# Patient Record
Sex: Male | Born: 1940 | Race: White | Hispanic: No | Marital: Married | Smoking: Never smoker
Health system: Southern US, Community
[De-identification: ages and names within clinical notes are randomized; demographics above are authoritative.]

## PROBLEM LIST (undated history)

## (undated) DIAGNOSIS — Z86010 Personal history of colonic polyps: Principal | ICD-10-CM

## (undated) DIAGNOSIS — T7840XA Allergy, unspecified, initial encounter: Secondary | ICD-10-CM

## (undated) DIAGNOSIS — M1990 Unspecified osteoarthritis, unspecified site: Secondary | ICD-10-CM

## (undated) DIAGNOSIS — I639 Cerebral infarction, unspecified: Secondary | ICD-10-CM

## (undated) DIAGNOSIS — I499 Cardiac arrhythmia, unspecified: Secondary | ICD-10-CM

## (undated) DIAGNOSIS — E039 Hypothyroidism, unspecified: Secondary | ICD-10-CM

## (undated) DIAGNOSIS — F419 Anxiety disorder, unspecified: Secondary | ICD-10-CM

## (undated) DIAGNOSIS — K219 Gastro-esophageal reflux disease without esophagitis: Secondary | ICD-10-CM

## (undated) DIAGNOSIS — I509 Heart failure, unspecified: Secondary | ICD-10-CM

## (undated) DIAGNOSIS — I10 Essential (primary) hypertension: Secondary | ICD-10-CM

## (undated) DIAGNOSIS — E785 Hyperlipidemia, unspecified: Secondary | ICD-10-CM

## (undated) DIAGNOSIS — I493 Ventricular premature depolarization: Secondary | ICD-10-CM

## (undated) DIAGNOSIS — Q159 Congenital malformation of eye, unspecified: Secondary | ICD-10-CM

## (undated) DIAGNOSIS — E079 Disorder of thyroid, unspecified: Secondary | ICD-10-CM

## (undated) DIAGNOSIS — F32A Depression, unspecified: Secondary | ICD-10-CM

## (undated) DIAGNOSIS — C801 Malignant (primary) neoplasm, unspecified: Secondary | ICD-10-CM

## (undated) DIAGNOSIS — K200 Eosinophilic esophagitis: Secondary | ICD-10-CM

## (undated) DIAGNOSIS — K5790 Diverticulosis of intestine, part unspecified, without perforation or abscess without bleeding: Secondary | ICD-10-CM

## (undated) DIAGNOSIS — I420 Dilated cardiomyopathy: Secondary | ICD-10-CM

## (undated) DIAGNOSIS — F329 Major depressive disorder, single episode, unspecified: Secondary | ICD-10-CM

## (undated) HISTORY — DX: Major depressive disorder, single episode, unspecified: F32.9

## (undated) HISTORY — PX: APPENDECTOMY: SHX54

## (undated) HISTORY — DX: Anxiety disorder, unspecified: F41.9

## (undated) HISTORY — PX: COLONOSCOPY: SHX174

## (undated) HISTORY — DX: Depression, unspecified: F32.A

## (undated) HISTORY — DX: Gastro-esophageal reflux disease without esophagitis: K21.9

## (undated) HISTORY — DX: Personal history of colonic polyps: Z86.010

## (undated) HISTORY — DX: Cerebral infarction, unspecified: I63.9

## (undated) HISTORY — DX: Essential (primary) hypertension: I10

## (undated) HISTORY — DX: Hyperlipidemia, unspecified: E78.5

## (undated) HISTORY — PX: TONSILLECTOMY: SUR1361

## (undated) HISTORY — PX: SHOULDER ARTHROSCOPY: SHX128

## (undated) HISTORY — DX: Eosinophilic esophagitis: K20.0

## (undated) HISTORY — DX: Congenital malformation of eye, unspecified: Q15.9

## (undated) HISTORY — PX: SHOULDER SURGERY: SHX246

## (undated) HISTORY — DX: Allergy, unspecified, initial encounter: T78.40XA

## (undated) HISTORY — DX: Dilated cardiomyopathy: I42.0

## (undated) HISTORY — DX: Unspecified osteoarthritis, unspecified site: M19.90

## (undated) HISTORY — PX: KNEE ARTHROSCOPY: SUR90

## (undated) HISTORY — DX: Disorder of thyroid, unspecified: E07.9

---

## 1999-07-25 ENCOUNTER — Emergency Department (HOSPITAL_COMMUNITY): Admission: EM | Admit: 1999-07-25 | Discharge: 1999-07-25 | Payer: Self-pay | Admitting: Emergency Medicine

## 1999-12-02 ENCOUNTER — Encounter: Admission: RE | Admit: 1999-12-02 | Discharge: 1999-12-02 | Payer: Self-pay | Admitting: Sports Medicine

## 2006-02-23 ENCOUNTER — Emergency Department (HOSPITAL_COMMUNITY): Admission: EM | Admit: 2006-02-23 | Discharge: 2006-02-23 | Payer: Self-pay | Admitting: Emergency Medicine

## 2006-02-26 ENCOUNTER — Ambulatory Visit (HOSPITAL_COMMUNITY): Admission: RE | Admit: 2006-02-26 | Discharge: 2006-02-26 | Payer: Self-pay | Admitting: Internal Medicine

## 2006-06-29 ENCOUNTER — Ambulatory Visit (HOSPITAL_BASED_OUTPATIENT_CLINIC_OR_DEPARTMENT_OTHER): Admission: RE | Admit: 2006-06-29 | Discharge: 2006-06-29 | Payer: Self-pay | Admitting: Orthopedic Surgery

## 2006-09-18 ENCOUNTER — Ambulatory Visit: Payer: Self-pay | Admitting: Internal Medicine

## 2006-10-02 ENCOUNTER — Ambulatory Visit: Payer: Self-pay | Admitting: Internal Medicine

## 2007-05-15 ENCOUNTER — Encounter: Admission: RE | Admit: 2007-05-15 | Discharge: 2007-05-15 | Payer: Self-pay | Admitting: Otolaryngology

## 2007-06-05 ENCOUNTER — Inpatient Hospital Stay (HOSPITAL_COMMUNITY): Admission: EM | Admit: 2007-06-05 | Discharge: 2007-06-07 | Payer: Self-pay | Admitting: Emergency Medicine

## 2007-06-06 ENCOUNTER — Encounter (INDEPENDENT_AMBULATORY_CARE_PROVIDER_SITE_OTHER): Payer: Self-pay | Admitting: Emergency Medicine

## 2009-02-03 ENCOUNTER — Telehealth: Payer: Self-pay | Admitting: Internal Medicine

## 2009-02-03 ENCOUNTER — Encounter: Payer: Self-pay | Admitting: Internal Medicine

## 2009-02-03 ENCOUNTER — Ambulatory Visit: Payer: Self-pay | Admitting: Gastroenterology

## 2009-02-03 DIAGNOSIS — K219 Gastro-esophageal reflux disease without esophagitis: Secondary | ICD-10-CM | POA: Insufficient documentation

## 2009-02-03 DIAGNOSIS — R1319 Other dysphagia: Secondary | ICD-10-CM | POA: Insufficient documentation

## 2009-02-04 ENCOUNTER — Ambulatory Visit: Payer: Self-pay | Admitting: Internal Medicine

## 2009-02-04 HISTORY — PX: UPPER GASTROINTESTINAL ENDOSCOPY: SHX188

## 2009-02-08 DEATH — deceased

## 2009-02-24 ENCOUNTER — Ambulatory Visit: Payer: Self-pay | Admitting: Internal Medicine

## 2009-04-06 ENCOUNTER — Ambulatory Visit: Payer: Self-pay | Admitting: Internal Medicine

## 2009-04-06 ENCOUNTER — Inpatient Hospital Stay (HOSPITAL_COMMUNITY): Admission: EM | Admit: 2009-04-06 | Discharge: 2009-04-08 | Payer: Self-pay | Admitting: Emergency Medicine

## 2009-04-07 ENCOUNTER — Encounter (INDEPENDENT_AMBULATORY_CARE_PROVIDER_SITE_OTHER): Payer: Self-pay | Admitting: Pediatrics

## 2009-04-08 ENCOUNTER — Encounter (INDEPENDENT_AMBULATORY_CARE_PROVIDER_SITE_OTHER): Payer: Self-pay | Admitting: Pediatrics

## 2009-08-04 ENCOUNTER — Telehealth: Payer: Self-pay | Admitting: Internal Medicine

## 2011-01-12 NOTE — Progress Notes (Signed)
Summary: TRIAGE  Phone Note Call from Patient Call back at Home Phone [phone]   Caller: Patient Call For: GESSNER Reason for Call: Talk to Nurse Details for Reason: TRIAGE  Summary of Call: food getting stuck -needs to be seen sooner than 3/17. pt s/w Dr Marina Goodell last night Initial call taken by: Guadlupe Spanish [facility],  February 03, 2009 8:13 AM  Follow-up for Phone Call        patient spoke with Dr Marina Goodell last night after he got choked on some food.  States that for the last few months food is getting stuck when he swallows.  he was switched from Aciphex to Nexium due to insurance coverage and has had problems since.  Will come see Mike Gip, PA this am. Follow-up by: Darcey Nora RN,  February 03, 2009 8:26 AM

## 2011-01-12 NOTE — Miscellaneous (Signed)
Summary: Med list update, on Nexium 40 mg bid samples    Appended Document: Med list update, on Nexium 40 mg bid samples need to check his formulary to see what PPI his insurance favors  Appended Document: Med list update, on Nexium 40 mg bid samples All PPI are on his formulary except generic pantoprazole, omeprazole/Prilosec are preferred  Appended Document: Med list update, on Nexium 40 mg bid samples ok he is to callback with results of Nexium trial

## 2011-01-12 NOTE — Assessment & Plan Note (Signed)
Summary: dysphagia/reflux-sheri   History of Present Illness Visit Type: new patient Primary GI MD: Stan Head MD [facility] Primary Omara Alcon: Rosana Hoes Requesting [place] Ice: n/a Chief Complaint: Complaining of GERD and dysphagia History of Present Illness:   70 YO MALE KNOWN TO DR Leone Payor FROM PRIOR COLONOSCOPY 10/07 WHO COMES IN TODAY AS AN ADD ON  AFTER AN EPISODE LAST NIGHT WITH A TRANSIENT FOOD IMPACTION. HE SAYS HE ATE RICE FOR DINNER AND IT STUCK IN ESOPHAGUS FOR ABOUT 10 MINUTES, GRADUALLY WENT DOWN, BUT A BIT SORE AFTERWARD. PT HAS HAD LONG TERM REFLUX SXS AND HAD BEEN ACIPHEX FOR YEARS TWICE DAILY. HE HAS NOT EVER HAD AN EGD. HE TURNED 67 WAS SWITCHED TO two times a day NEXIUM AT 20 MG IN [place] AND HAS HAD WORSENING REFLUX SXS SINCE. HE SAYS THIS IS NOT WORKING WELL AT ALL. HE HAS HAD INTERMITTENT DYSPHAGIA OVER THE PAST COU[PLE YEARS ,BUT INCREASED FREQUENCY PAST COUPLE MONTHS,AVOIDING CERTAIN MEATS,CORNBREAD ETC.APPETITE FINE, WT STABLE.   GI Review of Systems    Reports acid reflux, belching, dysphagia with solids, and  heartburn.      Denies abdominal pain, bloating, chest pain, dysphagia with liquids, loss of appetite, nausea, vomiting, vomiting blood, and  weight loss.        Denies anal fissure, black tarry stools, change in bowel habit, constipation, diarrhea, diverticulosis, fecal incontinence, heme positive stool, hemorrhoids, irritable bowel syndrome, jaundice, light color stool, liver problems, rectal bleeding, and  rectal pain.   Prior Medications Reviewed Using: List Brought by Patient  Updated Prior Medication List: SYNTHROID 100 MCG TABS (LEVOTHYROXINE SODIUM) 1 by mouth once daily BYSTOLIC 10 MG TABS (NEBIVOLOL HCL) 1 tablet by mouth once daily BAYER ASPIRIN 325 MG TABS (ASPIRIN) 1 by mouth once daily ALPRAZOLAM 0.5 MG TABS (ALPRAZOLAM) take three at bedtime OPTIVAR 0.05 % SOLN (AZELASTINE HCL) once daily NEXIUM 20 MG CPDR (ESOMEPRAZOLE MAGNESIUM) 1 tablet by  mouth two times a day MULTIVITAMINS   TABS (MULTIPLE VITAMIN) once daily CALCIUM-MAGNESIUM 500-250 MG TABS (CALCIUM-MAGNESIUM) 1 by mouth once daily  Current Allergies (reviewed today): No known allergies  Past Medical History:    Reviewed history from 02/03/2009 and no changes required:       Hx of OTHER PREMATURE BEATS (ICD-427.69)       DIVERTICULOSIS, COLON (ICD-562.10)       Hypothyroidism       Hypertension  Past Surgical History:    Reviewed history and no changes required:       Appendectomy  1969       Tonsillectomy    as a child       Left Shoulder surgery x 2   Family History:    Reviewed history and no changes required:       No FH of Colon Cancer:       Family History of Colon Polyps: mother       Family History of Diabetes: mothers side of family  Social History:    Reviewed history and no changes required:       Patient has never smoked.        Alcohol Use - no       Daily Caffeine Use (diet coke)       Illicit Drug Use - no       Occupation: Retired  Risk Factors:  Tobacco use:  never Drug use:  no Alcohol use:  no  Review of Systems  The patient denies allergy/sinus, anemia, anxiety-new, arthritis/joint pain, back  pain, blood in urine, breast changes/lumps, change in vision, confusion, cough, coughing up blood, depression-new, fainting, fatigue, fever, headaches-new, hearing problems, heart murmur, heart rhythm changes, itching, menstrual pain, muscle pains/cramps, night sweats, nosebleeds, pregnancy symptoms, shortness of breath, skin rash, sleeping problems, sore throat, swelling of feet/legs, swollen lymph glands, thirst - excessive , urination - excessive , urination changes/pain, urine leakage, vision changes, and voice change.    Vital Signs:  Patient Profile:   71 Years Old Male Height:     70 inches Weight:      192.25 pounds BMI:     27.68 BSA:     2.05 Pulse rate:   84 / minute Pulse rhythm:   regular BP sitting:   112 / 90  (left  arm)  Vitals Entered By: Merri Ray CMA (February 03, 2009 9:41 AM)                  Physical Exam  General:     Well developed, well nourished, no acute distress. Head:     Normocephalic and atraumatic. Eyes:     PERRLA, no icterus. Lungs:     Clear throughout to auscultation. Heart:     Regular rate and rhythm; no murmurs, rubs,  or bruits. 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 SCHEDULE FOR EGD WITH SAVARY DILATION WITH DR Marijo Conception SOON AS POSSIBLE;PROCEDURE AND RISK DISCUSSED WITH PT. CHANGE NEXIUM TO 40 MG TWICE DAILY (SAMPLES GIVEN FOR 2 WEEK TRIAL, IF NOT EFFECTIVE WILL SWITCH TO ANOTHER PPI)  REMAIN ON A SOFT DIET WITHOUT MEATS, BREADS OR RICE UNTIL AFTER EGD.  Problem # 2:  GERD (ICD-530.81) Assessment: Deteriorated POORLY CONTROLLED WITH CURRENT REGIMEN, INCREASE NEXIUM TO 40 MG TWICE DAILY. Orders: EGD (EGD)   Patient Instructions: 1)  We have provided Nexium 40 mg samples for you. Take 1 tab 40 mg twice daily.  (30 min prior to breakfast and dinner.) 2)  Call us in 1-2  weeks to let us know how the nexium twice daily is working.  We can call in a prescription for you if it is working well. 3)  You are scheduled you for an Endoscopy with Dr Stan Head for tomorrow 02-04-09. Please arrive to our 4th floor @ 7:30Am.  4)  Follow the instructions we have given you.  5)  Copy Sent To: Marijo File MD

## 2011-01-12 NOTE — Procedures (Signed)
Summary: EGD   EGD  Procedure date:  02/04/2009  Findings:      Location: [facility]    ENDOSCOPY PROCEDURE REPORT  PATIENT:  Caton, Popowski  MR#:  [id number] BIRTHDATE:   Apr 24, 1941   GENDER:   male  ENDOSCOPIST:   Iva Boop, MD, [facility]    PROCEDURE DATE:  02/04/2009 PROCEDURE:  EGD, diagnostic, [name] Dilation of Esophagus ASA CLASS:   Class II INDICATIONS: dysphagia   MEDICATIONS:    Fentanyl 75 mcg IV, Versed 8 mg IV TOPICAL ANESTHETIC:   Exactacain Spray  DESCRIPTION OF PROCEDURE:   After the risks benefits and alternatives of the procedure were thoroughly explained, informed consent was obtained.  The LB GIF-H180 [id number] endoscope was introduced through the mouth and advanced to the second portion of the duodenum, without limitations.  The instrument was slowly withdrawn as the mucosa was fully examined. <<PROCEDUREIMAGES>>      <<OLD IMAGES>>  The upper, middle, and distal third of the esophagus were carefully inspected and no abnormalities were noted. The z-line was well seen at the GEJat 40 cm. The endoscope was pushed into the fundus which was normal including a retroflexed view. The antrum,gastric body, first and second part of the duodenum were unremarkable. 54 French Maloney dilator was passed with mild resistance and no heme, given history of dysphagia.    Retroflexed views revealed no abnormalities.    The scope was then withdrawn from the patient and the procedure completed.  COMPLICATIONS:   None  ENDOSCOPIC IMPRESSION:  1) Normal EGD, 54 French Maloney dilator passed due to dysphagia. RECOMMENDATIONS:  1) Clear liquids until, then soft foods rest iof day. Resume prior diet tomorrow.  2) follow-up: GI clinic 4 - 6 week(s), call for appointment  Stay on Nexium 40 mg 2 times a day for now, we will check formulary and prescribe after that.  REPEAT EXAM:   No   Iva Boop, MD, [facility]    CC: Chilton Greathouse MD The Patient

## 2011-01-12 NOTE — Assessment & Plan Note (Signed)
Summary: F-UP MEDS/FH   History of Present Illness Visit Type:  Follow-up Visit Primary GI MD:  Stan Head MD [facility] Primary MD:  Larina Earthly, MD Referral MD:  n/a Chief Complaint:  f/u EGD, dysphagia with some brown rice. History of Present Illness:  No heartburn. Only 1 episode of dysphagia.            Current Medications (verified): 1)  Synthroid 100 Mcg Tabs (Levothyroxine Sodium) .Marland Kitchen.. 1 By Mouth Once Daily 2)  Bystolic 10 Mg Tabs (Nebivolol Hcl) .Marland Kitchen.. 1 Tablet By Mouth Once Daily 3)  Bayer Aspirin 325 Mg Tabs (Aspirin) .Marland Kitchen.. 1 By Mouth Once Daily 4)  Alprazolam 0.5 Mg Tabs (Alprazolam) .... Take Three At Bedtime 5)  Optivar 0.05 % Soln (Azelastine Hcl) .... Once Daily 6)  Nexium 40 Mg  Cpdr (Esomeprazole Magnesium) .Marland Kitchen.. 1 Capsule Twice A Day 30 Minutes Before Meals 7)  Multivitamins   Tabs (Multiple Vitamin) .... Once Daily 8)  Calcium-Magnesium 500-250 Mg Tabs (Calcium-Magnesium) .Marland Kitchen.. 1 By Mouth Once Daily  Allergies (verified): No Known Drug Allergies  Past History:  Past Medical History:    Hypothyroidism    Hypertension    GERD/Dysphagia    Allergic conjuctivitis    Anxiety  Past Surgical History:    Reviewed history from 02/03/2009 and no changes required:    Appendectomy  1969    Tonsillectomy    as a child    Left Shoulder surgery x 2  Family History:    Reviewed history from 02/03/2009 and no changes required:       No FH of Colon Cancer:       Family History of Colon Polyps: mother       Family History of Diabetes: mothers side of family  Social History:    Reviewed history from 02/03/2009 and no changes required:       Patient has never smoked.        Alcohol Use - no       Daily Caffeine Use (diet coke)       Illicit Drug Use - no       Occupation: Retired  Vital Signs:  Patient profile:   70 year old male Height:      70 inches Weight:      191.13 pounds BMI:     27.52 Pulse rate:   60 / minute Pulse rhythm:   regular BP sitting:    134 / 80  (left arm) Cuff size:   regular  Vitals Entered By: Christie Nottingham CMA (February 24, 2009 10:16 AM)  Physical Exam  General:  Well developed, well nourished, no acute distress.   Impression & Recommendations:  Problem # 1:  GERD (ICD-530.81) Assessment Improved Not sure he needs two times a day Nexium To try 40 mg once daily and f/u 2 mos If worse in between to try Pepcid complete or call back  Problem # 2:  DYSPHAGIA (BJY-782.95) Assessment: Improved 1 episode since 54 Fr Maloney dilation  02/04/09. Will monitor  Patient Instructions: 1)  Please pick up your medications at your pharmacy. 2)  If the 1x a day Nexium is not working well call me, if you have minor infrequent heartburn symptoms Pepcid Complete over the counter can help. 3)  Please schedule a follow-up appointment in 2 months.  4)  Copy sent to : Dr. Elvera Lennox. Avva 5)  The medication list was reviewed and reconciled.  All changed / newly prescribed medications were explained.  A complete  medication list was provided to the patient / caregiver. Prescriptions: NEXIUM 40 MG  CPDR (ESOMEPRAZOLE MAGNESIUM) 1 capsule once a day 30 minutes before breakfast  #30 x 3   Entered and Authorized by:   Iva Boop MD   Signed by:   Iva Boop MD on 02/24/2009   Method used:   Electronically to        [facility]  [place] [address] (retail)       [address]       [place], [place]  [address]       Ph: [phone] or [phone]       Fax: [phone]   RxID:   [id number]

## 2011-01-12 NOTE — Procedures (Signed)
Summary: Colonoscopy   Colonoscopy  Procedure date:  10/02/2006  Findings:      Location:  [facility].    Procedures Next Due Date:    Colonoscopy: 10/2011  Colonoscopy  Procedure date:  10/02/2006  Findings:      Location:  [facility].    Patient Name: John Mack, John Mack MRN:  Procedure Procedures: Colonoscopy CPT: [phone].  Personnel: Endoscopist: Iva Boop, MD, [facility].  Referred By: Serafina Royals Felipa Eth, MD.  Exam Location: Exam performed in Outpatient Clinic. Outpatient  Patient Consent: Procedure, Alternatives, Risks and Benefits discussed, consent obtained, from patient. Consent was obtained by the RN.  Indications  Average Risk Screening Routine.  History  Current Medications: Patient is not currently taking Coumadin.  Allergies: No known allergies.  Pre-Exam Physical: Performed Oct 02, 2006. Cardio-pulmonary exam, Rectal exam, HEENT exam , Abdominal exam, Mental status exam WNL.  Comments: Pt. history reviewed/updated, physical exam performed prior to initiation of sedation? YES Prostate is normal Exam Exam: Extent of exam reached: Cecum, extent intended: Cecum.  The cecum was identified by appendiceal orifice and IC valve. Patient position: left side to back. Time to Cecum: 00:10:23. Time for Withdrawl: 00: 11:39. Colon retroflexion performed. Images taken. ASA Classification: II. Tolerance: good.  Monitoring: Pulse and BP monitoring, Oximetry used. Supplemental O2 given.  Colon Prep Used MiraLax for colon prep. Prep results: fair, adequate exam.  Sedation Meds: Patient assessed and found to be appropriate for moderate (conscious) sedation. Fentanyl 75 mcg. given IV. Versed 8 mg. given IV.  Findings - DIVERTICULOSIS: Descending Colon to Sigmoid Colon. ICD9: Diverticulosis, Colon: 562.10.  - NORMAL EXAM: Cecum to Descending Colon.  NORMAL EXAM: Rectum.    Comments: Spasm and deep folds in some  areas Assessment  Diagnoses: 562.10: Diverticulosis, Colon.   Comments: 1) NO POLYPS OR CANCER SEEN 2) DIVERTICULOSIS 3) FAIR, ADEQUATE PREP 4) 11 MIN 39 SEC WITHDRAWAL TIME Events  Unplanned Interventions: No intervention was required.  Plans Disposition: After procedure patient sent to recovery. After recovery patient sent home.  Scheduling/Referral: Colonoscopy, to Iva Boop, MD, [facility], 5 yrs due to prep and deep folds,  Primary Care Provider, to Ravisankar R. Avva, MD, as planned,    cc: Thedora Hinders, MD    This report was created from the original endoscopy report, which was reviewed and signed by the above listed endoscopist.

## 2011-01-12 NOTE — Progress Notes (Signed)
Summary: Med Refill  Phone Note Call from Patient Call back at Home Phone [phone]   Caller: Patient Call For: Dr Leone Payor Reason for Call: Refill Medication Details for Reason: Med Refill Summary of Call: Pt called again re: refill on nexium; Wanted to reiterate that he is not having any new symptoms and just needs refill.  Initial call taken by: Dwan Bolt,  August 04, 2009 8:43 AM  Follow-up for Phone Call        I spoke to pt who states that he is doing well.  He is not having any problems swallowing.  He is taking Nexium once daily.  Pt wants to know if he may have a refill without a visit.  When Mr. Guardiola was seen in March he was told to return in May for follow up. Follow-up by: Francee Piccolo CMA Duncan Dull),  August 04, 2009 10:21 AM  Additional Follow-up for Phone Call Additional follow up Details #1::        sounds like he is ok so refill x 1 year and he needs to call back if having dysphagia, heartburn problems if he is ok and wantd to get Nexium from Dr. Felipa Eth that should be ok to do in future but Dr. Felipa Eth will need to take over the Rx then Additional Follow-up by: Iva Boop MD, [facility],  August 04, 2009 10:53 AM    Additional Follow-up for Phone Call Additional follow up Details #2::    Notified pt OK to refill.  Instructed pt to call if he is having any problems swallowing or heartburn.  Also advised pt that he may get refills from Dr. Felipa Eth after one year if Dr. Felipa Eth takes over rx.  Pt voices understanding.  Refills for one year sent via seperate eSM document. Follow-up by: Francee Piccolo CMA (AAMA),  August 04, 2009 11:00 AM

## 2011-01-12 NOTE — Procedures (Signed)
Summary: Form Advanced Directives  Gastroenterology-Advanced Directrives   Imported By: Lowry Ram CMA 02/03/2009 10:41:07  _____________________________________________________________________  External Attachment:    Type:   Image     Comment:   External Document

## 2011-01-24 ENCOUNTER — Encounter: Payer: Self-pay | Admitting: Family Medicine

## 2011-01-24 ENCOUNTER — Ambulatory Visit (INDEPENDENT_AMBULATORY_CARE_PROVIDER_SITE_OTHER): Payer: Medicare Other | Admitting: Family Medicine

## 2011-01-24 DIAGNOSIS — IMO0002 Reserved for concepts with insufficient information to code with codable children: Secondary | ICD-10-CM

## 2011-02-01 NOTE — Assessment & Plan Note (Signed)
Summary: STRAINED GROIN/LP   Vital Signs:  Patient profile:   69 year old male Height:      70 inches (177.80 cm) Weight:      188.8 pounds (85.82 kg) BMI:     27.19 Temp:     98.2 degrees F (36.78 degrees C) oral Pulse rate:   66 / minute BP sitting:   143 / 90  (right arm)  Vitals Entered By: Baxter Hire) (January 24, 2011 10:01 AM) CC: Strained groin Pain Assessment Patient in pain? yes     Location: groin Intensity: 2 Onset of pain  pain has gotten better Nutritional Status BMI of 25 - 29 = overweight  Does patient need assistance? Functional Status Self care Ambulation Normal   Primary Care Provider:  Larina Earthly, MD  CC:  Strained groin.  History of Present Illness: 70 yo M here for right groin pain  Patient states after running about 2 weeks ago he developed right groin pain This run was different in that he tried to pick up his pace toward the end of the run during a 7 miler. Pain the next time started at about the 5 mile mark and persisted through end of the run though he was able to finish. Has tried icy hot, relative rest since then Has gone back to running but feels pain return when trying to speed up. Pain has significantly improved however - was able to run 5 miles at a slower pace without pain. Not doing any specific exercises, taking oral medication.  Habits & Providers  Alcohol-Tobacco-Diet     Alcohol drinks/day: 0     Tobacco Status: never  Medications Prior to Update: 1)  Synthroid 100 Mcg Tabs (Levothyroxine Sodium) .Marland Kitchen.. 1 By Mouth Once Daily 2)  Bystolic 10 Mg Tabs (Nebivolol Hcl) .Marland Kitchen.. 1 Tablet By Mouth Once Daily 3)  Bayer Aspirin 325 Mg Tabs (Aspirin) .Marland Kitchen.. 1 By Mouth Once Daily 4)  Alprazolam 0.5 Mg Tabs (Alprazolam) .... Take Three At Bedtime 5)  Optivar 0.05 % Soln (Azelastine Hcl) .... Once Daily 6)  Nexium 40 Mg  Cpdr (Esomeprazole Magnesium) .Marland Kitchen.. 1 Capsule Once A Day 30 Minutes Before Breakfast 7)  Multivitamins   Tabs  (Multiple Vitamin) .... Once Daily 8)  Calcium-Magnesium 500-250 Mg Tabs (Calcium-Magnesium) .Marland Kitchen.. 1 By Mouth Once Daily  Allergies (verified): No Known Drug Allergies  Family History: Reviewed history from 02/03/2009 and no changes required. No FH of Colon Cancer: Family History of Colon Polyps: mother Family History of Diabetes: mothers side of family  Social History: Reviewed history from 02/03/2009 and no changes required. Patient has never smoked.  Alcohol Use - no Daily Caffeine Use (diet coke) Illicit Drug Use - no Occupation: Retired  Physical Exam  General:  Well developed, well nourished, no acute distress. Msk:  R leg: No gross deformity, swelling, bruising. Minimal TTP right adductors near insertion proximally. Strength 5/5 hip flexion, abduction, adduction, ext rotation. Mild pain with adduction testing but 5/5 strength. FROM R hip. Negative log roll bilateral hips NVI distally.    Impression & Recommendations:  Problem # 1:  GROIN STRAIN, RIGHT (ICD-848.8) Assessment New Nearly resolved.  Shown adduction and standing hip rotation exercises to do over next 4 weeks regularly.  Ok to continue running but no sprinting and no cutting over next 4 weeks.  Heat, compression shorts discussed.  F/u as needed.  Complete Medication List: 1)  Synthroid 100 Mcg Tabs (Levothyroxine sodium) .Marland Kitchen.. 1 by mouth once daily 2)  Bystolic 10 Mg Tabs (Nebivolol hcl) .Marland Kitchen.. 1 tablet by mouth once daily 3)  Bayer Aspirin 325 Mg Tabs (Aspirin) .Marland Kitchen.. 1 by mouth once daily 4)  Alprazolam 0.5 Mg Tabs (Alprazolam) .... Take three at bedtime 5)  Optivar 0.05 % Soln (Azelastine hcl) .... Once daily 6)  Nexium 40 Mg Cpdr (Esomeprazole magnesium) .Marland Kitchen.. 1 capsule once a day 30 minutes before breakfast 7)  Multivitamins Tabs (Multiple vitamin) .... Once daily 8)  Calcium-magnesium 500-250 Mg Tabs (Calcium-magnesium) .Marland Kitchen.. 1 by mouth once daily  Patient Instructions: 1)  You have a groin  (adductor) strain. 2)  It's ok to continue running as you have been. 3)  Avoid sprinting and side to side running for another 4 weeks. 4)  Start adduction exercise and standing hip rotations - 3 sets of 8-10 once a day for the next 4 weeks. 5)  Add an ankle weight of 2-3 pounds if these become too easy. 6)  Compression shorts are a good idea for this injury. 7)  Heat tends to be better for muscle strains with ice after you run. 8)  Follow up with me as needed.   Orders Added: 1)  New Patient Level III [04540]

## 2011-03-22 LAB — POCT I-STAT, CHEM 8
BUN: 9 mg/dL (ref 6–23)
Calcium, Ion: 1.02 mmol/L — ABNORMAL LOW (ref 1.12–1.32)
Chloride: 101 mEq/L (ref 96–112)
Creatinine, Ser: 0.9 mg/dL (ref 0.4–1.5)
Glucose, Bld: 103 mg/dL — ABNORMAL HIGH (ref 70–99)
HCT: 44 % (ref 39.0–52.0)
Hemoglobin: 15 g/dL (ref 13.0–17.0)
Potassium: 3.5 mEq/L (ref 3.5–5.1)
Sodium: 132 mEq/L — ABNORMAL LOW (ref 135–145)
TCO2: 23 mmol/L (ref 0–100)

## 2011-03-22 LAB — DIFFERENTIAL
Basophils Absolute: 0 10*3/uL (ref 0.0–0.1)
Basophils Relative: 0 % (ref 0–1)
Eosinophils Absolute: 0.2 10*3/uL (ref 0.0–0.7)
Eosinophils Relative: 5 % (ref 0–5)
Lymphocytes Relative: 25 % (ref 12–46)
Lymphs Abs: 1.3 10*3/uL (ref 0.7–4.0)
Monocytes Absolute: 0.5 10*3/uL (ref 0.1–1.0)
Monocytes Relative: 9 % (ref 3–12)
Neutro Abs: 3.1 10*3/uL (ref 1.7–7.7)
Neutrophils Relative %: 61 % (ref 43–77)

## 2011-03-22 LAB — COMPREHENSIVE METABOLIC PANEL
ALT: 11 U/L (ref 0–53)
AST: 27 U/L (ref 0–37)
Albumin: 4 g/dL (ref 3.5–5.2)
Alkaline Phosphatase: 47 U/L (ref 39–117)
BUN: 8 mg/dL (ref 6–23)
CO2: 25 mEq/L (ref 19–32)
Calcium: 8.9 mg/dL (ref 8.4–10.5)
Chloride: 99 mEq/L (ref 96–112)
Creatinine, Ser: 0.73 mg/dL (ref 0.4–1.5)
GFR calc Af Amer: >60 mL/min (ref >60)
GFR calc non Af Amer: >60 mL/min (ref >60)
Glucose, Bld: 100 mg/dL — ABNORMAL HIGH (ref 70–99)
Potassium: 3.5 mEq/L (ref 3.5–5.1)
Sodium: 130 mEq/L — ABNORMAL LOW (ref 135–145)
Total Bilirubin: 0.8 mg/dL (ref 0.3–1.2)
Total Protein: 6.4 g/dL (ref 6.0–8.3)

## 2011-03-22 LAB — GLUCOSE, CAPILLARY: Glucose-Capillary: 114 mg/dL — ABNORMAL HIGH (ref 70–99)

## 2011-03-22 LAB — LIPID PANEL
Cholesterol: 174 mg/dL (ref 0–200)
Cholesterol: 199 mg/dL (ref 0–200)
HDL: 30 mg/dL — ABNORMAL LOW (ref >39)
HDL: 36 mg/dL — ABNORMAL LOW (ref >39)
LDL Cholesterol: 125 mg/dL — ABNORMAL HIGH (ref 0–99)
LDL Cholesterol: 150 mg/dL — ABNORMAL HIGH (ref 0–99)
Total CHOL/HDL Ratio: 5.5 RATIO
Total CHOL/HDL Ratio: 5.8 RATIO
Triglycerides: 66 mg/dL (ref <150)
Triglycerides: 96 mg/dL (ref <150)
VLDL: 13 mg/dL (ref 0–40)
VLDL: 19 mg/dL (ref 0–40)

## 2011-03-22 LAB — CBC
HCT: 43.1 % (ref 39.0–52.0)
Hemoglobin: 14.7 g/dL (ref 13.0–17.0)
MCHC: 34.2 g/dL (ref 30.0–36.0)
MCV: 90.7 fL (ref 78.0–100.0)
Platelets: 204 10*3/uL (ref 150–400)
RBC: 4.75 MIL/uL (ref 4.22–5.81)
RDW: 13.4 % (ref 11.5–15.5)
WBC: 5.2 10*3/uL (ref 4.0–10.5)

## 2011-03-22 LAB — HEMOGLOBIN A1C
Hgb A1c MFr Bld: 5.6 % (ref 4.6–6.1)
Hgb A1c MFr Bld: 5.7 % (ref 4.6–6.1)
Mean Plasma Glucose: 114 mg/dL
Mean Plasma Glucose: 117 mg/dL

## 2011-03-22 LAB — PROTIME-INR
INR: 1.1 (ref 0.00–1.49)
Prothrombin Time: 14.1 seconds (ref 11.6–15.2)

## 2011-03-22 LAB — CK TOTAL AND CKMB (NOT AT ARMC)
CK, MB: 3.6 ng/mL (ref 0.3–4.0)
Relative Index: 2.1 (ref 0.0–2.5)
Total CK: 175 U/L (ref 7–232)

## 2011-03-22 LAB — T4, FREE: Free T4: 1.17 ng/dL (ref 0.80–1.80)

## 2011-03-22 LAB — HOMOCYSTEINE
Homocysteine: 5.5 umol/L (ref 4.0–15.4)
Homocysteine: 6 umol/L (ref 4.0–15.4)

## 2011-03-22 LAB — TROPONIN I: Troponin I: <0.01 ng/mL (ref 0.00–0.06)

## 2011-03-22 LAB — APTT: aPTT: 27 seconds (ref 24–37)

## 2011-03-22 LAB — TSH: TSH: 2.706 u[IU]/mL (ref 0.350–4.500)

## 2011-03-22 LAB — T3, FREE: T3, Free: 2.4 pg/mL (ref 2.3–4.2)

## 2011-04-25 NOTE — H&P (Signed)
NAME:  John Mack, KEADY NO.:  [id number]   MEDICAL RECORD NO.:  [id number]          PATIENT TYPE:  INP   LOCATION:  1830                         FACILITY:  [facility]   PHYSICIAN:  Gaspar Garbe, M.D.DATE OF BIRTH:  [birth year]   DATE OF ADMISSION:  06/05/2007  DATE OF DISCHARGE:                              HISTORY & PHYSICAL   CHIEF COMPLAINT:  Possible possible TIA.   HISTORY OF PRESENT ILLNESS:  The patient is a 70 year old white male  with a history of seasonal allergic rhinitis and asthma who is currently  getting allergy shots as well as inhalers, as well as hypothyroidism.  The patient indicates that he was driving and in his usual state of  health at 1:30 p.m. when all of a sudden he lost control of his first  and second fingers on his left hand.  He indicated that they drew up in  spasm and that he was having difficulty moving them.  He was also having  numb and tingling feeling in his arm.  This went away after about 5  minutes, and then approximately 15 minutes later came back and lasted  another 15 minutes.  The patient had noted earlier in the day that his  left face felt a little bit numb and he felt that this was getting worse  with this episode as well.  He came to the emergency room at [facility] where he was assessed by the ER physician.  Neurology was  called but this was not thought to be a tPA-requiring event, and I was  asked to come and admit the patient for further observation.   The patient indicates that his left face and his left arm still feel  somewhat numb.  He has soreness in his wrist and forearm on the left  side, but he was able to pass a swallowing evaluation done in the  emergency room and has not shown any other neurologic deficits.  He is  able to walk under his own power.   ALLERGIES:  No known drug allergies.   MEDICATIONS:  1. Synthroid 100 mcg p.o. daily.  2. Aciphex 20 mg p.o. daily.  3. Saline nasal spray twice  daily.  4. Astelin nasal spray twice daily.  5. Nasacort AQ twice daily.  6. Optivar every-other day as needed for eye allergies.  7. Symbicort 160/4.5 two puffs twice daily.  8. ProAir HFA p.r.n.  9. Allergy shots per Dr. Rosemount Callas.  10.Centrum multivitamin.  11.Calcium and magnesium supplement.  12.The patient had been on prednisone for allergy symptoms.  This was      discontinued approximately 1 week ago.   PAST MEDICAL HISTORY:  1. Seasonal allergic rhinitis with asthma as noted above.  2. Hypothyroidism.  3. Gastroesophageal reflux disease.   PAST SURGICAL HISTORY:  The patient had right shoulder repair in 2007 by  Dr. Chaney Malling.   SOCIAL HISTORY:  The patient lives in [place] with his wife and 28-  year-old son and two dogs.  He is retired from a Public librarian.  Is a  nonsmoker, nondrinker.   FAMILY  HISTORY:  Mother died at age 56 status post fall and a resulting  infection.  Father died at age 41 after suicide.  He had a history of  hypertension, degenerative joint disease, and allergies.  He has no  brothers or sisters.   REVIEW OF SYSTEMS:  The patient is negative for fevers, chills, sweats.  Denies any headache, shortness of breath, dyspnea on exertion, chest  pain, GI symptoms, urinary symptoms.  Notes musculoskeletal symptoms and  neurologic symptoms as noted above.  Review of systems is otherwise  unremarkable on a 10-point review.   CODE STATUS:  The patient is a full code.   PHYSICAL EXAMINATION:  VITAL SIGNS:  Temperature 98.0, pulse 88,  respiratory rate 16, blood pressure initially 160/100 which is now  142/78, saturating 99% on room air.  GENERAL:  No acute distress.  HEENT:  Normocephalic, atraumatic.  PERRLA.  EOMI.  ENT is within normal  limits.  NECK:  Supple.  No lymphadenopathy, JVD or bruit.  HEART:  Regular rate and rhythm.  No murmur, rub or gallop are  appreciated.  LUNGS:  Clear to auscultation bilaterally.  ABDOMEN:  Soft, nontender,  normoactive bowel sounds.  No  hepatosplenomegaly is appreciated.  EXTREMITIES:  No clubbing, cyanosis or edema are noted.  MUSCULOSKELETAL:  No joint deformities, spine or CVA tenderness.  NEUROLOGIC:  The patient is oriented to person, place and time, has  cranial nerves II-XII intact with a normal gag reflex and normal speech  swallowing evaluation done in the emergency room as noted.  The patient  indicates a decreased sensation in his left arm and hand up to his  elbow, as well as in the left side of his face.  He has 5/5 strength  bilaterally and no other neurologic deficits noted.   IMAGING:  The patient had a noncontrast CT of his head which was  negative except for right maxillary sinus fluid consistent with  sinusitis versus allergy.  EKG shows left anterior fascicular block.  This was compared to an EKG from 2 years ago available in the office and  not thought to show any evolution, otherwise nonacute.   LABORATORIES:  White count 6.1, hemoglobin 14.6, hematocrit 43.8,  platelets 253.  BUN and creatinine are 13 and 0.7 respectively, glucose  normal at 94.  LFTs are within normal limits.  Coags within normal  limits.  CK-MB and troponin within normal limits.  Urinalysis negative  for infection or protein.   ASSESSMENT AND PLAN:  1. Possible transient ischemic attack.  Did not require tPA.  We will      put him on aspirin and get an MRI, echocardiogram and carotid      Dopplers.  In addition, a fasting lipid and homocystine will be      done in the morning.  Not certain as to whether this is truly a TIA      versus a spasm-type event.  Of note, he has seen Dr. Sandria Manly in the      past for closed head injury but has never had any sort of seizure      disorder.  If this recurs, he may benefit from an EEG testing is      these might be focal type seizures.  We will hydrate the patient      with IV fluid and watch him overnight.  He is to notify the nurses     on 3000 if he has any  further symptoms so that  they can be      witnessed.  2. Hypothyroidism.  Will check his TSH, continue his current dose.  3. Seasonal allergic rhinitis and asthma.  Will not make any changes      to his regimen.  4. Gastroesophageal reflux disease.  Continue him on AcipHex, which      will be substituted with Protonix per hospital protocol.  5. Lovenox for deep venous thrombosis prophylaxis.      Gaspar Garbe, M.D.  Electronically Signed     RWT/MEDQ  D:  06/05/2007  T:  06/06/2007  Job:  [id number]   cc:   Larina Earthly, M.D.

## 2011-04-25 NOTE — H&P (Signed)
NAME:  John Mack, John Mack NO.:  [id number]   MEDICAL RECORD NO.:  [id number]          PATIENT TYPE:  INP   LOCATION:  3111                         FACILITY:  [facility]   PHYSICIAN:  Deanna Artis. Hickling, M.D.DATE OF BIRTH:  May 01, 1941   DATE OF ADMISSION:  04/06/2009  DATE OF DISCHARGE:                              HISTORY & PHYSICAL   CHIEF COMPLAINT:  Left arm posturing weakness, left face weakness, left  face and arm numbness.   John Mack was last known well at 12:30 p.m.  He had had onset of  flexion of his arm and his fist up toward his chest.  He had progression  of his symptoms to involve weakness in his face and numbness in his arm  and his face.   He made a call to his primary physician's office, but the office was  closed and the call was not returned until 2 p.m.  He was advised to  come to the emergency room, and he did so arriving by car at 1453 hours.  His symptoms had improved somewhat, so he was triaged to room and was  not seen until 1513 by Dr. Jana Half.  He assessed the patient, noted that  he had significant left-sided weakness and clumsiness and called a code  stroke at 1520.  I responded to the room before 1530, where CT was  performed at 1535 and reviewed on the spot as normal.   I began my exam at 1547.  A decision was made to give him tPA at 1600  hours based on clumsiness in his left hand and the bolus was pushed at  1614 hours.   PAST MEDICAL HISTORY:  The patient had a similar episode on June 05, 2007.  He presented with left-sided numbness involving the hand and his  face.  He was felt not to be a candidate for tPA was admitted by Dr.  Wylene Simmer.  Workup at that time was essentially negative.  I went up to  see the MRI scan, which reported to show a pixel-size lesion in the  right parietal region.  The signal was not significantly different from  the background and I am skeptical.  The patient made excellent recovery  from his  symptoms.   His risk factors for stroke include a prior TIA-like event,  hypertension, and dyslipidemia.   The patient's other medical problems include cardiac arrhythmia, we do  not know what type; gastroesophageal reflux disease; degenerative joint  disease; hypothyroidism; allergies.   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  The patient has  had episodes of recurrent vertigo and was last seen at [facility] by my partner, Dr.  Jodie Echevaria.  The patient had a  positive Dix-Hallpike test, which produced symptoms of vertigo.  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  Plans  were made to use Brandt-Daroff vertigo exercises.  He was not treated  with the modified Epley maneuver because it was not possible to localize  his symptoms.   His current medications include:  1.  Bystolic 2.5 mg daily.  2. Nexium 40 mg daily.  3. Zocor 40 mg daily.  4. Synthroid 100 mcg daily.  5. Xanax 0.5 mg 3 tablets at bedtime.  6. Centrum tablets 1 daily.  7. Aspirin 81 mg daily.  8. Calcium/magnesium 1 tablet daily.   DRUG ALLERGIES:  None known.   FAMILY HISTORY:  Mother died at age 40 following a fall of complications  of infection.  Father died at age 57 of suicide.  The patient lives in  [place]  with his wife of 29 years.  He is a retired Financial controller in  Designer, fashion/clothing.  He does not use tobacco, alcohol, or drugs.   REVIEW OF SYSTEMS:  GENERAL:  Remarkable for occasional vertigo status  post closed head injury and cardiac arrhythmia.  PULMONARY:  Negative.  GI:  Secondary gastroesophageal reflux disease.  MUSCULOSKELETAL:  DJD.  ALLERGIES:  Allergic rhinitis.  ENDOCRINE:  Positive for hypothyroidism.   PAST SURGICAL HISTORY:  The patient had right shoulder repair in 2007.   PHYSICAL EXAMINATION:  VITAL SIGNS:  Temperature 97.4, blood pressure  162/97, resting pulse 56, respirations 16, oxygen saturation 100%.  He  weighs 180 pounds.  HEAD,  EYES, EARS, NOSE, AND THROAT:  No infections.  NECK:  No bruits, is supple.  Full range of motion.  LUNGS:  Clear.  HEART:  No murmurs.  Pulses normal.  ABDOMEN:  Soft.  Bowel sounds normal.  No hepatosplenomegaly.  SKIN:  Unremarkable.  NEUROLOGIC:  Awake, alert without dysphasia or dysarthria.  Cranial  Nerves:  The patient has a mild left central seventh, basically more  teeth can be seen with a smile on the right side of the face than the  left.  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  His nasolabial folds are  equal.  The rest of his face on the left side is normal.  Visual fields  are full to double simultaneous stimuli.  Extraocular movements full and  conjugate.  Midline tongue.  He has decreased sensation on his left face  in comparison with a right.  Motor examination shows clumsy fine motor  movements and finger apposition with the left hand in comparison with  the right.  He actually had excellent strength in his arms and his legs  and no drift.  Deep tendon reflexes were normal to brisk.  The patient  had bilateral flexor plantar responses, it did not have him get up to  walk.  His NIH stroke scale was 2, but it did not reflect the clumsiness  in his fine motor movements of the left hand, which is the reason, I  decided to give him tPA.   CT scan has been reported as normal.  Sodium 130, potassium 3.5,  chloride 99, CO2 of 25, BUN 8, creatinine 0.73, glucose 100.  Bilirubin  0.8, alkaline phosphatase 47, AST 27, ALT 11.  Total protein 6.4,  albumin 4.0, calcium 8.9.  CK 175, CK-MB 3.8, troponin less than 0.01.  White blood cell count 5200, hemoglobin 14.7, hematocrit 43.1, platelet  count 204,000, MCV 90.7, 61 polys, 25 lymphs, 9 monos, 5 eosinophils.  Prothrombin time 14.1, INR 1.1, PTT 27.  His NIH stroke scale was 2.  Modified  Rankin 0.   We will check his swallowing screen in the emergency room.  He will have  an MRI scan of the brain, MRA intracranial, carotid Doppler, 2-D  echocardiogram, and transcranial Doppler.  He will have laboratory  studies for hemoglobin A1c, fasting lipid panel.  We will treat as  needed for these conditions.  I reassured the patient this is a  small stroke, but even expanded by a few millimeters, it could involve  the his arm and his leg.  I reviewed the MRI scan to make certain that  there was no prior history of amyloid lesions on the gradient echo  focus.  I feel comfortable and gave him tPA in this situation even  though this is fairly mild condition that we see.      Deanna Artis. Sharene Skeans, M.D.  Electronically Signed     WHH/MEDQ  D:  04/06/2009  T:  04/07/2009  Job:  [id number]   cc:   Larina Earthly, M.D.  Elmore Guise., M.D.

## 2011-04-25 NOTE — Discharge Summary (Signed)
NAME:  John Mack, HEMANN NO.:  [id number]   MEDICAL RECORD NO.:  [id number]          PATIENT TYPE:  INP   LOCATION:  3111                         FACILITY:  [facility]   PHYSICIAN:  Pramod P. Pearlean Brownie, MD    DATE OF BIRTH:  02-25-1941   DATE OF ADMISSION:  04/06/2009  DATE OF DISCHARGE:  04/08/2009                               DISCHARGE SUMMARY   DISCHARGE DIAGNOSES:  1. 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  2. Dyslipidemia.  3. Hypertension.  4. Previous transient ischemic attack like event.  5. Cardiac arrhythmia, unknown type.  6. Gastroesophageal reflux disease.  7. Degenerative joint disease.  8. Hypothyroidism.  9. Allergic rhinitis.   DISCHARGE MEDICATIONS:  1. Nexium 40 mg a day.  2. Bystolic 2.5 mg a day.  3. Zocor 20 mg a day.  4. Aspirin 81 mg q.a.m. x2 weeks, then discontinue.  5. Aggrenox 1 p.o. q.p.m. x2 weeks, then increase to b.i.d.  6. Centrum vitamin a day.  7. Xanax 1.5 mg at bedtime p.r.n.  8. Magnesium 500 mg a day.  9. Calcium 250 mg a day.  10.Synthroid 100 mcg a day.  11.Tylenol 2 tablets 1 hour before Aggrenox x1 week, then discontinue.   STUDIES PERFORMED:  1. CT of the brain on admission shows no acute abnormality.  2. MRI of the brain showed atrophy and small vessel disease, remote      deep white matter infarcts, no acute abnormality, and no hemorrhage      from tPA.  3. MRA of the head is negative.  4. 2-D echocardiogram shows a systolic function normal grade 1,      diastolic dysfunction.  No obvious source of embolus.  5. Carotid Doppler shows no ICA stenosis.  6. EKG shows sinus rhythm, normal P axis with left anterior fascicular      block.   LABORATORY STUDIES:  Cholesterol 174, triglycerides 96, HDL 30, and LDL  125.  Homocystine 6.0.  T3 2.4 and T4 1.17.  TSH 2.706.  Hemoglobin A1c  5.7.  CBC normal.  Chemistry with sodium 130, glucose 100,  otherwise  normal.  Liver function tests normal.  Coagulation studies on admission  normal.   HISTORY OF PRESENT ILLNESS:  [name] is a 70 year old right-  handed Caucasian male who had sudden onset of flexion of his arm and his  fist toward his chest.  He had progression of these symptoms to involve  weakness in the face and numbness in his arm and face.  He called his  primary care physician, but the office was closed and call was not  returned until 2:00 p.m.  He was advised to come to the emergency room.  He arrived by car at 2:53 p.m..  His symptoms had improved somewhat but  he was triaged to room and was seen by Dr. Patrica Duel at [phone].  He assessed  the patient noting he had significant left hemiparesis and clumsiness  and called a Code Stroke.  CT of the  head was performed which showed no  acute abnormality.  Decision was made to give him tPA based on  clumsiness in his left hand.  He was given full-dose tPA and admitted to  the ICU for further evaluation.   HOSPITAL COURSE:  MRI was negative for acute stroke.  It was felt that  the patient likely had a small infarct not seen on MRI and the patient  completely resolved.  Complete workup was relatively unrevealing except  for some mild dyslipidemia which the patient has had prior to admission.  He was on Zocor 40 at home and was complaining of muscle aches and pains  and had stopped taking it.  Zocor was resumed at 20 mg an hour and he  has been asked to follow up with his primary care physician.  The  patient was also on aspirin prior to admission and changed to Aggrenox  for secondary stroke prevention in the hospital.  He will alternate  Aggrenox and aspirin for 2 weeks and then increase the Aggrenox b.i.d.  due to side effect of headache.  The patient's NIH stroke scale has  returned to zero and he is safe to discharge home.  He has no PT, OT, or  speech therapy needs at time of discharge.   CONDITION ON DISCHARGE:   The patient is awake, alert, and oriented x3.  No aphasia.  No dysarthria.  Extraocular movements are intact.  Face is  symmetric.  Tongue is midline.  He has no focal deficits.  No sensory  loss.   DISCHARGE PLAN:  1. Discharge home with family.  2. Decrease Zocor due to aches or pain.  Follow up with primary care      physician.  3. Aggrenox for secondary stroke prevention.  4. Follow up Dr. Delia Heady in 2 months.  5. Follow up primary care physician in 1 month.      Annie Main, N.P.    ______________________________  Sunny Schlein. Pearlean Brownie, MD    SB/MEDQ  D:  04/08/2009  T:  04/09/2009  Job:  [id number]   cc:   Larina Earthly, M.D.  Elmore Guise., M.D.

## 2011-04-28 NOTE — Op Note (Signed)
NAME:  John Mack, HOSKIN NO.:  [id number]   MEDICAL RECORD NO.:  [id number]          PATIENT TYPE:  AMB   LOCATION:  DSC                          FACILITY:  [facility]   PHYSICIAN:  Rodney A. Mortenson, M.D.DATE OF BIRTH:  1940/12/27   DATE OF PROCEDURE:  DATE OF DISCHARGE:                                 OPERATIVE REPORT   PREOPERATIVE DIAGNOSIS:  Intra-articular surface, partial tear rotator,  right shoulder, with impingement syndrome, left shoulder.   POSTOPERATIVE DIAGNOSIS:  Intra-articular surface, partial tear rotator,  right shoulder, with impingement syndrome, left shoulder.   OPERATION:  Arthroscopic evaluation of the shoulder; debridement intra-  articular surface tear supraspinatus right shoulder; open acromioplasty and  bursectomy left shoulder.   SURGEON:  Lenard Galloway. Chaney Malling, M.D.   ANESTHESIA:  General.   DESCRIPTION OF PROCEDURE:  The patient was placed on the operating room  table in supine position.  After satisfactory general anesthesia, the  patient was placed in a semi-seated position.  The left upper extremity and  shoulder was prepped with DuraPrep, and draped out in the usual manner.  Through a standard posterior portal, arthroscope was introduced and a  standard arthroscopic surgical port was made anteriorly.  There is good  examination of the shoulder.  The articular cartilage of the humeral head  and the glenoid appeared absolutely normal.  The entire labrum appeared  normal and the superior attachment of the biceps was normal.  As the biceps  was followed out, straight with the cuff, no abnormality about the biceps  could be seen.  No loose bodies were seen.  On the undersurface of the  supraspinatus, adjacent to the biceps is marked fraying and tearing of the  supraspinatus, but this a partial cuff tear.  The arthroscope was introduced  through the anterior portal and this area of fraying and tearing was then  debrided with  arthroscopic shaver.  Excellent decompression was achieved.  Once this was achieved to my satisfaction, the arthroscope was placed in the  subacromial space.  This was cleaned out and a very careful examination of  the rotator cuff was undertaken.  No full-thickness tears were seen.  No  spurring a the AC joint.  The arthroscope was then removed.   A saber-cut incision made over the anterolateral aspect of the shoulder.  Skin edges were retracted.  Bleeders were coagulated.  Deltoid fibers were  released off the anterior aspect of the acromion for about 1 cm.  Skin edges  were retracted.  Bleeders were coagulated.  The subacromial bursa was then  excised.  Using a power saw, an Neer anterior acromioplasty was completed.  Excellent decompression was achieved.  The undersurface was very smooth and  there was a great deal of room for the humeral head to rotate without  impinging.  Throughout the procedure, the shoulder was irrigated with  copious amounts of saline solution.  It was very satisfied with the  decompression.  The deltoid fibers were then reattached with 0-Vicryl.  2-0  Vicryl was used to close the subcutaneous tissue and stainless staples were  used to  close the skin.  Sterile dressings were applied and the patient  returned to the recovery room in excellent condition __________.   FOLLOWUP CARE:  1.  Vicodin for pain.  2.  Sling.  3.  Return to my office on Wednesday for followup exam.           ______________________________  Lenard Galloway. Chaney Malling, M.D.     RAM/MEDQ  D:  06/29/2006  T:  06/29/2006  Job:  [id number]

## 2011-09-27 LAB — COMPREHENSIVE METABOLIC PANEL
ALT: 14
AST: 16
Albumin: 3.7
Alkaline Phosphatase: 46
BUN: 13
CO2: 29
Calcium: 9
Chloride: 101
Creatinine, Ser: 0.78
GFR calc Af Amer: >60
GFR calc non Af Amer: >60
Glucose, Bld: 94
Potassium: 3.8
Sodium: 136
Total Bilirubin: 0.7
Total Protein: 6.7

## 2011-09-27 LAB — URINALYSIS, ROUTINE W REFLEX MICROSCOPIC
Bilirubin Urine: NEGATIVE
Bilirubin Urine: NEGATIVE
Glucose, UA: NEGATIVE
Glucose, UA: NEGATIVE
Hgb urine dipstick: NEGATIVE
Hgb urine dipstick: NEGATIVE
Ketones, ur: NEGATIVE
Ketones, ur: NEGATIVE
Nitrite: NEGATIVE
Nitrite: NEGATIVE
Protein, ur: NEGATIVE
Protein, ur: NEGATIVE
Specific Gravity, Urine: 1.008
Specific Gravity, Urine: 1.009
Urobilinogen, UA: 0.2
Urobilinogen, UA: 0.2
pH: 6.5
pH: 7

## 2011-09-27 LAB — TROPONIN I: Troponin I: 0.01

## 2011-09-27 LAB — TSH: TSH: 2.549

## 2011-09-27 LAB — DIFFERENTIAL
Basophils Absolute: 0
Basophils Relative: 1
Eosinophils Absolute: 0.1
Eosinophils Relative: 2
Lymphocytes Relative: 22
Lymphs Abs: 1.3
Monocytes Absolute: 0.6
Monocytes Relative: 9
Neutro Abs: 4.1
Neutrophils Relative %: 66

## 2011-09-27 LAB — CBC
HCT: 43.8
Hemoglobin: 14.6
MCHC: 33.4
MCV: 89
Platelets: 253
RBC: 4.92
RDW: 13.9
WBC: 6.1

## 2011-09-27 LAB — RAPID URINE DRUG SCREEN, HOSP PERFORMED
Amphetamines: NOT DETECTED
Barbiturates: NOT DETECTED
Benzodiazepines: POSITIVE — AB
Cocaine: NOT DETECTED
Opiates: NOT DETECTED
Tetrahydrocannabinol: NOT DETECTED

## 2011-09-27 LAB — HEMOGLOBIN A1C: Hgb A1c MFr Bld: 5.9

## 2011-09-27 LAB — CARDIAC PANEL(CRET KIN+CKTOT+MB+TROPI)
CK, MB: 1
CK, MB: 1.2
CK, MB: 1.3
Relative Index: INVALID
Relative Index: INVALID
Relative Index: INVALID
Total CK: 47
Total CK: 51
Total CK: 59
Troponin I: 0.01
Troponin I: 0.02
Troponin I: 0.02

## 2011-09-27 LAB — ETHANOL: Alcohol, Ethyl (B): <5

## 2011-09-27 LAB — PROTIME-INR
INR: 1
Prothrombin Time: 12.8

## 2011-09-27 LAB — LIPID PANEL
Cholesterol: 208 — ABNORMAL HIGH
HDL: 43
LDL Cholesterol: 148 — ABNORMAL HIGH
Total CHOL/HDL Ratio: 4.8
Triglycerides: 84
VLDL: 17

## 2011-09-27 LAB — CK TOTAL AND CKMB (NOT AT ARMC)
CK, MB: 2.8
Relative Index: INVALID
Total CK: 86

## 2011-09-27 LAB — HOMOCYSTEINE: Homocysteine: 7.4

## 2011-09-27 LAB — APTT: aPTT: 24

## 2011-11-01 ENCOUNTER — Encounter: Payer: Self-pay | Admitting: Internal Medicine

## 2011-11-08 ENCOUNTER — Telehealth: Payer: Self-pay | Admitting: Internal Medicine

## 2011-11-08 NOTE — Telephone Encounter (Signed)
Patient has a history of dysphagia and esophageal dilations. Last dil in 01/2009 with a [name].   He is due for a recall colon now.  Is it ok to set him up for a double?

## 2011-11-08 NOTE — Telephone Encounter (Signed)
Yes - if having dysphagia arrange EGD and colonoscopy

## 2011-11-10 ENCOUNTER — Encounter: Payer: Self-pay | Admitting: Internal Medicine

## 2011-11-10 NOTE — Telephone Encounter (Signed)
Pt scheduled for double procedure on 12/18/2011 pre-visit on 12/04/2011

## 2011-12-04 ENCOUNTER — Ambulatory Visit (AMBULATORY_SURGERY_CENTER): Payer: No Typology Code available for payment source | Admitting: *Deleted

## 2011-12-04 DIAGNOSIS — R1310 Dysphagia, unspecified: Secondary | ICD-10-CM

## 2011-12-04 DIAGNOSIS — Z1211 Encounter for screening for malignant neoplasm of colon: Secondary | ICD-10-CM

## 2011-12-04 MED ORDER — PEG-KCL-NACL-NASULF-NA ASC-C 100 G PO SOLR: 1.0000 | Freq: Once | ORAL | Status: DC

## 2011-12-15 ENCOUNTER — Telehealth: Payer: Self-pay | Admitting: Internal Medicine

## 2011-12-15 NOTE — Telephone Encounter (Signed)
On Mobic and Flexeril for pulled muscles in back.  Advised it was ok to still have colon.

## 2011-12-18 ENCOUNTER — Ambulatory Visit (AMBULATORY_SURGERY_CENTER): Payer: Medicare Other | Admitting: Internal Medicine

## 2011-12-18 ENCOUNTER — Encounter: Payer: Self-pay | Admitting: Internal Medicine

## 2011-12-18 DIAGNOSIS — R1310 Dysphagia, unspecified: Secondary | ICD-10-CM

## 2011-12-18 DIAGNOSIS — K648 Other hemorrhoids: Secondary | ICD-10-CM

## 2011-12-18 DIAGNOSIS — Z1211 Encounter for screening for malignant neoplasm of colon: Secondary | ICD-10-CM

## 2011-12-18 DIAGNOSIS — D126 Benign neoplasm of colon, unspecified: Secondary | ICD-10-CM

## 2011-12-18 DIAGNOSIS — R1319 Other dysphagia: Secondary | ICD-10-CM

## 2011-12-18 DIAGNOSIS — Z860100 Personal history of colon polyps, unspecified: Secondary | ICD-10-CM | POA: Insufficient documentation

## 2011-12-18 DIAGNOSIS — K5730 Diverticulosis of large intestine without perforation or abscess without bleeding: Secondary | ICD-10-CM

## 2011-12-18 HISTORY — DX: Personal history of colonic polyps: Z86.010

## 2011-12-18 HISTORY — PX: ESOPHAGOSCOPY: SUR460

## 2011-12-18 HISTORY — DX: Personal history of colon polyps, unspecified: Z86.0100

## 2011-12-18 MED ORDER — SODIUM CHLORIDE 0.9 % IV SOLN: 500.0000 mL | INTRAVENOUS | Status: DC

## 2011-12-18 NOTE — Op Note (Signed)
[facility] [address] [place], [place]  [address]  ENDOSCOPY PROCEDURE REPORT  PATIENT:  John Mack, John Mack  MR#:  [id number] BIRTHDATE:  1941/10/13, 70 yrs. old  GENDER:  male  ENDOSCOPIST:  Iva Boop, MD, [facility]  PROCEDURE DATE:  12/18/2011 PROCEDURE:  Esophagoscopy, [name] Dilation of Esophagus ASA CLASS:  Class II INDICATIONS:  dysphagia Recurrent solid dysphagia - previously responded to [facility] dilation in 2010 Nexium controls heartburn  MEDICATIONS:   These medications were titrated to patient response per physician's verbal order, Fentanyl 75 mcg IV, Versed 7 mg IV TOPICAL ANESTHETIC:  Cetacaine Spray  DESCRIPTION OF PROCEDURE:   After the risks benefits and alternatives of the procedure were thoroughly explained, informed consent was obtained.  The LB GIF-H180 [id number] endoscope was introduced through the mouth and advanced to the stomach antrum, without limitations.  The instrument was slowly withdrawn as the mucosa was fully examined. The esophageal photos were omitted inadvertently. <<PROCEDUREIMAGES>>  The upper, middle, and distal third of the esophagus were carefully inspected and no abnormalities were noted. The z-line was well seen at the GEJ at 42 cm. No strictures seen. The endoscope was pushed into the fundus which was normal including a retroflexed view. The antrum and gastric body, were unremarkable. Retroflexed views revealed no abnormalities.    The scope was then withdrawn from the patient, a 74 [place] Maloney dilator was passed without much resistance and there was trace heme seen. The procedure was then completed.  COMPLICATIONS:  None  ENDOSCOPIC IMPRESSION: 1) Normal Esophagoscopy - 54 Fr Maloney dilation performed due to dysphagia RECOMMENDATIONS: post-dilation diet, stay on Nexium  REPEAT EXAM:  as needed  Iva Boop, MD, [facility]  CC:  The Patient Dr. Felipa Eth  n. eSIGNED:   Iva Boop at 12/18/2011 09:34  AM  Georgeanne Nim, [id number]

## 2011-12-18 NOTE — Patient Instructions (Addendum)
The esophagus was dilated today. See if that helps the swallowing and let me know if not. Follow the special diet instructions given today. There was one small polyp, diverticulosis and hemorrhoids on the colonoscopy. Nothing bad seen. The polyp looks benign. Will send you a letter about the pathology results and plans for next routine colonoscopy. Iva Boop, MD, [facility]  Resume your prior medications today. Please call if any questions or concerns.

## 2011-12-18 NOTE — Progress Notes (Signed)
Pt noted to have pac's. Pt states he takes a bystolic for benign irregular beats. He also states that when he gets nervous he has increased amounts of irregularity. ewm  Scope changed after withdrawing from cecum due to scope not working properly. meds titrated per dr Leone Payor. ewm  Pt states he did not take his blood presure meds today as his pressure has been running high al procedure, md aware. Instructed pt to take meds at home please. No further orders given. ewm

## 2011-12-18 NOTE — Progress Notes (Signed)
Pt's abdomen is distended.  I encouraged him to pass flatus.  The pt said he could not get any air out.  I asked him to roll to his right side and tuck his knees up to his abdomen and bear down and push to epxell the air.  MAW  Pt has not had any results with passing the air.  Pt states to his care partner, "I think I'll pea in the bed to give them something to do".  I responded to him that there was no need to pea in the bed I could get him a urnal.  He excepted the urnal and he did pass some gas.  Maw  Per Dr. Leone Payor it is okay for the pt to be discharged with his abdomen being firm.  I encouraged him to continue to pass flatus.  He said he would.  When pressing on his abdomen he did not show any signs of pain or discomfort.  MAW  Patient did not experience any of the following events: a burn prior to discharge; a fall within the facility; wrong site/side/patient/procedure/implant event; or a hospital transfer or hospital admission upon discharge from the facility. [phone]) Patient did not have preoperative order for IV antibiotic SSI prophylaxis. [phone])

## 2011-12-18 NOTE — Op Note (Signed)
[facility] [address] [place], [place]  [address]  COLONOSCOPY PROCEDURE REPORT  PATIENT:  John Mack, John Mack  MR#:  [id number] BIRTHDATE:  1941-04-16, 70 yrs. old  GENDER:  male ENDOSCOPIST:  Iva Boop, MD, [facility]  PROCEDURE DATE:  12/18/2011 PROCEDURE:  Colonoscopy with snare polypectomy ASA CLASS:  Class II INDICATIONS:  Routine Risk Screening returned before 10 years due to deep folds and fair prep last colonoscopy MEDICATIONS:   There was residual sedation effect present from prior procedure., These medications were titrated to patient response per physician's verbal order, Fentanyl 25 mcg IV, Versed 6 mg IV  DESCRIPTION OF PROCEDURE:   After the risks benefits and alternatives of the procedure were thoroughly explained, informed consent was obtained.  Digital rectal exam was performed and revealed no abnormalities and normal prostate.   The LB CF-[id number] endoscope was introduced through the anus and advanced to the cecum, which was identified by both the appendix and ileocecal valve, without limitations.  The quality of the prep was adequate, using MoviPrep.  The instrument was then slowly withdrawn as the colon was fully examined. <<PROCEDUREIMAGES>>  FINDINGS:  A  polyp was found in the sigmoid colon. It was 6 mm in size. Polyp was snared without cautery. Retrieval was successful. Severe diverticulosis was found in the sigmoid colon.  This was otherwise a normal examination of the colon. Includes right colon retroflexion.   Retroflexed views in the rectum revealed internal hemorrhoids.   The first colonoscope malfunctioned (air) and was replaced during the exam.  The time to cecum = 6:29 minutes. The scope was then withdrawn in 11:59 minutes from the cecum and the procedure completed. COMPLICATIONS:  None ENDOSCOPIC IMPRESSION: 1) 6 mm polyp in the sigmoid colon - removed 2) Severe diverticulosis in the sigmoid colon 3) Internal hemorrhoids 4)  Otherwise normal examination, adequate prep  REPEAT EXAM:  In for Colonoscopy, pending biopsy results.  Iva Boop, MD, [facility]  CC:  Chilton Greathouse, MD and The Patient  n. eSIGNED:   Iva Boop at 12/18/2011 09:40 AM  Georgeanne Nim, [phone]

## 2011-12-19 ENCOUNTER — Telehealth: Payer: Self-pay | Admitting: *Deleted

## 2011-12-19 NOTE — Telephone Encounter (Signed)

## 2011-12-21 ENCOUNTER — Encounter: Payer: Self-pay | Admitting: Internal Medicine

## 2011-12-21 NOTE — Progress Notes (Signed)
Quick Note:  6 mm adenoma Routine repaet approx 12/2016 - 5 years ______

## 2012-03-25 ENCOUNTER — Ambulatory Visit (INDEPENDENT_AMBULATORY_CARE_PROVIDER_SITE_OTHER): Payer: Medicare Other | Admitting: Sports Medicine

## 2012-03-25 VITALS — BP 124/78

## 2012-03-25 DIAGNOSIS — S76309A Unspecified injury of muscle, fascia and tendon of the posterior muscle group at thigh level, unspecified thigh, initial encounter: Secondary | ICD-10-CM | POA: Insufficient documentation

## 2012-03-25 DIAGNOSIS — S79919A Unspecified injury of unspecified hip, initial encounter: Secondary | ICD-10-CM

## 2012-03-25 NOTE — Progress Notes (Signed)
  Subjective:    Patient ID: John Mack, male    DOB: [birth year], 71 y.o.   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  He has been training on hills for his next race which is in June.  The pain is worse at the start of the run and improves as he warms up. It also bothers him with prolonged sitting.  He didn't a have specific injury.  The pain has slowly worsened.  He is still able to run, did a 10 miler yesterday.   Review of Systems     Objective:   Physical Exam  Tender to palpation at the ischial tuberosity on the left No tenderness to palpation at the distal hamstring tendon No tenderness to palpation in the muscle belly of the hamstrings No erythema or ecchymosis of the posterior thigh Strength is normal with neutral, inverted, and everted foot He flexes and extends the hip without difficulty  Ultrasound: There is some hypochoeic change at the ischial tuberosity c/w edema in the tendinous attachment but the tendon appears intact.  There is some calcification and spurring here c/w chronic injury. The muscle also appears intact with well organized fibers and no fluid collection.       Assessment & Plan:

## 2012-03-25 NOTE — Patient Instructions (Signed)
Do Hamstring exercises daily  Use small heel lift  Use hamstring compression sleeve for all runs  Let's rescan your hip in 4 weeks

## 2012-03-25 NOTE — Assessment & Plan Note (Signed)
He will wear a compression sleeve with running for warmth and support.  We have also added heel lifts to his running shoes.  Plan to repeat ultrasound on his next visit.

## 2012-04-22 ENCOUNTER — Ambulatory Visit (INDEPENDENT_AMBULATORY_CARE_PROVIDER_SITE_OTHER): Payer: Medicare Other | Admitting: Sports Medicine

## 2012-04-22 ENCOUNTER — Encounter: Payer: Self-pay | Admitting: Sports Medicine

## 2012-04-22 VITALS — BP 143/97 | HR 73

## 2012-04-22 DIAGNOSIS — S79919A Unspecified injury of unspecified hip, initial encounter: Secondary | ICD-10-CM

## 2012-04-22 DIAGNOSIS — S76309A Unspecified injury of muscle, fascia and tendon of the posterior muscle group at thigh level, unspecified thigh, initial encounter: Secondary | ICD-10-CM

## 2012-04-22 DIAGNOSIS — S79929A Unspecified injury of unspecified thigh, initial encounter: Secondary | ICD-10-CM

## 2012-04-22 NOTE — Progress Notes (Signed)
  Subjective:    Patient ID: John Mack, male    DOB: 1941/04/23, 71 y.o.   MRN: [id number]  HPI 71 y/o male is here to follow up on a left hamstring strain.  He is 50% improved since his last visit 4 weeks ago.  He ran 12 miles yesterday with minimal pain wearing his sleeve and running on hard packed dirt.  He has been compliant with his HEP   Review of Systems     Objective:   Physical Exam  No tenderness to palpation at the hamstring insertions No tenderness to palpation in the belly of the muscle Flexes and extends pain free  Ultrasound: The left hamstring was visualized at its insertion onto the ischium.  The fluid collection noted on his last scan is significantly decreased.  The tendon and muscle are intact.       Assessment & Plan:

## 2012-04-22 NOTE — Assessment & Plan Note (Signed)
He is doing well.  He will continue with his HEP and sleeve for running.  We gave him a smaller size today because the other one was slipping off during his run.

## 2012-05-21 ENCOUNTER — Encounter: Payer: Self-pay | Admitting: Family

## 2012-05-21 ENCOUNTER — Ambulatory Visit (INDEPENDENT_AMBULATORY_CARE_PROVIDER_SITE_OTHER): Payer: Medicare Other | Admitting: Family

## 2012-05-21 VITALS — Temp 100.1°F

## 2012-05-21 DIAGNOSIS — J189 Pneumonia, unspecified organism: Secondary | ICD-10-CM

## 2012-05-21 DIAGNOSIS — R509 Fever, unspecified: Secondary | ICD-10-CM

## 2012-05-21 DIAGNOSIS — R05 Cough: Secondary | ICD-10-CM

## 2012-05-21 DIAGNOSIS — R059 Cough, unspecified: Secondary | ICD-10-CM

## 2012-05-21 LAB — CBC WITH DIFFERENTIAL/PLATELET
Basophils Absolute: 0 10*3/uL (ref 0.0–0.1)
Basophils Relative: 0.1 % (ref 0.0–3.0)
Eosinophils Absolute: 0.1 10*3/uL (ref 0.0–0.7)
Eosinophils Relative: 1.4 % (ref 0.0–5.0)
HCT: 43.2 % (ref 39.0–52.0)
Hemoglobin: 14.5 g/dL (ref 13.0–17.0)
Lymphocytes Relative: 6.3 % — ABNORMAL LOW (ref 12.0–46.0)
Lymphs Abs: 0.5 10*3/uL — ABNORMAL LOW (ref 0.7–4.0)
MCHC: 33.5 g/dL (ref 30.0–36.0)
MCV: 91.5 fl (ref 78.0–100.0)
Monocytes Absolute: 0.4 10*3/uL (ref 0.1–1.0)
Monocytes Relative: 4.3 % (ref 3.0–12.0)
Neutro Abs: 7.3 10*3/uL (ref 1.4–7.7)
Neutrophils Relative %: 87.9 % — ABNORMAL HIGH (ref 43.0–77.0)
Platelets: 208 10*3/uL (ref 150.0–400.0)
RBC: 4.72 Mil/uL (ref 4.22–5.81)
RDW: 13.9 % (ref 11.5–14.6)
WBC: 8.3 10*3/uL (ref 4.5–10.5)

## 2012-05-21 LAB — POCT URINALYSIS DIPSTICK
Bilirubin, UA: NEGATIVE
Blood, UA: NEGATIVE
Glucose, UA: NEGATIVE
Ketones, UA: NEGATIVE
Leukocytes, UA: NEGATIVE
Nitrite, UA: NEGATIVE
Protein, UA: NEGATIVE
Spec Grav, UA: 1.015
Urobilinogen, UA: 0.2
pH, UA: 6

## 2012-05-21 MED ORDER — DOXYCYCLINE HYCLATE 100 MG PO TABS: 100.0000 mg | ORAL_TABLET | Freq: Two times a day (BID) | ORAL | Status: AC

## 2012-05-21 MED ORDER — CEFTRIAXONE SODIUM 1 G IJ SOLR
1.0000 g | Freq: Once | INTRAMUSCULAR | Status: AC
  Administered 2012-05-21: 1 g via INTRAMUSCULAR

## 2012-05-21 NOTE — Progress Notes (Signed)
Subjective:    Patient ID: MELO STAUBER, male    DOB: 1941-04-22, 71 y.o.   MRN: [id number]  HPI 71 year old white male, nonsmoker, new patient to the practice is in with complaints of fever, chills that occurred suddenly while he was sitting in the waiting room. He is here today with his wife who is being seen by me. Coincidentally, he became sick. He reports having a cough, with white to yellow mucus production for several months. He's been seeing his PCP regarding that. On 03/18/2012, he was treated for a right upper lobe pneumonia with Levaquin. He has also been given Hycodan syrup. He is scheduled to see pulmonology on 06/18/2012. He had a followup appointment on April 23 that was canceled according to Dr. Vicente Males nurse. Patient denies any lightheadedness, dizziness, chest pain, palpitations, shortness of breath or edema.   Review of Systems  Constitutional: Negative.   HENT: Positive for congestion and postnasal drip.   Respiratory: Positive for cough.   Cardiovascular: Negative.   Genitourinary: Negative.   Musculoskeletal: Negative.   Neurological: Negative.   Hematological: Negative.   Psychiatric/Behavioral: Negative.    Past Medical History  Diagnosis Date  . Allergy   . Anxiety   . Arthritis   . Asthma   . Eye abnormalities     calcium deposits behind eyes  . Depression   . GERD (gastroesophageal reflux disease)   . Hyperlipidemia   . Hypertension   . Stroke     2 TIAs  . Thyroid disease     hypothyroid    History   Social History  . Marital Status: Married    Spouse Name: N/A    Number of Children: N/A  . Years of Education: N/A   Occupational History  . Not on file.   Social History Main Topics  . Smoking status: Never Smoker   . Smokeless tobacco: Never Used  . Alcohol Use: No  . Drug Use: No  . Sexually Active: Not on file   Other Topics Concern  . Not on file   Social History Narrative  . No narrative on file    Past Surgical History    Procedure Date  . Appendectomy   . Tonsillectomy   . Shoulder arthroscopy     left shoulder, both knees  . Shoulder surgery     right surgery  . Knee arthroscopy     both knees  . Colonoscopy 10/02/2006, 12/18/2011  . Upper gastrointestinal endoscopy 02/04/2009    with [facility] dilation  . Esophagoscopy 12/18/2011    with [name] dilation    Family History  Problem Relation Age of Onset  . Colon cancer Neg Hx   . Esophageal cancer Neg Hx   . Stomach cancer Neg Hx   . Rectal cancer Neg Hx     No Known Allergies  Current Outpatient Prescriptions on File Prior to Visit  Medication Sig Dispense Refill  . ALPRAZolam (XANAX) 0.5 MG tablet Take 0.5 mg by mouth daily.        Marland Kitchen BYSTOLIC 2.5 MG tablet 1 tablet Daily.      . cyclobenzaprine (FLEXERIL) 10 MG tablet       . EPIPEN 2-PAK 0.3 MG/0.3ML DEVI as needed.      . fluticasone (FLONASE) 50 MCG/ACT nasal spray Place 1 spray into both nostrils daily.      Marland Kitchen LORazepam (ATIVAN) 1 MG tablet Take 1 mg by mouth every 8 (eight) hours.        Marland Kitchen  meloxicam (MOBIC) 15 MG tablet       . montelukast (SINGULAIR) 10 MG tablet Take 10 mg by mouth daily.      . Multiple Vitamins-Minerals (MULTIVITAMIN PO) Take 1 tablet by mouth daily.        . mupirocin ointment (BACTROBAN) 2 % Apply topically Twice daily.      Marland Kitchen neomycin-polymyxin b-dexamethasone (MAXITROL) 3.5-10000-0.1 OINT Place 1 drop into both eyes Twice daily.      Marland Kitchen NEXIUM 40 MG capsule 1 tablet Daily.      Marland Kitchen SYNTHROID 100 MCG tablet 1 tablet Daily.        Temp(Src) 100.1 F (37.8 C) (Oral)chart    Objective:   Physical Exam  Constitutional: He is oriented to person, place, and time. He appears well-developed and well-nourished.  HENT:  Right Ear: External ear normal.  Left Ear: External ear normal.  Nose: Nose normal.  Mouth/Throat: Oropharynx is clear and moist.  Eyes: Conjunctivae are normal. Pupils are equal, round, and reactive to light.  Neck: Normal range of motion.   Cardiovascular: Normal rate, regular rhythm and normal heart sounds.   Pulmonary/Chest: Effort normal and breath sounds normal.  Neurological: He is alert and oriented to person, place, and time.  Skin: Skin is warm and dry.  Psychiatric: He has a normal mood and affect.          Assessment & Plan:  Assessment: Cough, probable pneumonia, fever, chills  Plan: A gram of Rocephin IM x1 was given. Continue her doxycycline 100 mg twice a day. Chest x-ray of advised, patient report is not going. Advised patient not to drive but he is persistent. I called his daughter and his daughter know what is going on. She plans to meet him at home. Advised patient to follow with his primary care provider tomorrow and sooner when necessary.

## 2012-05-21 NOTE — Patient Instructions (Signed)

## 2012-05-23 ENCOUNTER — Telehealth: Payer: Self-pay | Admitting: Internal Medicine

## 2012-05-23 ENCOUNTER — Ambulatory Visit (INDEPENDENT_AMBULATORY_CARE_PROVIDER_SITE_OTHER)
Admission: RE | Admit: 2012-05-23 | Discharge: 2012-05-23 | Disposition: A | Discharge status: Home / Self Care | Payer: Medicare Other | Source: Ambulatory Visit | Attending: Family | Admitting: Family

## 2012-05-23 DIAGNOSIS — J189 Pneumonia, unspecified organism: Secondary | ICD-10-CM

## 2012-05-23 DIAGNOSIS — R509 Fever, unspecified: Secondary | ICD-10-CM

## 2012-05-23 DIAGNOSIS — R05 Cough: Secondary | ICD-10-CM

## 2012-05-23 DIAGNOSIS — R059 Cough, unspecified: Secondary | ICD-10-CM

## 2012-10-15 ENCOUNTER — Ambulatory Visit: Payer: Medicare Other | Admitting: Sports Medicine

## 2013-06-25 ENCOUNTER — Encounter: Payer: Self-pay | Admitting: *Deleted

## 2013-06-25 NOTE — Progress Notes (Signed)
Subjective:    Patient ID: John Mack is a 72 y.o. male.  HPI  Review of Systems     Objective:  Neurologic Exam    N/A   Physical Exam    N/A  Assessment:    N/A   Plan:     N/A

## 2013-06-25 NOTE — Progress Notes (Signed)
Subjective:    Patient ID: John Mack is a 72 y.o. male.  Cerebrovascular Accident This is a chronic problem. The current episode started more than 1 year ago. The problem has been resolved.   Review of Systems  Unable to perform ROS   Objective:  Neurologic Exam    N/A  Physical Exam  N/A  Assessment:  Participant in the office for his 4th. Annual visit for IRIS study. MMSE was completed without problems.  Blood was drawn and shipped.     Plan:  Participant to continue with follow-up visits per IRIS protocol.

## 2013-07-15 ENCOUNTER — Other Ambulatory Visit: Payer: Self-pay | Admitting: Dermatology

## 2013-09-05 ENCOUNTER — Emergency Department (HOSPITAL_COMMUNITY)
Admission: EM | Admit: 2013-09-05 | Discharge: 2013-09-05 | Disposition: A | Discharge status: Home / Self Care | Payer: No Typology Code available for payment source | Attending: Emergency Medicine | Admitting: Emergency Medicine

## 2013-09-05 ENCOUNTER — Encounter (HOSPITAL_COMMUNITY): Payer: Self-pay | Admitting: *Deleted

## 2013-09-05 DIAGNOSIS — Z8673 Personal history of transient ischemic attack (TIA), and cerebral infarction without residual deficits: Secondary | ICD-10-CM | POA: Insufficient documentation

## 2013-09-05 DIAGNOSIS — F329 Major depressive disorder, single episode, unspecified: Secondary | ICD-10-CM | POA: Insufficient documentation

## 2013-09-05 DIAGNOSIS — S0990XA Unspecified injury of head, initial encounter: Secondary | ICD-10-CM | POA: Insufficient documentation

## 2013-09-05 DIAGNOSIS — J45909 Unspecified asthma, uncomplicated: Secondary | ICD-10-CM | POA: Insufficient documentation

## 2013-09-05 DIAGNOSIS — Z9089 Acquired absence of other organs: Secondary | ICD-10-CM | POA: Insufficient documentation

## 2013-09-05 DIAGNOSIS — I10 Essential (primary) hypertension: Secondary | ICD-10-CM | POA: Insufficient documentation

## 2013-09-05 DIAGNOSIS — M129 Arthropathy, unspecified: Secondary | ICD-10-CM | POA: Insufficient documentation

## 2013-09-05 DIAGNOSIS — Y9389 Activity, other specified: Secondary | ICD-10-CM | POA: Insufficient documentation

## 2013-09-05 DIAGNOSIS — Y92410 Unspecified street and highway as the place of occurrence of the external cause: Secondary | ICD-10-CM | POA: Insufficient documentation

## 2013-09-05 DIAGNOSIS — F411 Generalized anxiety disorder: Secondary | ICD-10-CM | POA: Insufficient documentation

## 2013-09-05 DIAGNOSIS — E039 Hypothyroidism, unspecified: Secondary | ICD-10-CM | POA: Insufficient documentation

## 2013-09-05 DIAGNOSIS — IMO0002 Reserved for concepts with insufficient information to code with codable children: Secondary | ICD-10-CM | POA: Insufficient documentation

## 2013-09-05 DIAGNOSIS — Z79899 Other long term (current) drug therapy: Secondary | ICD-10-CM | POA: Insufficient documentation

## 2013-09-05 DIAGNOSIS — E785 Hyperlipidemia, unspecified: Secondary | ICD-10-CM | POA: Insufficient documentation

## 2013-09-05 DIAGNOSIS — S0993XA Unspecified injury of face, initial encounter: Secondary | ICD-10-CM | POA: Insufficient documentation

## 2013-09-05 DIAGNOSIS — K219 Gastro-esophageal reflux disease without esophagitis: Secondary | ICD-10-CM | POA: Insufficient documentation

## 2013-09-05 DIAGNOSIS — F3289 Other specified depressive episodes: Secondary | ICD-10-CM | POA: Insufficient documentation

## 2013-09-05 MED ORDER — TRAMADOL HCL 50 MG PO TABS: 50.0000 mg | ORAL_TABLET | Freq: Four times a day (QID) | ORAL | Status: DC | PRN

## 2013-09-05 MED ORDER — METHOCARBAMOL 500 MG PO TABS: 500.0000 mg | ORAL_TABLET | Freq: Two times a day (BID) | ORAL | Status: DC

## 2013-09-05 NOTE — ED Notes (Signed)
Patient has called out  X 3 since he's been in fast track.  Patient very anxious and wants PA to come see him now.  Patient has been advised that she will be in with him as soon as she can, but that she has a few in front of him.
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
## 2013-09-09 ENCOUNTER — Ambulatory Visit: Payer: Medicare Other | Admitting: Cardiovascular Disease
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
## 2013-09-12 NOTE — ED Provider Notes (Signed)
Medical screening examination/treatment/procedure(s) were performed by non-physician practitioner and as supervising physician I was immediately available for consultation/collaboration.   Quavis Klutz, MD 09/12/13 0154 

## 2013-09-26 ENCOUNTER — Ambulatory Visit (INDEPENDENT_AMBULATORY_CARE_PROVIDER_SITE_OTHER): Payer: Medicare Other

## 2013-09-26 DIAGNOSIS — Z23 Encounter for immunization: Secondary | ICD-10-CM

## 2013-10-01 ENCOUNTER — Ambulatory Visit: Payer: Medicare Other | Admitting: Cardiovascular Disease

## 2014-01-01 DIAGNOSIS — F419 Anxiety disorder, unspecified: Secondary | ICD-10-CM | POA: Diagnosis present

## 2014-04-08 ENCOUNTER — Telehealth: Payer: Self-pay | Admitting: Internal Medicine

## 2014-04-08 NOTE — Telephone Encounter (Signed)
Patient reports dysphagia and abdominal discomfort.  "I don't notice it if I am up moving around or doing something, but if Im just sitting down I do".  He has been taking Goody powders.  He is advised that he should avoid this and use Tylenol for pain.  He is asked to increase his nexium to BID until his office visit.  Patient instructed to maintain an anti-reflux diet. Advised to avoid caffeine, mint, citrus foods/juices, tomatoes,  chocolate, NSAIDS/ASA products.  Instructed not to eat within 2 hours of exercise or bed, multiple small meals are better than 3 large meals.  Need to take PPI 30 minutes prior to 1st meal of the day. He will call back in a few weeks if no improvement in dietary changes or BID PPI for an appt with APP.  I have placed him on the cancellation list.

## 2014-04-16 ENCOUNTER — Telehealth: Payer: Self-pay | Admitting: Internal Medicine

## 2014-04-16 DIAGNOSIS — R1310 Dysphagia, unspecified: Secondary | ICD-10-CM

## 2014-04-16 NOTE — Telephone Encounter (Signed)
Spoke to patient in office - wife's visit Having intermittent solid dysphagia and requesting EGD dilation which has helped in past  Will set up direct EGD/dialtion

## 2014-04-16 NOTE — Telephone Encounter (Signed)
EGD/dil set up and patient instructed.

## 2014-04-17 ENCOUNTER — Ambulatory Visit (AMBULATORY_SURGERY_CENTER): Payer: Medicare Other | Admitting: Internal Medicine

## 2014-04-17 ENCOUNTER — Encounter: Payer: Self-pay | Admitting: Internal Medicine

## 2014-04-17 VITALS — BP 142/93 | HR 65 | Temp 97.9°F | Resp 17 | Ht 70.0 in | Wt 185.0 lb

## 2014-04-17 DIAGNOSIS — R1310 Dysphagia, unspecified: Secondary | ICD-10-CM

## 2014-04-17 DIAGNOSIS — K200 Eosinophilic esophagitis: Secondary | ICD-10-CM

## 2014-04-17 MED ORDER — SODIUM CHLORIDE 0.9 % IV SOLN: 500.0000 mL | INTRAVENOUS | Status: DC

## 2014-04-17 NOTE — Patient Instructions (Addendum)
I stretched the esophagus again. I also took biopsies to see if there is an underlying problem (allergy, inflammation). No signs of cancer or ulcers. I will let you know.  I appreciate the opportunity to care for you. Gatha Mayer, MD, [facility]   Discharge instructions given with verbal understanding. Handout on a dilatation diet. Resume previous medications. YOU HAD AN ENDOSCOPIC PROCEDURE TODAY AT [facility]: Refer to the procedure report that was given to you for any specific questions about what was found during the examination.  If the procedure report does not answer your questions, please call your gastroenterologist to clarify.  If you requested that your care partner not be given the details of your procedure findings, then the procedure report has been included in a sealed envelope for you to review at your convenience later.  YOU SHOULD EXPECT: Some feelings of bloating in the abdomen. Passage of more gas than usual.  Walking can help get rid of the air that was put into your GI tract during the procedure and reduce the bloating. If you had a lower endoscopy (such as a colonoscopy or flexible sigmoidoscopy) you may notice spotting of blood in your stool or on the toilet paper. If you underwent a bowel prep for your procedure, then you may not have a normal bowel movement for a few days.  DIET: Your first meal following the procedure should be a light meal and then it is ok to progress to your normal diet.  A half-sandwich or bowl of soup is an example of a good first meal.  Heavy or fried foods are harder to digest and may make you feel nauseous or bloated.  Likewise meals heavy in dairy and vegetables can cause extra gas to form and this can also increase the bloating.  Drink plenty of fluids but you should avoid alcoholic beverages for 24 hours.  ACTIVITY: Your care partner should take you home directly after the procedure.  You should plan to take it easy, moving  slowly for the rest of the day.  You can resume normal activity the day after the procedure however you should NOT DRIVE or use heavy machinery for 24 hours (because of the sedation medicines used during the test).    SYMPTOMS TO REPORT IMMEDIATELY: A gastroenterologist can be reached at any hour.  During normal business hours, 8:30 AM to 5:00 PM Monday through Friday, call [phone].  After hours and on weekends, please call the GI answering service at [phone] who will take a message and have the physician on call contact you.   Following upper endoscopy (EGD)  Vomiting of blood or coffee ground material  New chest pain or pain under the shoulder blades  Painful or persistently difficult swallowing  New shortness of breath  Fever of 100F or higher  Black, tarry-looking stools  FOLLOW UP: If any biopsies were taken you will be contacted by phone or by letter within the next 1-3 weeks.  Call your gastroenterologist if you have not heard about the biopsies in 3 weeks.  Our staff will call the home number listed on your records the next business day following your procedure to check on you and address any questions or concerns that you may have at that time regarding the information given to you following your procedure. This is a courtesy call and so if there is no answer at the home number and we have not heard from you through the emergency physician on  call, we will assume that you have returned to your regular daily activities without incident.  SIGNATURES/CONFIDENTIALITY: You and/or your care partner have signed paperwork which will be entered into your electronic medical record.  These signatures attest to the fact that that the information above on your After Visit Summary has been reviewed and is understood.  Full responsibility of the confidentiality of this discharge information lies with you and/or your care-partner.

## 2014-04-17 NOTE — Op Note (Signed)
[place]  [address] [place], [id number]   ENDOSCOPY PROCEDURE REPORT  PATIENT: John Mack, John Mack  MR#: [id number] BIRTHDATE: July 11, 1941 , 55  yrs. old GENDER: Male ENDOSCOPIST: Gatha Mayer, MD, [facility] PROCEDURE DATE:  04/17/2014 PROCEDURE:  EGD w/ biopsy and Maloney dilation of esophagus ASA CLASS:     Class III INDICATIONS:  Dysphagia. MEDICATIONS: propofol (Diprivan) 150mg  IV, These medications were titrated to patient response per physician's verbal order, and MAC sedation, administered by CRNA TOPICAL ANESTHETIC: Cetacaine Spray  DESCRIPTION OF PROCEDURE: After the risks benefits and alternatives of the procedure were thoroughly explained, informed consent was obtained.  The LB [id number] endoscope was introduced through the mouth and advanced to the second portion of the duodenum. Without limitations.  The instrument was slowly withdrawn as the mucosa was fully examined.      ESOPHAGUS: Possible signs of eosinophilic esophagitis with mucosal changes that included longitudinal furrows, white spots and felinization of the esophagus were found in the entire esophagus. Biopsies were taken throughout.  The remainder of the upper endoscopy exam was otherwise normal. Retroflexed views revealed no abnormalities.     The scope was then withdrawn from the patient, a 7 Fr Maloney dilator was passed without difficulty and only trace heme,  and the procedure completed.  COMPLICATIONS: There were no complications. ENDOSCOPIC IMPRESSION: 1.   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 2.   The remainder of the upper endoscopy exam was otherwise normal  RECOMMENDATIONS: 1.  Clear liquids until 3 PM , then soft foods rest of day.  Resume prior diet tomorrow. 2.  Await pathology results   eSigned:  Gatha Mayer, MD, [facility] 04/17/2014 2:13 PM  CC:The Patient

## 2014-04-20 ENCOUNTER — Telehealth: Payer: Self-pay | Admitting: *Deleted

## 2014-04-20 NOTE — Telephone Encounter (Signed)
No answer. Number identifier. Message left to call if questions or concerns. 

## 2014-04-21 ENCOUNTER — Encounter: Payer: Self-pay | Admitting: Internal Medicine

## 2014-04-21 DIAGNOSIS — K200 Eosinophilic esophagitis: Secondary | ICD-10-CM

## 2014-04-21 HISTORY — DX: Eosinophilic esophagitis: K20.0

## 2014-04-24 ENCOUNTER — Telehealth: Payer: Self-pay | Admitting: Internal Medicine

## 2014-04-24 NOTE — Telephone Encounter (Signed)
Patient was calling for the results of the biopsy.  I reviewed the results with him and set up follow up for 06/30/14 11:30

## 2014-04-27 ENCOUNTER — Ambulatory Visit (INDEPENDENT_AMBULATORY_CARE_PROVIDER_SITE_OTHER): Payer: Self-pay | Admitting: *Deleted

## 2014-04-27 DIAGNOSIS — I639 Cerebral infarction, unspecified: Secondary | ICD-10-CM

## 2014-04-27 DIAGNOSIS — I6350 Cerebral infarction due to unspecified occlusion or stenosis of unspecified cerebral artery: Secondary | ICD-10-CM

## 2014-04-27 NOTE — Progress Notes (Signed)
Participant in the office today for his IRIS 5th. Annual and EXIT visit. MMSE was completed successful without any problems.  Blood was drawn and shipped. Last dose of study drug taken on 04/26/2014. Last bottle of study drug returned to the [facility]. Participant to continue with follow-up visits with his PCP. Participant will be notified of study trial results in late 2015.

## 2014-06-01 ENCOUNTER — Ambulatory Visit: Payer: Medicare Other | Admitting: Internal Medicine

## 2014-06-30 ENCOUNTER — Encounter: Payer: Self-pay | Admitting: Internal Medicine

## 2014-06-30 ENCOUNTER — Ambulatory Visit (INDEPENDENT_AMBULATORY_CARE_PROVIDER_SITE_OTHER): Payer: Medicare Other | Admitting: Internal Medicine

## 2014-06-30 VITALS — BP 152/90 | HR 60 | Ht 69.0 in | Wt 200.0 lb

## 2014-06-30 DIAGNOSIS — K219 Gastro-esophageal reflux disease without esophagitis: Secondary | ICD-10-CM

## 2014-06-30 DIAGNOSIS — I6350 Cerebral infarction due to unspecified occlusion or stenosis of unspecified cerebral artery: Secondary | ICD-10-CM

## 2014-06-30 DIAGNOSIS — K200 Eosinophilic esophagitis: Secondary | ICD-10-CM

## 2014-06-30 NOTE — Assessment & Plan Note (Signed)
Doing well on PPI and s/p dilation - continue Education pamphlet provided If recurrentdysphagia consider topical Tx vs dilation

## 2014-06-30 NOTE — Patient Instructions (Signed)
Glad to hear your doing well.    Today we are giving you reading information on Eosinophilic Esophagitis.  Follow up with [place] in 2 years or sooner if needed.   I appreciate the opportunity to care for you.

## 2014-06-30 NOTE — Assessment & Plan Note (Signed)
Controled on PPI - Nexium RTC 1-2 years

## 2014-06-30 NOTE — Progress Notes (Signed)
   Subjective:    Patient ID: John Mack, male    DOB: June 09, 1941, 73 y.o.   MRN: [id number]  HPI The patient is doing well without dysphagia after EGD and dilation in [place]. 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 Rare heartburn on Nexium, prn H2B relieves 1-2 x/ week max.  No Known Allergies Outpatient Prescriptions Prior to Visit  Medication Sig Dispense Refill  . ALPRAZolam (XANAX) 0.5 MG tablet Take 1 mg by mouth at bedtime as needed for sleep.       Marland Kitchen esomeprazole (NEXIUM) 40 MG capsule Take 40 mg by mouth daily before breakfast.      . levothyroxine (SYNTHROID, LEVOTHROID) 100 MCG tablet Take 100 mcg by mouth daily before breakfast.      . nebivolol (BYSTOLIC) 2.5 MG tablet Take 2.5 mg by mouth daily.      Vladimir Faster Glycol-Propyl Glycol (SYSTANE OP) Place 1 drop into both eyes daily.      Marland Kitchen testosterone cypionate (DEPOTESTOTERONE CYPIONATE) 100 MG/ML injection Inject 200 mg into the muscle every 14 (fourteen) days. For IM use only      . VITAMIN B1-B12 IJ Inject 1,000 mg as directed every 30 (thirty) days.      Marland Kitchen EPIPEN 2-PAK 0.3 MG/0.3ML DEVI Inject 0.3 mg into the muscle as needed.        No facility-administered medications prior to visit.   Past Medical History  Diagnosis Date  . Allergy   . Anxiety   . Arthritis   . Asthma   . Eye abnormalities     calcium deposits behind eyes  . Depression   . GERD (gastroesophageal reflux disease)   . Hyperlipidemia   . Hypertension   . Stroke     2 TIAs  . Thyroid disease     hypothyroid  . Eosinophilic esophagitis 7/82/9562    EGD 04/2014 17 Eos/hpf esophageal bxs   Past Surgical History  Procedure Laterality Date  . Appendectomy    . Tonsillectomy    . Shoulder arthroscopy      left shoulder, both knees  . Shoulder surgery      right surgery  . Knee arthroscopy      both knees  . Colonoscopy  10/02/2006, 12/18/2011  . Upper gastrointestinal endoscopy  02/04/2009    with [facility]  dilation  . Esophagoscopy  12/18/2011    with Venia Minks dilation   Review of Systems As above    Objective:   Physical Exam WDWN NAD    Assessment & Plan:  Eosinophilic esophagitis -  Doing well on PPI and s/p dilation - continue Education pamphlet provided If recurrentdysphagia consider topical Tx vs dilation  GERD Controled on PPI - Nexium RTC 1-2 years   I appreciate the opportunity to care for him. ZH:YQMV,[id number] R, MD

## 2014-07-16 ENCOUNTER — Other Ambulatory Visit: Payer: Self-pay | Admitting: Dermatology

## 2015-02-10 ENCOUNTER — Other Ambulatory Visit: Payer: Self-pay | Admitting: Dermatology

## 2015-05-06 ENCOUNTER — Encounter: Payer: Self-pay | Admitting: Internal Medicine

## 2015-07-05 DIAGNOSIS — R053 Chronic cough: Secondary | ICD-10-CM | POA: Insufficient documentation

## 2015-12-27 ENCOUNTER — Telehealth: Payer: Self-pay | Admitting: Internal Medicine

## 2015-12-27 NOTE — Telephone Encounter (Signed)
Direct EGD/dilation for dysphagia and EoE ok Anticipate [facility]

## 2015-12-27 NOTE — Telephone Encounter (Signed)
Patient reports that he is having dysphagia.  He is having difficulty with rice, bread, and some meat.  He states that "I need my esophagus stretched".  He is havign epigastric pain.  He has a history of of EE and esophageal dilation.  He is taking Nexium OTC 2 q am and ranitidine 150 mg BID.  Ok to set up EGD dil or does he need OV?

## 2015-12-27 NOTE — Telephone Encounter (Signed)
Patient notified He is scheduled for pre-visit 12/31/15 at 1:00 and EGD 01/10/16 11:00

## 2015-12-31 ENCOUNTER — Ambulatory Visit (AMBULATORY_SURGERY_CENTER): Payer: Self-pay | Admitting: *Deleted

## 2015-12-31 VITALS — Ht 70.0 in | Wt 202.0 lb

## 2015-12-31 DIAGNOSIS — R1310 Dysphagia, unspecified: Secondary | ICD-10-CM

## 2015-12-31 NOTE — Progress Notes (Signed)
No allergies to eggs or soy. No problems with anesthesia.  Pt given Emmi instructions for EGD  No oxygen use  No diet drug use  

## 2016-01-10 ENCOUNTER — Ambulatory Visit (AMBULATORY_SURGERY_CENTER): Payer: Medicare Other | Admitting: Internal Medicine

## 2016-01-10 ENCOUNTER — Encounter: Payer: Self-pay | Admitting: Internal Medicine

## 2016-01-10 VITALS — BP 128/72 | HR 67 | Temp 98.8°F | Resp 18 | Ht 70.0 in | Wt 202.0 lb

## 2016-01-10 DIAGNOSIS — R1310 Dysphagia, unspecified: Secondary | ICD-10-CM

## 2016-01-10 DIAGNOSIS — K2950 Unspecified chronic gastritis without bleeding: Secondary | ICD-10-CM | POA: Diagnosis not present

## 2016-01-10 DIAGNOSIS — K200 Eosinophilic esophagitis: Secondary | ICD-10-CM | POA: Diagnosis not present

## 2016-01-10 MED ORDER — SODIUM CHLORIDE 0.9 % IV SOLN: 500.0000 mL | INTRAVENOUS | Status: DC

## 2016-01-10 NOTE — Op Note (Signed)
[place]  [address] [place], [id number]   ENDOSCOPY PROCEDURE REPORT  PATIENT: John Mack, John Mack  MR#: [id number] BIRTHDATE: Jul 10, 1941 , 19  yrs. old GENDER: male ENDOSCOPIST: Gatha Mayer, MD, [facility] PROCEDURE DATE:  01/10/2016 PROCEDURE:  EGD w/ biopsy and Maloney dilation of esophagus ASA CLASS:     Class III INDICATIONS:  therapeutic procedure and dysphagia. MEDICATIONS: Propofol 200 mg IV and Monitored anesthesia care TOPICAL ANESTHETIC: none  DESCRIPTION OF PROCEDURE: After the risks benefits and alternatives of the procedure were thoroughly explained, informed consent was obtained.  The LB [id number] endoscope was introduced through the mouth and advanced to the second portion of the duodenum , Without limitations.  The instrument was slowly withdrawn as the mucosa was fully examined.    1) Mild fissuring in the esopgaus with small white spots - biopsies taken to assess eosinophilic esophagitis (known dx). 2) Patchy erythema and mottled mucosa distal stomach - biopsies taken 3) Otherwise normal EGD 4) 54 Fr Maloney dilator easily passed w/o difficulty - no heme.  Retroflexed views revealed no abnormalities. The scope was then withdrawn from the patient and the procedure completed.  COMPLICATIONS: There were no immediate complications.  ENDOSCOPIC IMPRESSION: 1) Mild fissuring in the esopgaus with small white spots - biopsies taken to assess eosinophilic esophagitis (known dx). 2) Patchy erythema and mottled mucosa distal stomach - biopsies taken 3) Otherwise normal EGD 4) 54 Fr Maloney dilator easily passed w/o difficulty - no heme  RECOMMENDATIONS: 1.  Clear liquids until noon, then soft foods rest of day.  Resume prior diet tomorrow. 2.  Continue current medications 3.  Office will call with results     - could need steroid Tx   eSigned:  Gatha Mayer, MD, [facility] 01/10/2016 10:48 AM    CC:The Patient and Dr. Berneta Sages

## 2016-01-10 NOTE — Progress Notes (Signed)
No problems noted in the recovery room. maw 

## 2016-01-10 NOTE — Progress Notes (Signed)
Called to room to assist during endoscopic procedure.  Patient ID and intended procedure confirmed with present staff. Received instructions for my participation in the procedure from the performing physician.  

## 2016-01-10 NOTE — Patient Instructions (Addendum)
I took biopsies to assess the status of the eosinophilic esophagitis and dilated the esophagus. Also took stomach biopises. Will let you know results and any changes.  I appreciate the opportunity to care for you. Gatha Mayer, MD, FACG    YOU HAD AN ENDOSCOPIC PROCEDURE TODAY AT [facility]:   Refer to the procedure report that was given to you for any specific questions about what was found during the examination.  If the procedure report does not answer your questions, please call your gastroenterologist to clarify.  If you requested that your care partner not be given the details of your procedure findings, then the procedure report has been included in a sealed envelope for you to review at your convenience later.  YOU SHOULD EXPECT: Some feelings of bloating in the abdomen. Passage of more gas than usual.  Walking can help get rid of the air that was put into your GI tract during the procedure and reduce the bloating. If you had a lower endoscopy (such as a colonoscopy or flexible sigmoidoscopy) you may notice spotting of blood in your stool or on the toilet paper. If you underwent a bowel prep for your procedure, you may not have a normal bowel movement for a few days.  Please Note:  You might notice some irritation and congestion in your nose or some drainage.  This is from the oxygen used during your procedure.  There is no need for concern and it should clear up in a day or so.  SYMPTOMS TO REPORT IMMEDIATELY:    Following upper endoscopy (EGD)  Vomiting of blood or coffee ground material  New chest pain or pain under the shoulder blades  Painful or persistently difficult swallowing  New shortness of breath  Fever of 100F or higher  Black, tarry-looking stools  For urgent or emergent issues, a gastroenterologist can be reached at any hour by calling [phone].   DIET: YoDrink plenty of fluids but you should avoid alcoholic beverages for 24  hours.  Please follow the dilatation diet the rest of the day.  Handout given to your care partner today.  ACTIVITY:  You should plan to take it easy for the rest of today and you should NOT DRIVE or use heavy machinery until tomorrow (because of the sedation medicines used during the test).    FOLLOW UP: Our staff will call the number listed on your records the next business day following your procedure to check on you and address any questions or concerns that you may have regarding the information given to you following your procedure. If we do not reach you, we will leave a message.  However, if you are feeling well and you are not experiencing any problems, there is no need to return our call.  We will assume that you have returned to your regular daily activities without incident.  If any biopsies were taken you will be contacted by phone or by letter within the next 1-3 weeks.  Please call us at [phone] if you have not heard about the biopsies in 3 weeks.    SIGNATURES/CONFIDENTIALITY: You and/or your care partner have signed paperwork which will be entered into your electronic medical record.  These signatures attest to the fact that that the information above on your After Visit Summary has been reviewed and is understood.  Full responsibility of the confidentiality of this discharge information lies with you and/or your care-partner.    Handout was given  to your care partner on esophgaeal stricture. You may resume your current medications today. Await biopsy results. Please call if any questions or concerns.

## 2016-01-10 NOTE — Progress Notes (Signed)
To recovery, report to Willis, RN, VSS 

## 2016-01-11 ENCOUNTER — Telehealth: Payer: Self-pay | Admitting: *Deleted

## 2016-01-11 NOTE — Telephone Encounter (Signed)
  Follow up Call-  Call back number 01/10/2016 04/17/2014  Post procedure Call Back phone  # [phone]  Permission to leave phone message Yes Yes     Patient questions:  Do you have a fever, pain , or abdominal swelling? No. Pain Score  0 *  Have you tolerated food without any problems? Yes.    Have you been able to return to your normal activities? Yes.    Do you have any questions about your discharge instructions: Diet   No. Medications  No. Follow up visit  No.  Do you have questions or concerns about your Care? No.  Actions: * If pain score is 4 or above: No action needed, pain <4.

## 2016-01-14 ENCOUNTER — Telehealth: Payer: Self-pay | Admitting: Internal Medicine

## 2016-01-14 NOTE — Progress Notes (Signed)
Quick Note:  No inflammation seen Hope dilation helped F/u prn  [facility] - no letter/recall ______

## 2016-01-14 NOTE — Telephone Encounter (Signed)
Updated patients med list  

## 2016-01-17 ENCOUNTER — Telehealth: Payer: Self-pay | Admitting: Internal Medicine

## 2016-01-17 NOTE — Telephone Encounter (Signed)
Path results reviewed again with the patient.  He will call back for any additional questions or concerns

## 2016-12-25 ENCOUNTER — Encounter: Payer: Self-pay | Admitting: Internal Medicine

## 2017-01-01 ENCOUNTER — Encounter: Payer: Self-pay | Admitting: Internal Medicine

## 2017-01-01 ENCOUNTER — Telehealth: Payer: Self-pay | Admitting: Internal Medicine

## 2017-01-01 NOTE — Telephone Encounter (Signed)
Patient has been scheduled for recall colonoscopy 02/15/2017.

## 2017-01-01 NOTE — Telephone Encounter (Signed)
Yes, please help him schedule a colonoscopy

## 2017-01-25 ENCOUNTER — Telehealth: Payer: Self-pay

## 2017-01-25 NOTE — Telephone Encounter (Signed)
Dr Carlean Purl,      Last colon prep was only adequate with MOVIprep.  Would you like a standard 2 day prep with Suprep and Miralax?                                                                                                                                           Thank you,                                                                                                                                              Deshaun Weisinger/PV

## 2017-01-25 NOTE — Telephone Encounter (Signed)
Let's do this  Take a daily dose of MiraLax starting 1 week before On day before prep day take 4 dulcolax in afternoon and 2 doses Miralax  Then Miralax prep on prep day

## 2017-02-05 ENCOUNTER — Ambulatory Visit (AMBULATORY_SURGERY_CENTER): Payer: Self-pay

## 2017-02-05 VITALS — Ht 70.0 in | Wt 200.0 lb

## 2017-02-05 DIAGNOSIS — Z860100 Personal history of colon polyps, unspecified: Secondary | ICD-10-CM

## 2017-02-05 NOTE — Progress Notes (Signed)
No allergies to eggs or soy No past problems with anesthesia No diet meds No home oxygen  Declined emmi 

## 2017-02-15 ENCOUNTER — Encounter: Payer: Self-pay | Admitting: Internal Medicine

## 2017-02-15 ENCOUNTER — Ambulatory Visit (AMBULATORY_SURGERY_CENTER): Payer: Medicare Other | Admitting: Internal Medicine

## 2017-02-15 VITALS — BP 144/69 | HR 65 | Temp 96.6°F | Resp 17 | Ht 70.0 in | Wt 200.0 lb

## 2017-02-15 DIAGNOSIS — Z86010 Personal history of colonic polyps: Secondary | ICD-10-CM | POA: Diagnosis not present

## 2017-02-15 DIAGNOSIS — D124 Benign neoplasm of descending colon: Secondary | ICD-10-CM

## 2017-02-15 MED ORDER — SODIUM CHLORIDE 0.9 % IV SOLN: 500.0000 mL | INTRAVENOUS | Status: DC

## 2017-02-15 NOTE — Progress Notes (Signed)
Called to room to assist during endoscopic procedure.  Patient ID and intended procedure confirmed with present staff. Received instructions for my participation in the procedure from the performing physician.  

## 2017-02-15 NOTE — Patient Instructions (Addendum)
   One tiny polyp removed. I will let you know pathology results and when/if to have another routine colonoscopy by mail.   You also have a condition called diverticulosis - common and not usually a problem. Please read the handout provided.  You do not need to check stool for blood if offered.  I appreciate the opportunity to care for you. Gatha Mayer, MD, [facility]  Handouts given for Diververticulosis and polyps  YOU HAD AN ENDOSCOPIC PROCEDURE TODAY: Refer to the procedure report and other information in the discharge instructions given to you for any specific questions about what was found during the examination. If this information does not answer your questions, please call [place] office at [phone] to clarify.   YOU SHOULD EXPECT: Some feelings of bloating in the abdomen. Passage of more gas than usual. Walking can help get rid of the air that was put into your GI tract during the procedure and reduce the bloating. If you had a lower endoscopy (such as a colonoscopy or flexible sigmoidoscopy) you may notice spotting of blood in your stool or on the toilet paper. Some abdominal soreness may be present for a day or two, also.  DIET: Your first meal following the procedure should be a light meal and then it is ok to progress to your normal diet. A half-sandwich or bowl of soup is an example of a good first meal. Heavy or fried foods are harder to digest and may make you feel nauseous or bloated. Drink plenty of fluids but you should avoid alcoholic beverages for 24 hours. If you had a esophageal dilation, please see attached instructions for diet.    ACTIVITY: Your care partner should take you home directly after the procedure. You should plan to take it easy, moving slowly for the rest of the day. You can resume normal activity the day after the procedure however YOU SHOULD NOT DRIVE, use power tools, machinery or perform tasks that involve climbing or major physical exertion for 24  hours (because of the sedation medicines used during the test).   SYMPTOMS TO REPORT IMMEDIATELY: A gastroenterologist can be reached at any hour. Please call [phone]  for any of the following symptoms:  Following lower endoscopy (colonoscopy, flexible sigmoidoscopy) Excessive amounts of blood in the stool  Significant tenderness, worsening of abdominal pains  Swelling of the abdomen that is new, acute  Fever of 100 or higher   FOLLOW UP:  If any biopsies were taken you will be contacted by phone or by letter within the next 1-3 weeks. Call [phone]  if you have not heard about the biopsies in 3 weeks.  Please also call with any specific questions about appointments or follow up tests.

## 2017-02-15 NOTE — Progress Notes (Signed)
Report given to PACU, vss 

## 2017-02-15 NOTE — Op Note (Signed)
[place] Patient Name: John Mack Procedure Date: 02/15/2017 9:50 AM MRN: [id number] Endoscopist: Gatha Mayer , MD Age: 76 Referring MD:  Date of Birth: 03/19/1941 Gender: Male Account #: [id number] Procedure:                Colonoscopy Indications:              Surveillance: Personal history of adenomatous                            polyps on last colonoscopy 5 years ago Medicines:                Propofol per Anesthesia, Monitored Anesthesia Care Procedure:                Pre-Anesthesia Assessment:                           - Prior to the procedure, a History and Physical                            was performed, and patient medications and                            allergies were reviewed. The patient's tolerance of                            previous anesthesia was also reviewed. The risks                            and benefits of the procedure and the sedation                            options and risks were discussed with the patient.                            All questions were answered, and informed consent                            was obtained. Prior Anticoagulants: The patient has                            taken no previous anticoagulant or antiplatelet                            agents. ASA Grade Assessment: II - A patient with                            mild systemic disease. After reviewing the risks                            and benefits, the patient was deemed in                            satisfactory condition to undergo the procedure.  After obtaining informed consent, the colonoscope                            was passed under direct vision. Throughout the                            procedure, the patient's blood pressure, pulse, and                            oxygen saturations were monitored continuously. The                            Model CF-HQ190L [id number]) scope was introduced   through the anus and advanced to the the cecum,                            identified by appendiceal orifice and ileocecal                            valve. The colonoscopy was performed without                            difficulty. The patient tolerated the procedure                            well. The quality of the bowel preparation was                            adequate. The bowel preparation used was Miralax.                            The ileocecal valve, appendiceal orifice, and                            rectum were photographed. Scope In: 9:58:06 AM Scope Out: 10:18:51 AM Scope Withdrawal Time: 0 hours 17 minutes 28 seconds  Total Procedure Duration: 0 hours 20 minutes 45 seconds  Findings:                 The perianal and digital rectal examinations were                            normal. Pertinent negatives include normal prostate                            (size, shape, and consistency).                           A 5 mm polyp was found in the descending colon. The                            polyp was sessile. The polyp was removed with a                            cold snare.  Resection and retrieval were complete.                            Verification of patient identification for the                            specimen was done. Estimated blood loss was minimal.                           Multiple small and large-mouthed diverticula were                            found in the sigmoid colon.                           The exam was otherwise without abnormality on                            direct and retroflexion views. Complications:            No immediate complications. Estimated Blood Loss:     Estimated blood loss was minimal. Impression:               - One 5 mm polyp in the descending colon, removed                            with a cold snare. Resected and retrieved.                           - Diverticulosis in the sigmoid colon.                           - The  examination was otherwise normal on direct                            and retroflexion views. Recommendation:           - Patient has a contact number available for                            emergencies. The signs and symptoms of potential                            delayed complications were discussed with the                            patient. Return to normal activities tomorrow.                            Written discharge instructions were provided to the                            patient.                           - Resume previous diet.                           -  Continue present medications.                           - Repeat colonoscopy will be considered for                            surveillance. The colonoscopy date will be                            determined after pathology results from today's                            exam become available for review. At his age and                            with low risk lesions may not need another routine                            exam. Gatha Mayer, MD 02/15/2017 10:26:12 AM This report has been signed electronically.

## 2017-02-16 ENCOUNTER — Telehealth: Payer: Self-pay

## 2017-02-16 NOTE — Telephone Encounter (Signed)
  Follow up Call-  Call back number 02/15/2017 01/10/2016  Post procedure Call Back phone  # [phone]  Permission to leave phone message Yes Yes  Some recent data might be hidden     Patient questions:  Do you have a fever, pain , or abdominal swelling? No. Pain Score  0 *  Have you tolerated food without any problems? Yes.    Have you been able to return to your normal activities? Yes.    Do you have any questions about your discharge instructions: Diet   No. Medications  No. Follow up visit  No.  Do you have questions or concerns about your Care? No.  Actions: * If pain score is 4 or above: No action needed, pain <4.

## 2017-02-21 ENCOUNTER — Encounter: Payer: Self-pay | Admitting: Internal Medicine

## 2017-02-21 DIAGNOSIS — Z86010 Personal history of colonic polyps: Secondary | ICD-10-CM

## 2017-02-21 NOTE — Progress Notes (Signed)
Diminutive adenoma No recall - age

## 2017-04-17 ENCOUNTER — Encounter: Payer: Self-pay | Admitting: Cardiology

## 2017-04-18 ENCOUNTER — Encounter (INDEPENDENT_AMBULATORY_CARE_PROVIDER_SITE_OTHER): Payer: Self-pay

## 2017-04-18 ENCOUNTER — Encounter: Payer: Self-pay | Admitting: Cardiology

## 2017-04-18 ENCOUNTER — Ambulatory Visit (INDEPENDENT_AMBULATORY_CARE_PROVIDER_SITE_OTHER): Payer: Medicare Other | Admitting: Cardiology

## 2017-04-18 VITALS — BP 130/82 | HR 61 | Ht 70.0 in | Wt 203.4 lb

## 2017-04-18 DIAGNOSIS — I10 Essential (primary) hypertension: Secondary | ICD-10-CM | POA: Insufficient documentation

## 2017-04-18 DIAGNOSIS — R002 Palpitations: Secondary | ICD-10-CM

## 2017-04-18 DIAGNOSIS — I420 Dilated cardiomyopathy: Secondary | ICD-10-CM | POA: Insufficient documentation

## 2017-04-18 HISTORY — DX: Dilated cardiomyopathy: I42.0

## 2017-04-18 NOTE — Patient Instructions (Signed)
Medication Instructions:  Your physician recommends that you continue on your current medications as directed. Please refer to the Current Medication list given to you today.   Labwork: None  Testing/Procedures: Your physician has requested that you have an echocardiogram. Echocardiography is a painless test that uses sound waves to create images of your heart. It provides your doctor with information about the size and shape of your heart and how well your heart's chambers and valves are working. This procedure takes approximately one hour. There are no restrictions for this procedure.   Your physician has requested that you have an exercise tolerance test. For further information please visit [email or web address]. Please also follow instruction sheet, as given.  Your physician has recommended that you wear an event monitor. Event monitors are medical devices that record the heart's electrical activity. Doctors most often [place] these monitors to diagnose arrhythmias. Arrhythmias are problems with the speed or rhythm of the heartbeat. The monitor is a small, portable device. You can wear one while you do your normal daily activities. This is usually used to diagnose what is causing palpitations/syncope (passing out).  Follow-Up: Your physician wants you to follow-up in: 1 year with Dr. Radford Pax. You will receive a reminder letter in the mail two months in advance. If you don't receive a letter, please call our office to schedule the follow-up appointment.   Any Other Special Instructions Will Be Listed Below (If Applicable).     If you need a refill on your cardiac medications before your next appointment, please call your pharmacy.

## 2017-04-18 NOTE — Progress Notes (Signed)
Cardiology Office Note    Date:  04/18/2017   ID:  KAL CHAIT, DOB February 02, 1941, MRN [id number]  PCP:  Prince Solian, MD  Cardiologist:  Fransico Him, MD   Chief Complaint  Patient presents with  . New Evaluation    Palpitations, DCM    History of Present Illness:  John Mack is a 76 y.o. male who is being seen today for the evaluation of palpitations at the request of Avva, Ravisankar, MD.  He has a history of anxiety, depression, GERD, HTN and hyperlipidemia and has also had 2 TIA's.  He also has a history of DCM with EF of 45% and was seen by a Cardiologist at [facility] a year ago.  He has a chronic cough and is being seen by pulmonary medicine.  He is an avid runner and runs about 23 miles once every other week and then 40 miles a week getting ready for a marathon.  He was felt to have runners heart given how much he runs.  He has been maintained on ARB and BB. He denies any chest pain or pressure or DOE at rest or with running.  He has a history of palpitations in the past but tolerates them well.  He has worn a heart monitor years ago which he says showed some skipped heart beats.  He denies any PND, orthopnea, LE edema, dizziness or syncope.  There is no family of history of SCD and no personal history of syncope.      Past Medical History:  Diagnosis Date  . Allergy   . Anxiety   . Arthritis   . Asthma   . DCM (dilated cardiomyopathy) ([facility]) 04/18/2017   ED 45% by echo at [facility]  . Depression   . Eosinophilic esophagitis 01/30/2541   EGD 04/2014 17 Eos/hpf esophageal bxs  . Eye abnormalities    calcium deposits behind eyes  . GERD (gastroesophageal reflux disease)   . Hx of colonic polyps 12/18/2011  . Hyperlipidemia   . Hypertension   . Stroke [facility])    2 TIAs  . Thyroid disease    hypothyroid    Past Surgical History:  Procedure Laterality Date  . APPENDECTOMY    . COLONOSCOPY  10/02/2006, 12/18/2011  . ESOPHAGOSCOPY  12/18/2011   with Maloney dilation  . KNEE  ARTHROSCOPY     both knees  . SHOULDER ARTHROSCOPY     left shoulder, both knees  . SHOULDER SURGERY     right surgery  . TONSILLECTOMY    . UPPER GASTROINTESTINAL ENDOSCOPY  02/04/2009   with Venia Minks dilation    Current Medications: Current Meds  Medication Sig  . ALPRAZolam (XANAX) 0.5 MG tablet Take 1 mg by mouth at bedtime as needed for sleep.   . Budesonide-Formoterol Fumarate (SYMBICORT IN) Inhale into the lungs 2 (two) times daily.  . hydrochlorothiazide (HYDRODIURIL) 25 MG tablet Take 25 mg by mouth daily.  . Levothyroxine Sodium 112 MCG CAPS Take 100 mcg by mouth daily before breakfast.  . losartan (COZAAR) 50 MG tablet Take 50 mg by mouth 2 (two) times daily.   . Multiple Vitamins-Minerals (MULTIVITAMIN PO) Take 1 tablet by mouth daily.   . nebivolol (BYSTOLIC) 2.5 MG tablet Take 2.5 mg by mouth daily.  Vladimir Faster Glycol-Propyl Glycol (SYSTANE OP) Place 1 drop into both eyes daily.  Marland Kitchen testosterone cypionate (DEPOTESTOTERONE CYPIONATE) 100 MG/ML injection Inject 200 mg into the muscle every 14 (fourteen) days. For IM use only  . VITAMIN  B1-B12 IJ Inject 1,000 mg as directed every 30 (thirty) days.  . vitamin E 1000 UNIT capsule Take 1,000 Units by mouth 3 (three) times daily.   Current Facility-Administered Medications for the 04/18/17 encounter (Office Visit) with Sueanne Margarita, MD  Medication  . 0.9 %  sodium chloride infusion    Allergies:   Patient has no known allergies.   Social History   Social History  . Marital status: Married    Spouse name: N/A  . Number of children: N/A  . Years of education: N/A   Social History Main Topics  . Smoking status: Never Smoker  . Smokeless tobacco: Never Used  . Alcohol use No  . Drug use: No  . Sexual activity: Not on file   Other Topics Concern  . Not on file   Social History Narrative  . No narrative on file     Family History:  The patient's family history includes Mesothelioma in his paternal aunt and  paternal uncle.   ROS:   Please see the history of present illness.    Review of Systems  HENT: Positive for hearing loss.   Respiratory: Positive for cough.   Psychiatric/Behavioral: The patient is nervous/anxious.    All other systems reviewed and are negative.  No flowsheet data found.     PHYSICAL EXAM:   VS:  BP 130/82 (BP Location: Left Arm, Patient Position: Sitting, Cuff Size: Normal)   Pulse 61   Ht 5\' 10"  (1.778 m)   Wt 203 lb 6.4 oz (92.3 kg)   BMI 29.18 kg/m    GEN: Well nourished, well developed, in no acute distress  HEENT: normal  Neck: no JVD, carotid bruits, or masses Cardiac: RRR; no murmurs, rubs, or gallops,no edema.  Intact distal pulses bilaterally.  Respiratory:  clear to auscultation bilaterally, normal work of breathing GI: soft, nontender, nondistended, + BS MS: no deformity or atrophy  Skin: warm and dry, no rash Neuro:  Alert and Oriented x 3, Strength and sensation are intact Psych: euthymic mood, full affect  Wt Readings from Last 3 Encounters:  04/18/17 203 lb 6.4 oz (92.3 kg)  02/15/17 200 lb (90.7 kg)  02/05/17 200 lb (90.7 kg)      Studies/Labs Reviewed:   EKG:  EKG is ordered today.  The ekg ordered today demonstrates NSR at 61bpm with LAFB and no ST changes  Recent Labs: No results found for requested labs within last 8760 hours.   Lipid Panel    Component Value Date/Time   CHOL  04/08/2009 0333    174        ATP III CLASSIFICATION:  <200     mg/dL   Desirable  200-239  mg/dL   Borderline High  >=240    mg/dL   High          TRIG 96 04/08/2009 0333   HDL 30 (L) 04/08/2009 0333   CHOLHDL 5.8 04/08/2009 0333   VLDL 19 04/08/2009 0333   LDLCALC (H) 04/08/2009 0333    125        Total Cholesterol/HDL:CHD Risk Coronary Heart Disease Risk Table                     Men   Women  1/2 Average Risk   3.4   3.3  Average Risk       5.0   4.4  2 X Average Risk   9.6   7.1  3 X Average Risk  23.4   11.0        Use the  calculated Patient Ratio above and the CHD Risk Table to determine the patient's CHD Risk.        ATP III CLASSIFICATION (LDL):  <100     mg/dL   Optimal  100-129  mg/dL   Near or Above                    Optimal  130-159  mg/dL   Borderline  160-189  mg/dL   High  >190     mg/dL   Very High    Additional studies/ records that were reviewed today include:  none    ASSESSMENT:    1. Heart palpitations   2. Benign essential HTN   3. DCM (dilated cardiomyopathy) ([facility])      PLAN:  In order of problems listed above:  1. Heart palpitations - I will get an event monitor to assess for arrhythmias.  2. HTN - His BP is adequately controlled on current meds. 3. DCM - EF 45% by echo at [facility].  I will repeat an echo to make sure LVF has now deteriorated.  He also is getting ready for a marathon and given his advanced age would recommend ETT to rule out ischemia.      Medication Adjustments/Labs and Tests Ordered: Current medicines are reviewed at length with the patient today.  Concerns regarding medicines are outlined above.  Medication changes, Labs and Tests ordered today are listed in the Patient Instructions below.  There are no Patient Instructions on file for this visit.   Signed, Fransico Him, MD  04/18/2017 1:56 PM    [facility] [place], [place], [place]  [address] Phone: [phone]; Fax: [phone]

## 2017-04-18 NOTE — Progress Notes (Deleted)
Cardiology Office Note    Date:  04/18/2017   ID:  John Mack, DOB 03/20/1941, MRN [id number]  PCP:  Prince Solian, MD  Cardiologist:  John Him, MD   No chief complaint on file.   History of Present Illness:  John Mack is a 76 y.o. male ***    Past Medical History:  Diagnosis Date  . Allergy   . Anxiety   . Arthritis   . Asthma   . Depression   . Eosinophilic esophagitis 04/27/6159   EGD 04/2014 17 Eos/hpf esophageal bxs  . Eye abnormalities    calcium deposits behind eyes  . GERD (gastroesophageal reflux disease)   . Hx of colonic polyps 12/18/2011  . Hyperlipidemia   . Hypertension   . 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 Thyroid disease    hypothyroid    Past Surgical History:  Procedure Laterality Date  . APPENDECTOMY    . COLONOSCOPY  10/02/2006, 12/18/2011  . ESOPHAGOSCOPY  12/18/2011   with Maloney dilation  . KNEE ARTHROSCOPY     both knees  . SHOULDER ARTHROSCOPY     left shoulder, both knees  . SHOULDER SURGERY     right surgery  . TONSILLECTOMY    . UPPER GASTROINTESTINAL ENDOSCOPY  02/04/2009   with Venia Minks dilation    Current Medications: No outpatient prescriptions have been marked as taking for the 04/18/17 encounter (Appointment) with Sueanne Margarita, MD.   Current Facility-Administered Medications for the 04/18/17 encounter (Appointment) with Sueanne Margarita, MD  Medication  . 0.9 %  sodium chloride infusion    Allergies:   Patient has no known allergies.   Social History   Social History  . Marital status: Married    Spouse name: N/A  . Number of children: N/A  . Years of education: N/A   Social History Main Topics  . Smoking status: Never Smoker  . Smokeless tobacco: Never Used  . Alcohol use No  . Drug use: No  . Sexual activity: Not on file   Other Topics Concern  . Not on file   Social History Narrative  . No narrative on file     Family History:  The patient's ***family history includes Mesothelioma in his  paternal aunt and paternal uncle.   ROS:   Please see the history of present illness.    ROS All other systems reviewed and are negative.  No flowsheet data found.     PHYSICAL EXAM:   VS:  There were no vitals taken for this visit.   GEN: Well nourished, well developed, in no acute distress  HEENT: normal  Neck: no JVD, carotid bruits, or masses Cardiac: ***RRR; no murmurs, rubs, or gallops,no edema.  Intact distal pulses bilaterally.  Respiratory:  clear to auscultation bilaterally, normal work of breathing GI: soft, nontender, nondistended, + BS MS: no deformity or atrophy  Skin: warm and dry, no rash Neuro:  Alert and Oriented x 3, Strength and sensation are intact Psych: euthymic mood, full affect  Wt Readings from Last 3 Encounters:  02/15/17 200 lb (90.7 kg)  02/05/17 200 lb (90.7 kg)  01/10/16 202 lb (91.6 kg)      Studies/Labs Reviewed:   EKG:  EKG is*** ordered today.  The ekg ordered today demonstrates ***  Recent Labs: No results found for requested labs within last 8760 hours.   Lipid Panel    Component Value Date/Time   CHOL  04/08/2009 0333  174        ATP III CLASSIFICATION:  <200     mg/dL   Desirable  200-239  mg/dL   Borderline High  >=240    mg/dL   High          TRIG 96 04/08/2009 0333   HDL 30 (L) 04/08/2009 0333   CHOLHDL 5.8 04/08/2009 0333   VLDL 19 04/08/2009 0333   LDLCALC (H) 04/08/2009 0333    125        Total Cholesterol/HDL:CHD Risk Coronary Heart Disease Risk Table                     Men   Women  1/2 Average Risk   3.4   3.3  Average Risk       5.0   4.4  2 X Average Risk   9.6   7.1  3 X Average Risk  23.4   11.0        Use the calculated Patient Ratio above and the CHD Risk Table to determine the patient's CHD Risk.        ATP III CLASSIFICATION (LDL):  <100     mg/dL   Optimal  100-129  mg/dL   Near or Above                    Optimal  130-159  mg/dL   Borderline  160-189  mg/dL   High  >190     mg/dL   Very  High    Additional studies/ records that were reviewed today include:  ***    ASSESSMENT:    No diagnosis found.   PLAN:  In order of problems listed above:  1. ***    Medication Adjustments/Labs and Tests Ordered: Current medicines are reviewed at length with the patient today.  Concerns regarding medicines are outlined above.  Medication changes, Labs and Tests ordered today are listed in the Patient Instructions below.  There are no Patient Instructions on file for this visit.   Signed, John Him, MD  04/18/2017 1:11 PM    [facility] [place], [place], [place]  [address] Phone: [phone]; Fax: [phone]

## 2017-05-08 ENCOUNTER — Other Ambulatory Visit (HOSPITAL_COMMUNITY): Payer: Medicare Other

## 2017-06-11 ENCOUNTER — Telehealth: Payer: Self-pay | Admitting: Cardiology

## 2017-06-11 NOTE — Telephone Encounter (Signed)
Spoke with patient today he advised his wife is not doing to well and at this time he is unable to do the stress test, monitor and echo. He said if he changes his mind he will call our office back. I have removed the order from the system at this time.   Thank you  Davy Pique

## 2017-06-18 ENCOUNTER — Telehealth: Payer: Self-pay | Admitting: Internal Medicine

## 2017-06-18 NOTE — Telephone Encounter (Signed)
Patient has had a week of terrible reflux after eating baked apples from [employer]. He will increase his pantoprazole to BID and come in and see Ellouise Newer, [place] on 06/21/17

## 2017-06-21 ENCOUNTER — Encounter (INDEPENDENT_AMBULATORY_CARE_PROVIDER_SITE_OTHER): Payer: Self-pay

## 2017-06-21 ENCOUNTER — Encounter: Payer: Self-pay | Admitting: Physician Assistant

## 2017-06-21 ENCOUNTER — Ambulatory Visit (INDEPENDENT_AMBULATORY_CARE_PROVIDER_SITE_OTHER): Payer: Medicare Other | Admitting: Physician Assistant

## 2017-06-21 VITALS — BP 134/88 | HR 72 | Ht 68.75 in | Wt 206.0 lb

## 2017-06-21 DIAGNOSIS — K228 Other specified diseases of esophagus: Secondary | ICD-10-CM | POA: Diagnosis not present

## 2017-06-21 DIAGNOSIS — K219 Gastro-esophageal reflux disease without esophagitis: Secondary | ICD-10-CM | POA: Diagnosis not present

## 2017-06-21 DIAGNOSIS — K2289 Other specified disease of esophagus: Secondary | ICD-10-CM

## 2017-06-21 NOTE — Progress Notes (Addendum)
Chief Complaint: GERD  HPI:  John Mack is a 76 year old male with past medical history of dilated cardiomyopathy, stroke and others listed below, who was referred to me by Prince Solian, MD for a complaint of worsening reflux.     Patient has been followed in our clinic previously by Dr. Carlean Purl, last seen 06/30/14 doing well without dysphagia after an endoscopy and dilation in May of that year. Patient had 17 Eos/hpf on biopsies so it was thought he possibly had eosinophilic esophagitis. He had very rare heartburn on Nexium, when necessary and added occasional H2 blocker relief 1-2 times per week. Discussed with the patient that if he has repeat dysphagia could consider topical treatment versus dilation. He then had a recent colonoscopy performed 02/15/17. Patient had one 5 mm polyp in the descending colon, diverticulosis and otherwise normal exam.   Per chart review patient was then seen at [facility] on 12/13/16 for an EGD due to dysphagia. Pathology revealed esophageal squamous mucosa with no significant pathological diagnosis. EGD showed normal esophagus which was dilated empirically and a normal stomach as well as normal duodenum. Patient also had esophageal manometry 11/10/16. We do not have these results.    Today, the patient tells me that on 06/11/17 he went to [employer] and bought baked apples, green beans and sweet potato casserole and did okay eating them, but within 15-20 minutes after eating burped the baked apples back up. He then burped again and felt as though his esophagus was "on fire". He continue taking his Protonix 40 mg twice a day and started taking Zantac daily. This sensation of "fire in my throat" lasted for 4 or 5 days. The patient was also drinking milk to help soothe this burning pain. The patient has not experienced any more symptoms within the past week and has stopped taking his added Zantac.   Patient denies fever, chills, blood in his stool, melena, change in bowel habits,  weight loss, decreased in her cecum nausea, vomiting or abdominal pain.  Past Medical History:  Diagnosis Date  . Allergy   . Anxiety   . Arthritis   . Asthma   . DCM (dilated cardiomyopathy) ([place]) 04/18/2017   ED 45% by echo at [facility]  . Depression   . Eosinophilic esophagitis 9/76/7341   EGD 04/2014 17 Eos/hpf esophageal bxs  . Eye abnormalities    calcium deposits behind eyes  . GERD (gastroesophageal reflux disease)   . Hx of colonic polyps 12/18/2011  . Hyperlipidemia   . Hypertension   . Stroke [facility])    2 TIAs  . Thyroid disease    hypothyroid    Past Surgical History:  Procedure Laterality Date  . APPENDECTOMY    . COLONOSCOPY  10/02/2006, 12/18/2011  . ESOPHAGOSCOPY  12/18/2011   with Maloney dilation  . KNEE ARTHROSCOPY     both knees  . SHOULDER ARTHROSCOPY     left shoulder, both knees  . SHOULDER SURGERY     right surgery  . TONSILLECTOMY    . UPPER GASTROINTESTINAL ENDOSCOPY  02/04/2009   with Venia Minks dilation    Current Outpatient Prescriptions  Medication Sig Dispense Refill  . ALPRAZolam (XANAX) 0.5 MG tablet Take 1 mg by mouth at bedtime as needed for sleep.     . Budesonide-Formoterol Fumarate (SYMBICORT IN) Inhale into the lungs 2 (two) times daily.    . hydrochlorothiazide (HYDRODIURIL) 25 MG tablet Take 25 mg by mouth daily.    . Levothyroxine Sodium 112 MCG CAPS  Take 100 mcg by mouth daily before breakfast.    . losartan (COZAAR) 50 MG tablet Take 50 mg by mouth 2 (two) times daily.     . Multiple Vitamins-Minerals (MULTIVITAMIN PO) Take 1 tablet by mouth daily.     . nebivolol (BYSTOLIC) 2.5 MG tablet Take 2.5 mg by mouth daily.    John Mack Glycol-Propyl Glycol (SYSTANE OP) Place 1 drop into both eyes daily.    John Mack Kitchen testosterone cypionate (DEPOTESTOTERONE CYPIONATE) 100 MG/ML injection Inject 200 mg into the muscle every 14 (fourteen) days. For IM use only    . VITAMIN B1-B12 IJ Inject 1,000 mg as directed every 30 (thirty) days.    . vitamin E 1000  UNIT capsule Take 1,000 Units by mouth 3 (three) times daily.     Current Facility-Administered Medications  Medication Dose Route Frequency Provider Last Rate Last Dose  . 0.9 %  sodium chloride infusion  500 mL Intravenous Continuous Gatha Mayer, MD        Allergies as of 06/21/2017  . (No Known Allergies)    Family History  Problem Relation Age of Onset  . Mesothelioma Paternal Aunt   . Mesothelioma Paternal Uncle   . Colon cancer Neg Hx   . Esophageal cancer Neg Hx   . Stomach cancer Neg Hx   . Rectal cancer Neg Hx     Social History   Social History  . Marital status: Married    Spouse name: N/A  . Number of children: N/A  . Years of education: N/A   Occupational History  . Not on file.   Social History Main Topics  . Smoking status: Never Smoker  . Smokeless tobacco: Never Used  . Alcohol use No  . Drug use: No  . Sexual activity: Not on file     Review of Systems:    Constitutional: No weight loss, fever or chills Cardiovascular: No chest pain Respiratory: No SOB Gastrointestinal: See HPI and otherwise negative   Physical Exam:  Vital signs: BP 134/88 (BP Location: Left Arm, Patient Position: Sitting, Cuff Size: Normal)   Pulse 72   Ht 5' 8.75" (1.746 m) Comment: height measured without shoes  Wt 206 lb (93.4 kg)   BMI 30.64 kg/m    Constitutional:   Pleasant Caucasian male appears to be in NAD, Well developed, Well nourished, alert and cooperative Respiratory: Respirations even and unlabored. Lungs clear to auscultation bilaterally.   No wheezes, crackles, or rhonchi.  Cardiovascular: Normal S1, S2. No MRG. Regular rate and rhythm. No peripheral edema, cyanosis or pallor.  Gastrointestinal:  Soft, nondistended, nontender. No rebound or guarding. Normal bowel sounds. No appreciable masses or hepatomegaly. Rectal:  Not performed.  Psychiatric:  Demonstrates good judgement and reason without abnormal affect or behaviors.  No recent labs or  imaging  Assessment: 1. GERD: Chronic for the patient typically controlled on Pantoprazole 40 mg twice a day and occasional Zantac, had to increase this recently after episode below 2. Esophageal pain: With episode of increased heartburn and reflux for a week after eating a baked apple which seem to "get burped back up into by mouth"; likely reflux esophagitis  Plan: 1. Continue on Pantoprazole 40 mg twice daily, 30-60 minutes before eating breakfast and dinner 2. Avoid baked apples  3. Continue when necessary Zantac as needed for breakthrough symptoms 4. Patient to return to clinic with Dr. Carlean Purl when necessary in the future  Ellouise Newer, PA-C [facility] 06/21/2017, 1:16 PM   Agree  with Ms. Mort Sawyers evaluation and management. Had been seen at [facility] via pulmonary at [facility] Similar results as in past - one time had esophageal eosinophilaia Maybe he has some contact food sensitivity If he has recurrent/persistent issues think impedance study of esophagus might make sense Gatha Mayer, MD, [facility]   Cc: Prince Solian, MD

## 2018-06-04 ENCOUNTER — Ambulatory Visit: Payer: Medicare Other | Admitting: Sports Medicine

## 2018-07-02 ENCOUNTER — Ambulatory Visit: Payer: Medicare Other | Admitting: Sports Medicine

## 2018-08-29 ENCOUNTER — Ambulatory Visit: Payer: Medicare Other | Admitting: Sports Medicine

## 2019-06-04 ENCOUNTER — Ambulatory Visit (HOSPITAL_COMMUNITY)
Admission: RE | Admit: 2019-06-04 | Discharge: 2019-06-04 | Disposition: A | Discharge status: Home / Self Care | Payer: Medicare Other | Source: Ambulatory Visit | Attending: Family | Admitting: Family

## 2019-06-04 ENCOUNTER — Other Ambulatory Visit: Payer: Self-pay

## 2019-06-04 ENCOUNTER — Other Ambulatory Visit (HOSPITAL_COMMUNITY): Payer: Self-pay | Admitting: Internal Medicine

## 2019-06-04 DIAGNOSIS — I639 Cerebral infarction, unspecified: Secondary | ICD-10-CM | POA: Diagnosis not present

## 2019-06-05 ENCOUNTER — Other Ambulatory Visit: Payer: Self-pay | Admitting: Internal Medicine

## 2019-06-05 DIAGNOSIS — I639 Cerebral infarction, unspecified: Secondary | ICD-10-CM

## 2019-06-23 ENCOUNTER — Encounter (HOSPITAL_COMMUNITY): Payer: Medicare Other

## 2019-06-28 ENCOUNTER — Other Ambulatory Visit: Payer: Self-pay

## 2019-06-28 ENCOUNTER — Ambulatory Visit
Admission: RE | Admit: 2019-06-28 | Discharge: 2019-06-28 | Disposition: A | Discharge status: Home / Self Care | Payer: Medicare Other | Source: Ambulatory Visit | Attending: Internal Medicine | Admitting: Internal Medicine

## 2019-06-28 DIAGNOSIS — I639 Cerebral infarction, unspecified: Secondary | ICD-10-CM

## 2019-10-15 ENCOUNTER — Telehealth: Payer: Self-pay | Admitting: Internal Medicine

## 2019-10-15 NOTE — Telephone Encounter (Signed)
Patient wants to discuss a colonoscopy.  Had a friend recently pass away with colon cancer and would like to have another colonoscopy now.  He will come in and discuss with Dr. Carlean Purl 12/02/19

## 2019-12-02 ENCOUNTER — Ambulatory Visit (INDEPENDENT_AMBULATORY_CARE_PROVIDER_SITE_OTHER): Payer: Medicare Other | Admitting: Internal Medicine

## 2019-12-02 ENCOUNTER — Encounter: Payer: Self-pay | Admitting: Internal Medicine

## 2019-12-02 VITALS — BP 114/64 | HR 72 | Temp 98.5°F | Ht 68.75 in | Wt 202.0 lb

## 2019-12-02 DIAGNOSIS — I639 Cerebral infarction, unspecified: Secondary | ICD-10-CM

## 2019-12-02 DIAGNOSIS — Z636 Dependent relative needing care at home: Secondary | ICD-10-CM

## 2019-12-02 DIAGNOSIS — E669 Obesity, unspecified: Secondary | ICD-10-CM

## 2019-12-02 DIAGNOSIS — R1031 Right lower quadrant pain: Secondary | ICD-10-CM | POA: Diagnosis not present

## 2019-12-02 DIAGNOSIS — R1032 Left lower quadrant pain: Secondary | ICD-10-CM

## 2019-12-02 DIAGNOSIS — R1030 Lower abdominal pain, unspecified: Secondary | ICD-10-CM

## 2019-12-02 DIAGNOSIS — S39011A Strain of muscle, fascia and tendon of abdomen, initial encounter: Secondary | ICD-10-CM | POA: Diagnosis not present

## 2019-12-02 NOTE — Progress Notes (Signed)
John Mack 78 y.o. 31-Mar-1941 [id number]  Assessment & Plan:   Encounter Diagnoses  Name Primary?  . Abdominal wall strain, initial encounter Yes  . Abdominal wall pain in both lower quadrants   . Abdominal wall pain in suprapubic region   . Obesity, Class I, BMI 30.0-34.9 (see actual BMI)   . Caregiver burden      Abdominal wall strain, initial encounter Based upon my exam and history I think he has an abdominal wall strain probably somewhat chronic due to lifting his wife.  I am going to refer him for physical therapy.  I do not think a colonoscopy is necessary and he was reassured about this.  Hopefully he will get some core exercises and also some lifting technique help.  He does have a Hoyer lift but apparently it is cumbersome to use.  Should he develop bowel habit changes or things like that we could do something different but no imaging or endoscopic evaluation now.  Obesity, Class I, BMI 30.0-34.9 (see actual BMI) He seems very fit for his age but losing some weight with certainly improve his health.  We talked about that a bit today.  I have given him information about low carbohydrate diets intermittent fasting and the [employer] healthy eating information.  He should stop drinking diet soda.  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 John Mack   Subjective:   Chief Complaint: lower abdominal discomfort  HPI John Mack is here today with concerns of "generalized uneasiness in his abdomen".  He relates that one of his grooms men's wedding just passed away from metastatic colon cancer and he wonders if he needs another colonoscopy.  He has had a history of adenomas he had a diminutive adenoma in 2018 and I did not recommend routine follow-up.  He is not having bowel changes.  He runs 4 to 5 miles most days of the week, and he will get some of this  discomfort then.  It does not seem to disturb his sleep too much though when he does urinate in the middle the night he will have some pain.  No urinary incontinence or bowel movement changes or fecal incontinence etc.  Of note he cares for his wife who has had 2 cerebral hemorrhages she weighs 190 pounds and she really is full care.  He will lift her from the chair to the bed.  He does have a Hoyer lift but does not always use it. No Known Allergies Current Meds  Medication Sig  . Albuterol Sulfate, sensor, (PROAIR DIGIHALER) 108 (90 Base) MCG/ACT AEPB Inhale 1 puff into the lungs as needed.  . ALPRAZolam (XANAX) 0.5 MG tablet Take 1 mg by mouth at bedtime as needed for sleep.   . Budesonide-Formoterol Fumarate (SYMBICORT IN) Inhale into the lungs 2 (two) times daily.  Marland Kitchen EPINEPHrine 0.3 mg/0.3 mL IJ SOAJ injection as needed.  . hydrochlorothiazide (HYDRODIURIL) 25 MG tablet Take 25 mg by mouth daily.  . Levothyroxine Sodium 112 MCG CAPS Take 100 mcg by mouth daily before breakfast.  . LORazepam (ATIVAN) 1 MG tablet Take by mouth. Take twice a day and 1/2 after lunch  . Multiple Vitamins-Minerals (MULTIVITAMIN PO) Take 1 tablet by mouth daily.   . nebivolol (BYSTOLIC) 2.5 MG tablet Take 2.5 mg by mouth daily.  Marland Kitchen olmesartan (BENICAR) 40 MG tablet Take 40 mg by mouth daily.  Marland Kitchen  pantoprazole (PROTONIX) 40 MG tablet Take 1 tablet by mouth 2 (two) times daily.  John Mack Glycol-Propyl Glycol (SYSTANE OP) Place 1 drop into both eyes daily.  Marland Kitchen testosterone cypionate (DEPOTESTOTERONE CYPIONATE) 100 MG/ML injection Inject 200 mg into the muscle every 14 (fourteen) days. For IM use only  . vardenafil (LEVITRA) 20 MG tablet Take 20 mg by mouth daily as needed.  Marland Kitchen VITAMIN B1-B12 IJ Inject 1,000 mg as directed every 30 (thirty) days.  . vitamin E 1000 UNIT capsule Take 1,000 Units by mouth 3 (three) times daily.   Past Medical History:  Diagnosis Date  . Allergy   . Anxiety   . Arthritis   . Asthma     . DCM (dilated cardiomyopathy) ([place]) 04/18/2017   ED 45% by echo at [facility]  . Depression   . Eosinophilic esophagitis 99991111   EGD 04/2014 17 Eos/hpf esophageal bxs  . Eye abnormalities    calcium deposits behind eyes  . GERD (gastroesophageal reflux disease)   . Hx of colonic polyps 12/18/2011  . Hyperlipidemia   . Hypertension   . Stroke [facility])    2 TIAs  . Thyroid disease    hypothyroid   Past Surgical History:  Procedure Laterality Date  . APPENDECTOMY    . COLONOSCOPY  10/02/2006, 12/18/2011  . ESOPHAGOSCOPY  12/18/2011   with Maloney dilation  . KNEE ARTHROSCOPY     both knees  . SHOULDER ARTHROSCOPY     left shoulder, both knees  . SHOULDER SURGERY     right surgery  . TONSILLECTOMY    . UPPER GASTROINTESTINAL ENDOSCOPY  02/04/2009   with Venia Minks dilation   Social History   Social History Narrative   Married his wife is an invalid and he provides full-time care   No alcohol no drugs and never smoker   family history includes Mesothelioma in his paternal aunt and paternal uncle.   Review of Systems Wt Readings from Last 3 Encounters:  12/02/19 202 lb (91.6 kg)  06/21/17 206 lb (93.4 kg)  04/18/17 203 lb 6.4 oz (92.3 kg)     Objective:   Physical Exam BP 114/64 (BP Location: Left Arm, Patient Position: Sitting, Cuff Size: Normal)   Pulse 72   Temp 98.5 F (36.9 C)   Ht 5' 8.75" (1.746 m)   Wt 202 lb (91.6 kg)   BMI 30.05 kg/m  Well-developed well-nourished elderly white man no acute distress Eyes anicteric The abdomen is protuberant and moderately obese without hernia organomegaly or mass.  He is tender in the lower quadrants in the suprapubic area and this intensifies with abdominal wall tension i.e. positive Carnett's sign.  Bowel sounds are present. The lower extremities are notable for varicosities. He is alert and oriented x3 and has an appropriate mood and affect

## 2019-12-02 NOTE — Assessment & Plan Note (Signed)
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

## 2019-12-02 NOTE — Assessment & Plan Note (Signed)
Based upon my exam and history I think he has an abdominal wall strain probably somewhat chronic due to lifting his wife.  I am going to refer him for physical therapy.  I do not think a colonoscopy is necessary and he was reassured about this.  Hopefully he will get some core exercises and also some lifting technique help.  He does have a Hoyer lift but apparently it is cumbersome to use.  Should he develop bowel habit changes or things like that we could do something different but no imaging or endoscopic evaluation now.

## 2019-12-02 NOTE — Patient Instructions (Addendum)
Here is the nutrition information we discussed:   Healthy and nutritious eating and weight loss are made to be harder than they need to be. A simplified diet approach based around eating normally, as much as you want without restricting intake too much is better, I think. You must avoid packaged foods and try to eat real foods as much as possible. Packaged foods, sugary sodas, highly processed foods taste great but are slow poisons that lead to obesity and/or poor health.  It is very helpful to take some time each week and plan your meals. Work with your spouse, partner, family to do this as much as possible. Preparing meals ahead to take to work or school is especially helpful and will save money, too. You can do this and by working together it can take less time.  Another way to help is to order food on-line for pick-up (or delivery if you can afford) and you will avoid impulse buys of unhealthy foods.  Some resources that I like are:  www.gaplesinstitute.org (Do the learning modules about healthy eating)  www.dietdoctor.com - helps with low carb diets and also can learn about and consider intermittent  fasting. If you have diabetes would not do intermittent fasting without checking with your doctor. Best to change what you eat before doing this.  Avoid all processed and packaged foods (bread, pasta, crackers, chips, etc) and beverages containing calories.  Avoid added sugars and excessive natural sugars.  Attention to how you feel if you consume artificial sweeteners.  Do they make you more hungry or raise your blood sugar?  Have small amounts of good fats such as avocado, nuts, olive oil, nut butters, olives.  Add a little cheese to your salads to make them tasty.    We are referring you for physical therapy for your abdominal wall strain. They will contact you.    I appreciate the opportunity to care for you. Silvano Rusk, MD, [facility]

## 2019-12-02 NOTE — Assessment & Plan Note (Signed)
He seems very fit for his age but losing some weight with certainly improve his health.  We talked about that a bit today.  I have given him information about low carbohydrate diets intermittent fasting and the [employer] healthy eating information.  He should stop drinking diet soda.

## 2019-12-15 ENCOUNTER — Ambulatory Visit: Payer: Medicare Other | Admitting: Physical Therapy

## 2019-12-29 ENCOUNTER — Ambulatory Visit: Payer: Medicare Other | Admitting: Physical Therapy

## 2019-12-30 ENCOUNTER — Ambulatory Visit: Payer: Medicare Other

## 2020-01-13 ENCOUNTER — Encounter: Payer: Self-pay | Admitting: Physical Therapy

## 2020-01-13 ENCOUNTER — Ambulatory Visit: Payer: Medicare Other | Attending: Internal Medicine | Admitting: Physical Therapy

## 2020-01-13 ENCOUNTER — Other Ambulatory Visit: Payer: Self-pay

## 2020-01-13 DIAGNOSIS — R252 Cramp and spasm: Secondary | ICD-10-CM | POA: Diagnosis not present

## 2020-01-13 DIAGNOSIS — M6281 Muscle weakness (generalized): Secondary | ICD-10-CM | POA: Diagnosis present

## 2020-01-13 NOTE — Therapy (Signed)
[facility] [address], [place], [place], [address] Phone: [phone]   Fax:  [phone]  Physical Therapy Evaluation  Patient Details  Name: John Mack MRN: [id number] Date of Birth: 1941-07-06 Referring Provider (PT): Gatha Mayer, MD   Encounter Date: 01/13/2020  PT End of Session - 01/13/20 0853    Visit Number  1    Date for PT Re-Evaluation  03/09/20    Authorization Type  medicare    PT Start Time  0847    PT Stop Time  0928    PT Time Calculation (min)  41 min    Activity Tolerance  Patient tolerated treatment well    Behavior During Therapy  [facility] for tasks assessed/performed       Past Medical History:  Diagnosis Date  . Allergy   . Anxiety   . Arthritis   . Asthma   . DCM (dilated cardiomyopathy) ([place]) 04/18/2017   ED 45% by echo at [facility]  . Depression   . Eosinophilic esophagitis 99991111   EGD 04/2014 17 Eos/hpf esophageal bxs  . Eye abnormalities    calcium deposits behind eyes  . GERD (gastroesophageal reflux disease)   . Hx of colonic polyps 12/18/2011  . Hyperlipidemia   . Hypertension   . Stroke [facility])    2 TIAs  . Thyroid disease    hypothyroid    Past Surgical History:  Procedure Laterality Date  . APPENDECTOMY    . COLONOSCOPY  10/02/2006, 12/18/2011  . ESOPHAGOSCOPY  12/18/2011   with Maloney dilation  . KNEE ARTHROSCOPY     both knees  . SHOULDER ARTHROSCOPY     left shoulder, both knees  . SHOULDER SURGERY     right surgery  . TONSILLECTOMY    . UPPER GASTROINTESTINAL ENDOSCOPY  02/04/2009   with Venia Minks dilation    There were no vitals filed for this visit.   Subjective Assessment - 01/13/20 0849    Subjective  Pt states has been taking care of his wife who is completely disabled.  Pt has to lift her in and out of the W/C.  Pt has someone who helps 1x every couple of weeks. Pt has mild pain when lifting his wife up and sometimes when    Pertinent History   diverticulitis    Currently in Pain?  No/denies         [facility] PT Assessment - 01/13/20 0001      Assessment   Medical Diagnosis  S39.011A (ICD-10-CM) - Abdominal wall strain, initial encounter; R10.31,R10.32 (ICD-10-CM) - Abdominal wall pain in both lower quadrants; R10.30 (ICD-10-CM) - Abdominal wall pain in suprapubic region    Referring Provider (PT)  Gatha Mayer, MD    Prior Therapy  No      Precautions   Precautions  None      Restrictions   Weight Bearing Restrictions  No      Balance Screen   Has the patient fallen in the past 6 months  No      Gap residence    Living Arrangements  Spouse/significant other      Prior Function   Level of [place] Requirements  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      Cognition   Overall Cognitive Status  Within Functional Limits for tasks assessed      ROM / Strength   AROM / PROM / Strength  AROM;PROM;Strength      AROM   Overall AROM Comments  lumbar flexion 75%      Strength   Overall Strength Comments  core 4/5; hip abduction 4/5      Flexibility   Soft Tissue Assessment /Muscle Length  yes    Hamstrings  10% limited      Palpation   Palpation comment  rectus abdominus TTP and diastasis rectus abdominus 2 fingers      Ambulation/Gait   Gait Pattern  Decreased stride length                Objective measurements completed on examination: See above findings.    Pelvic Floor Special Questions - 01/13/20 0001    Fluid intake  normal and no bowel or bladder symptoms; no pelvic pain       [facility] Adult PT Treatment/Exercise - 01/13/20 0001      Self-Care   Self-Care  Other Self-Care Comments    Other Self-Care Comments   intial HEP educated and performed             PT Education - 01/13/20 1402    Education Details  Access Code: [email or web address]    Person(s) Educated  Patient    Methods  Explanation;Demonstration;Handout;Verbal cues     Comprehension  Verbalized understanding;Returned demonstration       PT Short Term Goals - 01/13/20 1416      PT SHORT TERM GOAL #1   Title  ind with initial HEP    Time  4    Period  Weeks    Status  New    Target Date  02/10/20        PT Long Term Goals - 01/13/20 1415      PT LONG TERM GOAL #1   Title  ind with advnaced HEP    Time  8    Period  Weeks    Status  New    Target Date  03/09/20      PT LONG TERM GOAL #2   Title  diastasis reduced to <1 finger width for improved functional stability of trunk.    Time  8    Period  Weeks    Status  New    Target Date  03/09/20      PT LONG TERM GOAL #3   Title  Pt will report he can lift his wife without increased pain    Time  8    Period  Weeks    Status  New    Target Date  03/09/20             Plan - 01/13/20 1403    Clinical Impression Statement  Pt presents to clinic due to abdominal pain that has been bothering when caring for his wife.  Pt has to care for his wife and help her transfer.  Pt has tight lumbar and hamstring muscles.  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  He is very TTP and has tension throughout rectus abdominus muscles.  Pt has core weakness and hip abduction of 4/5.  He will benefit from skilled PT to address impairments so he can continue to care for his wife without increased risk of injury.    Examination-Activity Limitations  Caring for Others    Stability/Clinical Decision Making  Stable/Uncomplicated    Clinical Decision Making  Low    Rehab Potential  Excellent    PT Frequency  2x / week  PT Duration  8 weeks    PT Treatment/Interventions  ADLs/Self Care Home Management;Biofeedback;Cryotherapy;Electrical Stimulation;Moist Heat;Traction;Neuromuscular re-education;Therapeutic exercise;Therapeutic activities;Manual techniques;Taping;Dry needling;Patient/family education;Passive range of motion    PT Next Visit Plan  f/u on stretches; lumbar and hamstring  stretch; core strengthening    PT Home Exercise Plan  Access Code: [email or web address]    Consulted and Agree with Plan of Care  Patient       Patient will benefit from skilled therapeutic intervention in order to improve the following deficits and impairments:  Decreased coordination, Decreased range of motion, Pain, Increased muscle spasms, Postural dysfunction, Decreased strength  Visit Diagnosis: Cramp and spasm  Muscle weakness (generalized)     Problem List Patient Active Problem List   Diagnosis Date Noted  . Caregiver burden 12/02/2019  . Obesity, Class I, BMI 30.0-34.9 (see actual BMI) 12/02/2019  . Abdominal wall strain, initial encounter 12/02/2019  . Abdominal wall pain in suprapubic region 12/02/2019  . Heart palpitations 04/18/2017  . Benign essential HTN 04/18/2017  . DCM (dilated cardiomyopathy) ([place]) 04/18/2017  . Eosinophilic esophagitis -  [id number]  . Hx of colonic polyps 12/18/2011  . GERD 02/03/2009    Jule Ser, PT 01/13/2020, 2:29 PM  [facility] [address] [place], [place], [address] Phone: [phone]   Fax:  [phone]  Name: John Mack MRN: [id number] Date of Birth: August 02, 1941

## 2020-01-13 NOTE — Patient Instructions (Signed)
Access Code: [email or web address]  URL: [email or web address]  Date: 01/13/2020  Prepared by: Jari Favre   Exercises Seated Hamstring Stretch - 3 reps - 1 sets - 30 sec hold - 1x daily - 7x weekly Seated Piriformis Stretch with Trunk Bend - 3 reps - 1 sets - 30 sec hold - 1x daily - 7x weekly Hip Flexor Stretch with Chair - 3 reps - 1 sets - 30 sec hold - 1x daily - 7x weekly

## 2020-01-22 ENCOUNTER — Ambulatory Visit: Payer: Medicare Other | Admitting: Physical Therapy

## 2020-03-31 ENCOUNTER — Encounter: Payer: Self-pay | Admitting: Cardiology

## 2020-03-31 NOTE — Telephone Encounter (Signed)
error 

## 2020-04-05 ENCOUNTER — Ambulatory Visit: Payer: Medicare Other | Admitting: Physician Assistant

## 2020-04-22 ENCOUNTER — Ambulatory Visit: Payer: Medicare Other | Admitting: Cardiology

## 2020-04-25 NOTE — Progress Notes (Deleted)
Cardiology Office Note:    Date:  04/25/2020   ID:  John Mack, DOB [birth year], MRN [id number] PCP:  Prince Solian, MD  Cardiologist:  Fransico Him, MD    Referring MD: Prince Solian, MD   No chief complaint on file.   History of Present Illness:    John Mack is a 79 y.o. male with a hx of anxiety, depression, GERD, HTN and hyperlipidemia and has also had 2 TIA's.  He also has a history of DCM with EF of 45% and was seen by a Cardiologist at [facility] in the past.  He was last seen by me 3 years ago.  At that time he was an avid runner and would run about 23 miles once every other week.  He was felt to have runners heart given how much he runs.  He has been maintained on ARB and BB.  He has a history of palpitations in the past but tolerates them well.  He has worn a heart monitor years ago which he says showed some skipped heart beats.  When I saw him last he was supposed to get a 2D echo but that was never done.    He is here today for followup and is doing well.  He denies any chest pain or pressure, SOB, DOE, PND, orthopnea, LE edema, dizziness, palpitations or syncope. He is compliant with his meds and is tolerating meds with no SE.        Past Medical History:  Diagnosis Date  . Allergy   . Anxiety   . Arthritis   . Asthma   . DCM (dilated cardiomyopathy) ([place]) 04/18/2017   ED 45% by echo at [facility]  . Depression   . Eosinophilic esophagitis [id number]   EGD 04/2014 17 Eos/hpf esophageal bxs  . Eye abnormalities    calcium deposits behind eyes  . GERD (gastroesophageal reflux disease)   . Hx of colonic polyps 12/18/2011  . Hyperlipidemia   . Hypertension   . Stroke [facility])    2 TIAs  . Thyroid disease    hypothyroid    Past Surgical History:  Procedure Laterality Date  . APPENDECTOMY    . COLONOSCOPY  10/02/2006, 12/18/2011  . ESOPHAGOSCOPY  12/18/2011   with Maloney dilation  . KNEE ARTHROSCOPY     both knees  . SHOULDER ARTHROSCOPY     left shoulder,  both knees  . SHOULDER SURGERY     right surgery  . TONSILLECTOMY    . UPPER GASTROINTESTINAL ENDOSCOPY  02/04/2009   with Venia Minks dilation    Current Medications: No outpatient medications have been marked as taking for the 04/26/20 encounter (Appointment) with Sueanne Margarita, MD.     Allergies:   Patient has no known allergies.   Social History   Socioeconomic History  . Marital status: Married    Spouse name: Not on file  . Number of children: Not on file  . Years of education: Not on file  . Highest education level: Not on file  Occupational History  . Not on file  Tobacco Use  . Smoking status: Never Smoker  . Smokeless tobacco: Never Used  Substance and Sexual Activity  . Alcohol use: No    Alcohol/week: 0.0 standard drinks  . Drug use: No  . Sexual activity: Not on file  Other Topics Concern  . Not on file  Social History Narrative   Married his wife is an invalid and he provides full-time care  No alcohol no drugs and never smoker   Social Determinants of Radio broadcast assistant Strain:   . Difficulty of Paying Living Expenses:   Food Insecurity:   . Worried About Charity fundraiser in the Last Year:   . Arboriculturist in the Last Year:   Transportation Needs:   . Film/video editor (Medical):   Marland Kitchen Lack of Transportation (Non-Medical):   Physical Activity:   . Days of Exercise per Week:   . Minutes of Exercise per Session:   Stress:   . Feeling of Stress :   Social Connections:   . Frequency of Communication with Friends and Family:   . Frequency of Social Gatherings with Friends and Family:   . Attends Religious Services:   . Active Member of Clubs or Organizations:   . Attends Archivist Meetings:   Marland Kitchen Marital Status:      Family History: The patient's family history includes Mesothelioma in his paternal aunt and paternal uncle. There is no history of Colon cancer, Esophageal cancer, Stomach cancer, or Rectal cancer.  ROS:     Please see the history of present illness.    ROS  All other systems reviewed and negative.   EKGs/Labs/Other Studies Reviewed:    The following studies were reviewed today: nonoe  EKG:  EKG is  ordered today.  The ekg ordered today demonstrates ***  Recent Labs: No results found for requested labs within last 8760 hours.   Recent Lipid Panel    Component Value Date/Time   CHOL  04/08/2009 0333    174        ATP III CLASSIFICATION:  <200     mg/dL   Desirable  200-239  mg/dL   Borderline High  >=240    mg/dL   High          TRIG 96 04/08/2009 0333   HDL 30 (L) 04/08/2009 0333   CHOLHDL 5.8 04/08/2009 0333   VLDL 19 04/08/2009 0333   LDLCALC (H) 04/08/2009 0333    125        Total Cholesterol/HDL:CHD Risk Coronary Heart Disease Risk Table                     Men   Women  1/2 Average Risk   3.4   3.3  Average Risk       5.0   4.4  2 X Average Risk   9.6   7.1  3 X Average Risk  23.4   11.0        Use the calculated Patient Ratio above and the CHD Risk Table to determine the patient's CHD Risk.        ATP III CLASSIFICATION (LDL):  <100     mg/dL   Optimal  100-129  mg/dL   Near or Above                    Optimal  130-159  mg/dL   Borderline  160-189  mg/dL   High  >190     mg/dL   Very High    Physical Exam:    VS:  There were no vitals taken for this visit.    Wt Readings from Last 3 Encounters:  12/02/19 202 lb (91.6 kg)  06/21/17 206 lb (93.4 kg)  04/18/17 203 lb 6.4 oz (92.3 kg)     GEN:  Well nourished, well developed in no acute distress HEENT:  Normal NECK: No JVD; No carotid bruits LYMPHATICS: No lymphadenopathy CARDIAC: RRR, no murmurs, rubs, gallops RESPIRATORY:  Clear to auscultation without rales, wheezing or rhonchi  ABDOMEN: Soft, non-tender, non-distended MUSCULOSKELETAL:  No edema; No deformity  SKIN: Warm and dry NEUROLOGIC:  Alert and oriented x 3 PSYCHIATRIC:  Normal affect   ASSESSMENT:    No diagnosis found. PLAN:     In order of problems listed above:  1.  Palpitations -these have occurred for years -event monitor was ordered in 2018 but he never followed through  2.  HTN -BP controlled -continue Bystolic 2.5mg  daily and HCTZ 25mg  daily  3.  DCM - EF 45% by echo at [facility] several years ago -repeat echo at [facility] 04/23/2020 showed EF 45% with global HK, G1DD, trivial MR/AR/PR/TR  Medication Adjustments/Labs and Tests Ordered: Current medicines are reviewed at length with the patient today.  Concerns regarding medicines are outlined above.  No orders of the defined types were placed in this encounter.  No orders of the defined types were placed in this encounter.   Signed, Fransico Him, MD  04/25/2020 11:09 PM    [place]

## 2020-04-26 ENCOUNTER — Ambulatory Visit: Payer: Medicare Other | Admitting: Cardiology

## 2020-04-27 ENCOUNTER — Encounter: Payer: Self-pay | Admitting: General Practice

## 2020-11-29 ENCOUNTER — Ambulatory Visit
Admission: RE | Admit: 2020-11-29 | Discharge: 2020-11-29 | Disposition: A | Discharge status: Home / Self Care | Payer: Medicare Other | Source: Ambulatory Visit | Attending: Internal Medicine | Admitting: Internal Medicine

## 2020-11-29 ENCOUNTER — Other Ambulatory Visit: Payer: Self-pay | Admitting: Internal Medicine

## 2020-11-29 DIAGNOSIS — W19XXXA Unspecified fall, initial encounter: Secondary | ICD-10-CM

## 2020-11-30 ENCOUNTER — Ambulatory Visit (HOSPITAL_COMMUNITY): Payer: Medicare Other

## 2020-11-30 DIAGNOSIS — S065XAA Traumatic subdural hemorrhage with loss of consciousness status unknown, initial encounter: Secondary | ICD-10-CM

## 2020-11-30 DIAGNOSIS — S065X9A Traumatic subdural hemorrhage with loss of consciousness of unspecified duration, initial encounter: Secondary | ICD-10-CM

## 2020-11-30 HISTORY — DX: Traumatic subdural hemorrhage with loss of consciousness status unknown, initial encounter: S06.5XAA

## 2020-11-30 HISTORY — DX: Traumatic subdural hemorrhage with loss of consciousness of unspecified duration, initial encounter: S06.5X9A

## 2021-02-01 ENCOUNTER — Telehealth: Payer: Self-pay | Admitting: Neurology

## 2021-02-01 ENCOUNTER — Other Ambulatory Visit: Payer: Self-pay | Admitting: Neurology

## 2021-02-01 DIAGNOSIS — S065XAA Traumatic subdural hemorrhage with loss of consciousness status unknown, initial encounter: Secondary | ICD-10-CM

## 2021-02-01 DIAGNOSIS — S065X9A Traumatic subdural hemorrhage with loss of consciousness of unspecified duration, initial encounter: Secondary | ICD-10-CM

## 2021-02-01 NOTE — Telephone Encounter (Signed)
I returned phone call from the patient to [facility] answering service.  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 Majority of the blood is  low density however there is some layering of higher density blood  posteriorly due to intermittent hemorrhage. There is mass-effect  with 8 mm midline shift. 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  Patient was advised by his physician to go to [facility] to see a neurosurgeon.  Patient states she is doing fine he is not having any headache or focal neurological symptoms and would like my advice.  Patient prefers not to go to the emergency room today and is willing to see neurosurgeon on emergent basis as an outpatient.  I will put an urgent referral to [place] [facility].  But I advised the patient to call 911 and go to the ER in case he has headaches, seizures or any focal neurological symptoms.  He voiced understanding I also asked him to stop taking Goody powders and any other blood thinners

## 2021-02-03 ENCOUNTER — Other Ambulatory Visit: Payer: Self-pay | Admitting: Neurosurgery

## 2021-02-04 ENCOUNTER — Encounter (HOSPITAL_COMMUNITY): Payer: Self-pay | Admitting: Neurosurgery

## 2021-02-04 NOTE — Progress Notes (Signed)
Anesthesia Chart Review: Kathleene Hazel   Case: [id number] Date/Time: 02/07/21 1715   Procedure: Left Burr holes for subdural hematoma (Left )   Anesthesia type: General   Pre-op diagnosis: Subdural hematoma   Location: [facility] OR ROOM 21 / [place] [facility]   Surgeons: Ashok Pall, MD      DISCUSSION: Patient is a 80 year old male scheduled for the above procedure.  He fell and hit his head on hardwood floors in mid December after standing up from picking up his dog leash. He called EMS but refused transfer. He followed up with his PCP a few days later and on 11/30/20 had a CT c-spine and head which showed small right frontal SDH and SAH. He was seen by [facility] neurologist Creig Hines, MD on 01/18/21 who recommended repeat imaging. 02/01/21 CT showed resolution of small SAH but marked progression of right sided SDH. 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 On-call Neurologist Antony Contras, MD arranged referral and advised stopping blood thinners/ASA and contacting 911 if any changes in the interim.  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   Notes indicated that he a retired [employer] (served in [employer]) who is still very active (runner).  Last [facility] cardiology visit with Dr. Janeece Riggers 01/19/21 for follow-up management of frequent PVCs and bradycardia.  He had 31% PVC burden in October 2021. Excellent function status. He had an adequate PVC suppression on beta-blocker therapy, so amiodarone added at previous visit. Follow-up EKG showed no PVCs suggesting 6S full suppression of PVCs with  addition of amiodarone.  Repeat Holter monitor and echo ordered to assess overall improvement of PVC burden and reevaluate ejection fraction with antiarrhythmic drug therapy. Echo showed stable mild LV dysfunction with EF ~ 50%. No critical event on Holter monitor (no mention of PVC burden on report in Care Everywhere). Note indicates  catheter ablation may be considered in the future to avoid lifelong amiodarone therapy, but with recent SDH not felt to be a good option at that time. Six month follow-up planned.   Last [facility] pulmonology evaluation with Dr. Pearline Cables on 11/08/20. Patient feeling pretty good from a respiratory standpoint, Still with persistent cough, but not very bothersome. Felt his esophagus may need dilating again, so referred to GI. Still running, but speeds decreased. He was in bigeminy PVCs, so cardiology appointment moved up.  Continue pulmonary regimen with repeat breathing test and CT scan in 9 months planned.  Presurgical COVID-19 testing is scheduled for 02/05/21. He is a same day work-up, so labs and anesthesia team evaluation on the day of surgery. 01/19/21 EKG from [facility] requested.    VS: 01/19/21 ([facility]): Vital Sign Reading Time Taken  Blood Pressure 145/87 01/19/2021 10:23 AM EST  Pulse 60 01/19/2021 10:23 AM EST  Respiratory Rate 20 01/19/2021 10:23 AM EST  Oxygen Saturation 99% 01/19/2021 10:23 AM EST  Weight 96.5 kg (212 lb 12.8 oz) 01/19/2021 10:23 AM EST  Height 177.8 cm (5\' 10" ) 01/19/2021 10:23 AM EST  Body Mass Index 30.53     PROVIDERS: Prince Solian, MD is PCP  Marchia Meiers, MD is EP cardiologist ([facility] Everywhere) Felicity Coyer, MD is primary  cardiologist ([place]). Last visit 09/12/20. CHF stable, b-blocker increased due to PVC burden.  Theodosia Blender, MD is pulmonologist [facility] Everywhere) Silvano Rusk, MD is GI. Last visit 12/02/19.    LABS: For day of surgery. He has comparison labs from 01/19/21  ([place]) that show Cr 0.9, TSH elevated at 15.62, normal Free T4 0.85. A1c 5.8% on 09/13/20. H/H 13.4/38.3, PLT 211K on 04/12/20.     OTHER: PFTs 11/08/20 ([facility]): As outlined by Dr. Pearline Cables: "FVC is 2.93, 73% predicted. FEV1 is 2.33, 78% predicted. No obstruction. FVC is down 6.4% from 2019. DLCO is 19.50, 78.3% predicted (normal). Has been up and down over the past few years - about stable from 2019, but down from 2020 check (14.6% decline)."   6 minute walk test 06/30/19 ([facility]): Physician Interp:  Six minute walk distance is within the normal predicted range.  Oxygenation during exercise was adequate (SpO2 =>90%) without the  use of supplemental O2.  At peak exercise, the heart rate indicated cardiovascular  maximums were being approached.  Perceived dyspnea at end of exercise was moderate.    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 Majority of the blood is  low density however there is some layering of higher density blood  posteriorly due to intermittent hemorrhage. There is mass-effect  with 8 mm midline shift. 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   CT Chest 11/08/20 ([facility]): Impression:  1. Mild peripheral reticulation is unchanged since 2019, progressed from  the oldest prior studies of 2016. This is an indeterminate pattern given  the absence of significant traction bronchiectasis or honeycombing.  2. Several small ovoid lung nodules are unchanged and most consistent with  benign intrapulmonary lymph nodes based on morphology and location.    EKG: EKG 01/19/21 ([facility]): Tracing requested. Per Result Narrative: Sinus bradycardia  Left anterior fascicular block  Intraventricular conduction delay  Abnormal ECG  When compared with ECG of 08-Nov-2020 11:26,   Sinus bradycardia has replaced Sinus rhythm  I reviewed and concur with this report. Electronically signedby:GRANT, MD, AUGUSTUS ([id number]) on 01/19/2021 11:23:03 AM   CV: Echo 01/19/21 ([facility]): LEFT VENTRICLE  Size: Normal  LVH: MILD LVH CONCENTRIC  LV masses: No Masses   Closest EF: 50% (Estimated) Calc.EF: 51% (2D)   Contraction: MILD GLOBAL DECREASE         Anterior: HYPOCONTRACTILE  Lateral: HYPOCONTRACTILE  Septal: HYPOCONTRACTILE  Apical: HYPOCONTRACTILE  Inferior: HYPOCONTRACTILE  Posterior: HYPOCONTRACTILE  MILD LV DYSFUNCTION (See above) WITH MILD LVH  NORMAL RIGHT VENTRICULAR SYSTOLIC FUNCTION  VALVULAR REGURGITATION: TRIVIAL MR, MILD PR, TRIVIAL TR  NO VALVULAR STENOSIS  - Final Diagnosis: I49.3-Ventricular premature depolarization  - Compared with prior Echo study on 08/18/2020: NO SIGNIFICANT CHANGE  Cardiac event monitor 01/19/21 ([facility]): IMPRESSION:  The technical quality of the study was adequate. Patient reported events were related to a sinus mechanism +/- PACs No critical events were automatically detected   Past Medical History:  Diagnosis Date  . Allergy   . Anxiety   . Arthritis   . Asthma   . Cancer ([place])    skin  . CHF (congestive heart failure) ([facility])    chornic systolic CHF  . DCM (dilated cardiomyopathy) ([place]) 04/18/2017   ED 45% by echo at [facility]  . Depression   .  Diverticulosis   . Dysrhythmia   . Eosinophilic esophagitis 08/04/36   EGD 04/2014 17 Eos/hpf esophageal bxs  . Eye abnormalities    calcium deposits behind eyes  . Frequent PVCs   . GERD (gastroesophageal reflux disease)   . Hx of colonic polyps 12/18/2011  . Hyperlipidemia   . Hypertension   . Hypothyroidism   . SDH (subdural hematoma) ([place]) 11/30/2020  . Stroke [facility])    2 TIAs  . Thyroid disease    hypothyroid    Past Surgical History:  Procedure Laterality Date  . APPENDECTOMY    . COLONOSCOPY  10/02/2006, 12/18/2011  . ESOPHAGOSCOPY  12/18/2011   with  Maloney dilation  . KNEE ARTHROSCOPY     both knees  . SHOULDER ARTHROSCOPY     left shoulder, both knees  . SHOULDER SURGERY     right surgery  . TONSILLECTOMY    . UPPER GASTROINTESTINAL ENDOSCOPY  02/04/2009   with Venia Minks dilation    MEDICATIONS: No current facility-administered medications for this encounter.   Marland Kitchen amiodarone (PACERONE) 200 MG tablet  . amLODipine (NORVASC) 2.5 MG tablet  . metoprolol succinate (TOPROL-XL) 50 MG 24 hr tablet  . Albuterol Sulfate, sensor, (PROAIR DIGIHALER) 108 (90 Base) MCG/ACT AEPB  . ALPRAZolam (XANAX) 0.5 MG tablet  . Budesonide-Formoterol Fumarate (SYMBICORT IN)  . EPINEPHrine 0.3 mg/0.3 mL IJ SOAJ injection  . hydrochlorothiazide (HYDRODIURIL) 25 MG tablet  . Levothyroxine Sodium 112 MCG CAPS  . LORazepam (ATIVAN) 1 MG tablet  . Multiple Vitamins-Minerals (MULTIVITAMIN PO)  . nebivolol (BYSTOLIC) 2.5 MG tablet  . olmesartan (BENICAR) 40 MG tablet  . pantoprazole (PROTONIX) 40 MG tablet  . Polyethyl Glycol-Propyl Glycol (SYSTANE OP)  . testosterone cypionate (DEPOTESTOTERONE CYPIONATE) 100 MG/ML injection  . vardenafil (LEVITRA) 20 MG tablet  . VITAMIN B1-B12 IJ  . vitamin E 1000 UNIT capsule  Nebivolol changed to metoprolol. Does not currently have an Epipen.   Myra Gianotti, PA-C Surgical Short Stay/Anesthesiology [facility] Phone [phone] [facility] Phone [phone] 02/04/2021 4:18 PM

## 2021-02-04 NOTE — Anesthesia Preprocedure Evaluation (Addendum)
Anesthesia Evaluation  Patient identified by MRN, date of birth, ID band Patient awake    Reviewed: Allergy & Precautions, NPO status , Patient's Chart, lab work & pertinent test results  Airway Mallampati: II  TM Distance: >3 FB     Dental   Pulmonary asthma ,    breath sounds clear to auscultation       Cardiovascular hypertension, +CHF  + dysrhythmias  Rhythm:Regular Rate:Normal  Echo 01/19/21 ([facility]): LEFT VENTRICLE  Size: Normal  LVH: MILD LVH CONCENTRIC  LV masses: No Masses   Closest EF: 50% (Estimated) Calc.EF: 51% (2D)   Contraction: MILD GLOBAL DECREASE         Anterior: HYPOCONTRACTILE  Lateral: HYPOCONTRACTILE  Septal: HYPOCONTRACTILE  Apical: HYPOCONTRACTILE  Inferior: HYPOCONTRACTILE  Posterior: HYPOCONTRACTILE  MILD LV DYSFUNCTION (See above) WITH MILD LVH  NORMAL RIGHT VENTRICULAR SYSTOLIC FUNCTION  VALVULAR REGURGITATION: TRIVIAL MR, MILD PR, TRIVIAL TR  NO VALVULAR STENOSIS  - Final Diagnosis: I49.3-Ventricular premature depolarization  - Compared with prior Echo study on 08/18/2020: NO SIGNIFICANT CHANGE    Neuro/Psych PSYCHIATRIC DISORDERS Anxiety  Neuromuscular disease CVA    GI/Hepatic Neg liver ROS, GERD  ,  Endo/Other  Hypothyroidism   Renal/GU negative Renal ROS     Musculoskeletal  (+) Arthritis ,   Abdominal   Peds  Hematology   Anesthesia Other Findings   Reproductive/Obstetrics                            Anesthesia Physical Anesthesia Plan  ASA: III and emergent  Anesthesia Plan: General   Post-op Pain Management:    Induction: Intravenous  PONV Risk Score and Plan: 2 and Ondansetron and Dexamethasone  Airway Management Planned: Oral ETT  Additional Equipment:   Intra-op Plan:   Post-operative Plan: Possible Post-op intubation/ventilation  Informed Consent: I have reviewed the patients History and Physical, chart, labs and  discussed the procedure including the risks, benefits and alternatives for the proposed anesthesia with the patient or authorized representative who has indicated his/her understanding and acceptance.     Dental advisory given  Plan Discussed with: CRNA and Anesthesiologist  Anesthesia Plan Comments: (PAT note written 02/04/2021 by Myra Gianotti, PA-C. )       Anesthesia Quick Evaluation

## 2021-02-04 NOTE — Progress Notes (Signed)
Spoke with pt for pre-op call. Pt sees an electrophysiologist at [facility] for PVC's, heart palpitations and bradycardia. Pt denies have having CAD. Pt will be bringing his medications/med list with him day of surgery. I have updated his med list with Amiodarone, Amlodipine and Metoprolol which I found in [place] and he verified that he is taking those. Pt states he is not diabetic.   Pt is hard of hearing.   Covid test is scheduled for tomorrow. Instructed pt to quarantine after getting the test done and until he comes to the hospital on Monday. He voiced understanding.

## 2021-02-05 ENCOUNTER — Other Ambulatory Visit (HOSPITAL_COMMUNITY)
Admission: RE | Admit: 2021-02-05 | Discharge: 2021-02-05 | Disposition: A | Discharge status: Home / Self Care | Payer: Medicare Other | Source: Ambulatory Visit | Attending: Neurosurgery | Admitting: Neurosurgery

## 2021-02-05 DIAGNOSIS — Z01812 Encounter for preprocedural laboratory examination: Secondary | ICD-10-CM | POA: Insufficient documentation

## 2021-02-05 DIAGNOSIS — Z20822 Contact with and (suspected) exposure to covid-19: Secondary | ICD-10-CM | POA: Insufficient documentation

## 2021-02-05 LAB — SARS CORONAVIRUS 2 (TAT 6-24 HRS): SARS Coronavirus 2: NEGATIVE

## 2021-02-07 ENCOUNTER — Inpatient Hospital Stay (HOSPITAL_COMMUNITY): Payer: Medicare Other | Admitting: Vascular Surgery

## 2021-02-07 ENCOUNTER — Encounter (HOSPITAL_COMMUNITY): Payer: Self-pay | Admitting: Neurosurgery

## 2021-02-07 ENCOUNTER — Encounter (HOSPITAL_COMMUNITY)
Admission: RE | Disposition: A | Discharge status: Rehabilitation Facility | Payer: Self-pay | Source: Home / Self Care | Attending: Neurosurgery

## 2021-02-07 ENCOUNTER — Other Ambulatory Visit: Payer: Self-pay

## 2021-02-07 ENCOUNTER — Inpatient Hospital Stay (HOSPITAL_COMMUNITY)
Admission: RE | Admit: 2021-02-07 | Discharge: 2021-02-16 | DRG: 064 | Disposition: A | Discharge status: Rehabilitation Facility | Payer: Medicare Other | Attending: Neurosurgery | Admitting: Neurosurgery

## 2021-02-07 DIAGNOSIS — J45909 Unspecified asthma, uncomplicated: Secondary | ICD-10-CM | POA: Diagnosis present

## 2021-02-07 DIAGNOSIS — Z20822 Contact with and (suspected) exposure to covid-19: Secondary | ICD-10-CM | POA: Diagnosis present

## 2021-02-07 DIAGNOSIS — E785 Hyperlipidemia, unspecified: Secondary | ICD-10-CM | POA: Diagnosis present

## 2021-02-07 DIAGNOSIS — S065X9D Traumatic subdural hemorrhage with loss of consciousness of unspecified duration, subsequent encounter: Secondary | ICD-10-CM | POA: Diagnosis not present

## 2021-02-07 DIAGNOSIS — R0683 Snoring: Secondary | ICD-10-CM | POA: Diagnosis present

## 2021-02-07 DIAGNOSIS — Z86010 Personal history of colonic polyps: Secondary | ICD-10-CM | POA: Diagnosis not present

## 2021-02-07 DIAGNOSIS — I420 Dilated cardiomyopathy: Secondary | ICD-10-CM | POA: Diagnosis present

## 2021-02-07 DIAGNOSIS — F32A Depression, unspecified: Secondary | ICD-10-CM | POA: Diagnosis present

## 2021-02-07 DIAGNOSIS — Z8673 Personal history of transient ischemic attack (TIA), and cerebral infarction without residual deficits: Secondary | ICD-10-CM | POA: Diagnosis not present

## 2021-02-07 DIAGNOSIS — R55 Syncope and collapse: Secondary | ICD-10-CM | POA: Diagnosis not present

## 2021-02-07 DIAGNOSIS — I10 Essential (primary) hypertension: Secondary | ICD-10-CM | POA: Diagnosis not present

## 2021-02-07 DIAGNOSIS — Z79890 Hormone replacement therapy (postmenopausal): Secondary | ICD-10-CM

## 2021-02-07 DIAGNOSIS — E871 Hypo-osmolality and hyponatremia: Secondary | ICD-10-CM

## 2021-02-07 DIAGNOSIS — D649 Anemia, unspecified: Secondary | ICD-10-CM | POA: Diagnosis present

## 2021-02-07 DIAGNOSIS — R230 Cyanosis: Secondary | ICD-10-CM | POA: Diagnosis present

## 2021-02-07 DIAGNOSIS — I959 Hypotension, unspecified: Secondary | ICD-10-CM | POA: Diagnosis present

## 2021-02-07 DIAGNOSIS — S065XAA Traumatic subdural hemorrhage with loss of consciousness status unknown, initial encounter: Secondary | ICD-10-CM | POA: Diagnosis present

## 2021-02-07 DIAGNOSIS — I5042 Chronic combined systolic (congestive) and diastolic (congestive) heart failure: Secondary | ICD-10-CM | POA: Diagnosis present

## 2021-02-07 DIAGNOSIS — I493 Ventricular premature depolarization: Secondary | ICD-10-CM | POA: Diagnosis present

## 2021-02-07 DIAGNOSIS — I2692 Saddle embolus of pulmonary artery without acute cor pulmonale: Secondary | ICD-10-CM | POA: Diagnosis present

## 2021-02-07 DIAGNOSIS — E039 Hypothyroidism, unspecified: Secondary | ICD-10-CM | POA: Diagnosis present

## 2021-02-07 DIAGNOSIS — E876 Hypokalemia: Secondary | ICD-10-CM | POA: Diagnosis not present

## 2021-02-07 DIAGNOSIS — F419 Anxiety disorder, unspecified: Secondary | ICD-10-CM | POA: Diagnosis present

## 2021-02-07 DIAGNOSIS — Z85828 Personal history of other malignant neoplasm of skin: Secondary | ICD-10-CM

## 2021-02-07 DIAGNOSIS — Z79899 Other long term (current) drug therapy: Secondary | ICD-10-CM

## 2021-02-07 DIAGNOSIS — R270 Ataxia, unspecified: Secondary | ICD-10-CM | POA: Diagnosis present

## 2021-02-07 DIAGNOSIS — R0902 Hypoxemia: Secondary | ICD-10-CM | POA: Diagnosis present

## 2021-02-07 DIAGNOSIS — K5900 Constipation, unspecified: Secondary | ICD-10-CM | POA: Diagnosis present

## 2021-02-07 DIAGNOSIS — I2602 Saddle embolus of pulmonary artery with acute cor pulmonale: Secondary | ICD-10-CM | POA: Diagnosis not present

## 2021-02-07 DIAGNOSIS — R61 Generalized hyperhidrosis: Secondary | ICD-10-CM | POA: Diagnosis present

## 2021-02-07 DIAGNOSIS — Z9049 Acquired absence of other specified parts of digestive tract: Secondary | ICD-10-CM | POA: Diagnosis not present

## 2021-02-07 DIAGNOSIS — Z808 Family history of malignant neoplasm of other organs or systems: Secondary | ICD-10-CM | POA: Diagnosis not present

## 2021-02-07 DIAGNOSIS — K219 Gastro-esophageal reflux disease without esophagitis: Secondary | ICD-10-CM | POA: Diagnosis present

## 2021-02-07 DIAGNOSIS — I5022 Chronic systolic (congestive) heart failure: Secondary | ICD-10-CM | POA: Diagnosis not present

## 2021-02-07 DIAGNOSIS — I110 Hypertensive heart disease with heart failure: Secondary | ICD-10-CM | POA: Diagnosis present

## 2021-02-07 DIAGNOSIS — G441 Vascular headache, not elsewhere classified: Secondary | ICD-10-CM | POA: Diagnosis not present

## 2021-02-07 DIAGNOSIS — I6203 Nontraumatic chronic subdural hemorrhage: Secondary | ICD-10-CM | POA: Diagnosis present

## 2021-02-07 DIAGNOSIS — Z7951 Long term (current) use of inhaled steroids: Secondary | ICD-10-CM

## 2021-02-07 DIAGNOSIS — I2699 Other pulmonary embolism without acute cor pulmonale: Secondary | ICD-10-CM

## 2021-02-07 DIAGNOSIS — I5032 Chronic diastolic (congestive) heart failure: Secondary | ICD-10-CM | POA: Diagnosis present

## 2021-02-07 DIAGNOSIS — R9431 Abnormal electrocardiogram [ECG] [EKG]: Secondary | ICD-10-CM | POA: Diagnosis not present

## 2021-02-07 DIAGNOSIS — S065X9A Traumatic subdural hemorrhage with loss of consciousness of unspecified duration, initial encounter: Secondary | ICD-10-CM | POA: Diagnosis present

## 2021-02-07 DIAGNOSIS — I2609 Other pulmonary embolism with acute cor pulmonale: Secondary | ICD-10-CM | POA: Diagnosis not present

## 2021-02-07 DIAGNOSIS — W19XXXD Unspecified fall, subsequent encounter: Secondary | ICD-10-CM | POA: Diagnosis present

## 2021-02-07 DIAGNOSIS — I351 Nonrheumatic aortic (valve) insufficiency: Secondary | ICD-10-CM | POA: Diagnosis not present

## 2021-02-07 DIAGNOSIS — I361 Nonrheumatic tricuspid (valve) insufficiency: Secondary | ICD-10-CM | POA: Diagnosis not present

## 2021-02-07 HISTORY — DX: Ventricular premature depolarization: I49.3

## 2021-02-07 HISTORY — DX: Heart failure, unspecified: I50.9

## 2021-02-07 HISTORY — DX: Diverticulosis of intestine, part unspecified, without perforation or abscess without bleeding: K57.90

## 2021-02-07 HISTORY — PX: BURR HOLE: SHX908

## 2021-02-07 HISTORY — DX: Cardiac arrhythmia, unspecified: I49.9

## 2021-02-07 HISTORY — DX: Malignant (primary) neoplasm, unspecified: C80.1

## 2021-02-07 HISTORY — DX: Hypothyroidism, unspecified: E03.9

## 2021-02-07 LAB — BASIC METABOLIC PANEL
Anion gap: 9 (ref 5–15)
BUN: 12 mg/dL (ref 8–23)
CO2: 26 mmol/L (ref 22–32)
Calcium: 9.1 mg/dL (ref 8.9–10.3)
Chloride: 91 mmol/L — ABNORMAL LOW (ref 98–111)
Creatinine, Ser: 0.88 mg/dL (ref 0.61–1.24)
GFR, Estimated: >60 mL/min (ref >60)
Glucose, Bld: 92 mg/dL (ref 70–99)
Potassium: 3.7 mmol/L (ref 3.5–5.1)
Sodium: 126 mmol/L — ABNORMAL LOW (ref 135–145)

## 2021-02-07 LAB — CBC
HCT: 38.5 % — ABNORMAL LOW (ref 39.0–52.0)
Hemoglobin: 13.1 g/dL (ref 13.0–17.0)
MCH: 31 pg (ref 26.0–34.0)
MCHC: 34 g/dL (ref 30.0–36.0)
MCV: 91.2 fL (ref 80.0–100.0)
Platelets: 315 10*3/uL (ref 150–400)
RBC: 4.22 MIL/uL (ref 4.22–5.81)
RDW: 13.7 % (ref 11.5–15.5)
WBC: 6.5 10*3/uL (ref 4.0–10.5)
nRBC: 0 % (ref 0.0–0.2)

## 2021-02-07 LAB — TYPE AND SCREEN
ABO/RH(D): A POS
Antibody Screen: NEGATIVE

## 2021-02-07 LAB — MRSA PCR SCREENING: MRSA by PCR: NEGATIVE

## 2021-02-07 LAB — ABO/RH: ABO/RH(D): A POS

## 2021-02-07 SURGERY — CREATION, CRANIAL BURR HOLE
Anesthesia: General | Laterality: Right

## 2021-02-07 MED ORDER — MOMETASONE FURO-FORMOTEROL FUM 100-5 MCG/ACT IN AERO
2.0000 | INHALATION_SPRAY | Freq: Two times a day (BID) | RESPIRATORY_TRACT | Status: DC
  Administered 2021-02-08 – 2021-02-16 (×17): 2 via RESPIRATORY_TRACT
  Filled 2021-02-07 (×2): qty 8.8

## 2021-02-07 MED ORDER — CEFAZOLIN SODIUM-DEXTROSE 2-3 GM-%(50ML) IV SOLR
INTRAVENOUS | Status: DC | PRN
  Administered 2021-02-07: 2 g via INTRAVENOUS

## 2021-02-07 MED ORDER — MORPHINE SULFATE (PF) 2 MG/ML IV SOLN
1.0000 mg | INTRAVENOUS | Status: DC | PRN
  Administered 2021-02-08 (×2): 2 mg via INTRAVENOUS
  Filled 2021-02-07 (×2): qty 1

## 2021-02-07 MED ORDER — EPINEPHRINE 0.3 MG/0.3ML IJ SOAJ: 0.3000 mg | INTRAMUSCULAR | Status: DC | PRN

## 2021-02-07 MED ORDER — IRBESARTAN 150 MG PO TABS
300.0000 mg | ORAL_TABLET | Freq: Every day | ORAL | Status: DC
  Administered 2021-02-08 – 2021-02-10 (×3): 300 mg via ORAL
  Filled 2021-02-07 (×3): qty 2

## 2021-02-07 MED ORDER — NALOXONE HCL 0.4 MG/ML IJ SOLN: 0.0800 mg | INTRAMUSCULAR | Status: DC | PRN

## 2021-02-07 MED ORDER — LEVETIRACETAM IN NACL 500 MG/100ML IV SOLN
500.0000 mg | Freq: Two times a day (BID) | INTRAVENOUS | Status: DC
  Administered 2021-02-07 – 2021-02-09 (×4): 500 mg via INTRAVENOUS
  Filled 2021-02-07 (×4): qty 100

## 2021-02-07 MED ORDER — LABETALOL HCL 5 MG/ML IV SOLN
10.0000 mg | INTRAVENOUS | Status: DC | PRN
  Administered 2021-02-07: 20 mg via INTRAVENOUS
  Filled 2021-02-07 (×2): qty 8

## 2021-02-07 MED ORDER — AMLODIPINE BESYLATE 2.5 MG PO TABS
2.5000 mg | ORAL_TABLET | Freq: Every day | ORAL | Status: DC
  Administered 2021-02-08 – 2021-02-10 (×3): 2.5 mg via ORAL
  Filled 2021-02-07 (×3): qty 1

## 2021-02-07 MED ORDER — FENTANYL CITRATE (PF) 250 MCG/5ML IJ SOLN
INTRAMUSCULAR | Status: AC
  Filled 2021-02-07: qty 5

## 2021-02-07 MED ORDER — ACETAMINOPHEN 325 MG PO TABS
650.0000 mg | ORAL_TABLET | ORAL | Status: DC | PRN
  Administered 2021-02-09 – 2021-02-16 (×12): 650 mg via ORAL
  Filled 2021-02-07 (×12): qty 2

## 2021-02-07 MED ORDER — CHLORHEXIDINE GLUCONATE CLOTH 2 % EX PADS
6.0000 | MEDICATED_PAD | Freq: Every day | CUTANEOUS | Status: DC
  Administered 2021-02-07 – 2021-02-16 (×9): 6 via TOPICAL

## 2021-02-07 MED ORDER — NEBIVOLOL HCL 2.5 MG PO TABS: 2.5000 mg | ORAL_TABLET | Freq: Every day | ORAL | Status: DC

## 2021-02-07 MED ORDER — LIDOCAINE 2% (20 MG/ML) 5 ML SYRINGE
INTRAMUSCULAR | Status: AC
  Filled 2021-02-07: qty 5

## 2021-02-07 MED ORDER — PROMETHAZINE HCL 25 MG PO TABS
12.5000 mg | ORAL_TABLET | ORAL | Status: DC | PRN
Start: 2021-02-07 — End: 2021-02-16

## 2021-02-07 MED ORDER — THROMBIN 5000 UNITS EX SOLR
CUTANEOUS | Status: AC
  Filled 2021-02-07: qty 5000

## 2021-02-07 MED ORDER — METOPROLOL SUCCINATE ER 50 MG PO TB24
50.0000 mg | ORAL_TABLET | Freq: Two times a day (BID) | ORAL | Status: DC
  Administered 2021-02-07 – 2021-02-10 (×4): 50 mg via ORAL
  Filled 2021-02-07 (×7): qty 1

## 2021-02-07 MED ORDER — THROMBIN 20000 UNITS EX SOLR
CUTANEOUS | Status: AC
  Filled 2021-02-07: qty 20000

## 2021-02-07 MED ORDER — AMIODARONE HCL 200 MG PO TABS
200.0000 mg | ORAL_TABLET | Freq: Every day | ORAL | Status: DC
  Administered 2021-02-08 – 2021-02-16 (×9): 200 mg via ORAL
  Filled 2021-02-07 (×9): qty 1

## 2021-02-07 MED ORDER — ROCURONIUM BROMIDE 10 MG/ML (PF) SYRINGE
PREFILLED_SYRINGE | INTRAVENOUS | Status: DC | PRN
  Administered 2021-02-07: 50 mg via INTRAVENOUS

## 2021-02-07 MED ORDER — BACITRACIN ZINC 500 UNIT/GM EX OINT
TOPICAL_OINTMENT | CUTANEOUS | Status: AC
  Filled 2021-02-07: qty 28.35

## 2021-02-07 MED ORDER — FENTANYL CITRATE (PF) 250 MCG/5ML IJ SOLN
INTRAMUSCULAR | Status: DC | PRN
  Administered 2021-02-07: 50 ug via INTRAVENOUS
  Administered 2021-02-07: 100 ug via INTRAVENOUS
  Administered 2021-02-07: 50 ug via INTRAVENOUS

## 2021-02-07 MED ORDER — PROPOFOL 10 MG/ML IV BOLUS
INTRAVENOUS | Status: DC | PRN
  Administered 2021-02-07: 10 mg via INTRAVENOUS

## 2021-02-07 MED ORDER — EPHEDRINE SULFATE-NACL 50-0.9 MG/10ML-% IV SOSY
PREFILLED_SYRINGE | INTRAVENOUS | Status: DC | PRN
  Administered 2021-02-07: 10 mg via INTRAVENOUS

## 2021-02-07 MED ORDER — LORAZEPAM 1 MG PO TABS
1.0000 mg | ORAL_TABLET | Freq: Two times a day (BID) | ORAL | Status: DC
  Administered 2021-02-07 – 2021-02-15 (×15): 1 mg via ORAL
  Filled 2021-02-07 (×17): qty 1

## 2021-02-07 MED ORDER — ALBUTEROL SULFATE HFA 108 (90 BASE) MCG/ACT IN AERS: 1.0000 | INHALATION_SPRAY | Freq: Four times a day (QID) | RESPIRATORY_TRACT | Status: DC | PRN

## 2021-02-07 MED ORDER — BACITRACIN ZINC 500 UNIT/GM EX OINT
TOPICAL_OINTMENT | CUTANEOUS | Status: DC | PRN
  Administered 2021-02-07: 1 via TOPICAL

## 2021-02-07 MED ORDER — POTASSIUM CHLORIDE IN NACL 20-0.9 MEQ/L-% IV SOLN
INTRAVENOUS | Status: DC
  Filled 2021-02-07 (×5): qty 1000

## 2021-02-07 MED ORDER — PANTOPRAZOLE SODIUM 40 MG PO TBEC: 40.0000 mg | DELAYED_RELEASE_TABLET | Freq: Two times a day (BID) | ORAL | Status: DC

## 2021-02-07 MED ORDER — PANTOPRAZOLE SODIUM 40 MG IV SOLR
40.0000 mg | Freq: Every day | INTRAVENOUS | Status: DC
  Administered 2021-02-07: 40 mg via INTRAVENOUS
  Filled 2021-02-07: qty 40

## 2021-02-07 MED ORDER — ROCURONIUM BROMIDE 10 MG/ML (PF) SYRINGE
PREFILLED_SYRINGE | INTRAVENOUS | Status: AC
  Filled 2021-02-07: qty 10

## 2021-02-07 MED ORDER — BUDESONIDE-FORMOTEROL FUMARATE 80-4.5 MCG/ACT IN AERO
2.0000 | INHALATION_SPRAY | Freq: Two times a day (BID) | RESPIRATORY_TRACT | Status: DC
  Filled 2021-02-07: qty 6.9

## 2021-02-07 MED ORDER — ALPRAZOLAM 0.5 MG PO TABS
1.0000 mg | ORAL_TABLET | Freq: Every evening | ORAL | Status: DC | PRN
  Administered 2021-02-07 – 2021-02-16 (×8): 1 mg via ORAL
  Filled 2021-02-07 (×8): qty 2

## 2021-02-07 MED ORDER — LACTATED RINGERS IV SOLN: INTRAVENOUS | Status: DC | PRN

## 2021-02-07 MED ORDER — ONDANSETRON HCL 4 MG/2ML IJ SOLN
INTRAMUSCULAR | Status: DC | PRN
  Administered 2021-02-07: 4 mg via INTRAVENOUS

## 2021-02-07 MED ORDER — ORAL CARE MOUTH RINSE: 15.0000 mL | Freq: Once | OROMUCOSAL | Status: DC

## 2021-02-07 MED ORDER — ACETAMINOPHEN 650 MG RE SUPP: 650.0000 mg | RECTAL | Status: DC | PRN

## 2021-02-07 MED ORDER — HEPARIN SODIUM (PORCINE) 5000 UNIT/ML IJ SOLN
5000.0000 [IU] | Freq: Three times a day (TID) | INTRAMUSCULAR | Status: DC
  Administered 2021-02-07 – 2021-02-10 (×9): 5000 [IU] via SUBCUTANEOUS
  Filled 2021-02-07 (×9): qty 1

## 2021-02-07 MED ORDER — FENTANYL CITRATE (PF) 100 MCG/2ML IJ SOLN: 25.0000 ug | INTRAMUSCULAR | Status: DC | PRN

## 2021-02-07 MED ORDER — LIDOCAINE-EPINEPHRINE 0.5 %-1:200000 IJ SOLN
INTRAMUSCULAR | Status: DC | PRN
  Administered 2021-02-07: 6 mL

## 2021-02-07 MED ORDER — LABETALOL HCL 5 MG/ML IV SOLN
INTRAVENOUS | Status: AC
  Administered 2021-02-07: 20 mg via INTRAVENOUS
  Filled 2021-02-07: qty 4

## 2021-02-07 MED ORDER — ONDANSETRON HCL 4 MG PO TABS
4.0000 mg | ORAL_TABLET | ORAL | Status: DC | PRN
  Administered 2021-02-11: 4 mg via ORAL
  Filled 2021-02-07: qty 1

## 2021-02-07 MED ORDER — CEFAZOLIN SODIUM-DEXTROSE 1-4 GM/50ML-% IV SOLN
1.0000 g | Freq: Three times a day (TID) | INTRAVENOUS | Status: DC
  Administered 2021-02-08 – 2021-02-16 (×27): 1 g via INTRAVENOUS
  Filled 2021-02-07 (×35): qty 50

## 2021-02-07 MED ORDER — DEXAMETHASONE SODIUM PHOSPHATE 10 MG/ML IJ SOLN
INTRAMUSCULAR | Status: DC | PRN
  Administered 2021-02-07: 10 mg via INTRAVENOUS

## 2021-02-07 MED ORDER — LORAZEPAM 0.5 MG PO TABS
0.5000 mg | ORAL_TABLET | Freq: Every day | ORAL | Status: DC
  Administered 2021-02-09 – 2021-02-15 (×6): 0.5 mg via ORAL
  Filled 2021-02-07 (×7): qty 1

## 2021-02-07 MED ORDER — HYDROCODONE-ACETAMINOPHEN 5-325 MG PO TABS
1.0000 | ORAL_TABLET | ORAL | Status: DC | PRN
  Administered 2021-02-07 – 2021-02-16 (×13): 1 via ORAL
  Filled 2021-02-07 (×13): qty 1

## 2021-02-07 MED ORDER — LACTATED RINGERS IV SOLN: INTRAVENOUS | Status: DC

## 2021-02-07 MED ORDER — POLYVINYL ALCOHOL 1.4 % OP SOLN: 1.0000 [drp] | Freq: Every day | OPHTHALMIC | Status: DC | PRN

## 2021-02-07 MED ORDER — PHENYLEPHRINE HCL-NACL 10-0.9 MG/250ML-% IV SOLN
INTRAVENOUS | Status: DC | PRN
  Administered 2021-02-07: 50 ug/min via INTRAVENOUS

## 2021-02-07 MED ORDER — 0.9 % SODIUM CHLORIDE (POUR BTL) OPTIME
TOPICAL | Status: DC | PRN
  Administered 2021-02-07: 3000 mL

## 2021-02-07 MED ORDER — ONDANSETRON HCL 4 MG/2ML IJ SOLN
INTRAMUSCULAR | Status: AC
  Filled 2021-02-07: qty 2

## 2021-02-07 MED ORDER — CEFAZOLIN SODIUM-DEXTROSE 2-4 GM/100ML-% IV SOLN
INTRAVENOUS | Status: AC
  Filled 2021-02-07: qty 100

## 2021-02-07 MED ORDER — LIDOCAINE-EPINEPHRINE 0.5 %-1:200000 IJ SOLN
INTRAMUSCULAR | Status: AC
  Filled 2021-02-07: qty 1

## 2021-02-07 MED ORDER — VITAMIN E 45 MG (100 UNIT) PO CAPS
1000.0000 [IU] | ORAL_CAPSULE | Freq: Three times a day (TID) | ORAL | Status: DC
  Administered 2021-02-08 – 2021-02-09 (×6): 1000 [IU] via ORAL
  Filled 2021-02-07 (×8): qty 10

## 2021-02-07 MED ORDER — CHLORHEXIDINE GLUCONATE 0.12 % MT SOLN: 15.0000 mL | Freq: Once | OROMUCOSAL | Status: DC

## 2021-02-07 MED ORDER — ADULT MULTIVITAMIN W/MINERALS CH
ORAL_TABLET | Freq: Every day | ORAL | Status: DC
  Administered 2021-02-08 – 2021-02-16 (×9): 1 via ORAL
  Filled 2021-02-07 (×9): qty 1

## 2021-02-07 MED ORDER — SUGAMMADEX SODIUM 200 MG/2ML IV SOLN
INTRAVENOUS | Status: DC | PRN
  Administered 2021-02-07: 50 mg via INTRAVENOUS
  Administered 2021-02-07: 150 mg via INTRAVENOUS

## 2021-02-07 MED ORDER — ONDANSETRON HCL 4 MG/2ML IJ SOLN: 4.0000 mg | INTRAMUSCULAR | Status: DC | PRN

## 2021-02-07 MED ORDER — PHENYLEPHRINE 40 MCG/ML (10ML) SYRINGE FOR IV PUSH (FOR BLOOD PRESSURE SUPPORT)
PREFILLED_SYRINGE | INTRAVENOUS | Status: DC | PRN
  Administered 2021-02-07: 120 ug via INTRAVENOUS
  Administered 2021-02-07: 80 ug via INTRAVENOUS

## 2021-02-07 MED ORDER — LIDOCAINE 2% (20 MG/ML) 5 ML SYRINGE
INTRAMUSCULAR | Status: DC | PRN
  Administered 2021-02-07: 100 mg via INTRAVENOUS

## 2021-02-07 MED ORDER — HYDROCHLOROTHIAZIDE 25 MG PO TABS
25.0000 mg | ORAL_TABLET | Freq: Every day | ORAL | Status: DC
  Administered 2021-02-08 – 2021-02-10 (×3): 25 mg via ORAL
  Filled 2021-02-07 (×3): qty 1

## 2021-02-07 MED ORDER — LEVOTHYROXINE SODIUM 112 MCG PO TABS
112.0000 ug | ORAL_TABLET | Freq: Every day | ORAL | Status: DC
  Administered 2021-02-08 – 2021-02-16 (×9): 112 ug via ORAL
  Filled 2021-02-07 (×9): qty 1

## 2021-02-07 MED ORDER — POLYETHYL GLYCOL-PROPYL GLYCOL 0.4-0.3 % OP GEL: Freq: Every day | OPHTHALMIC | Status: DC

## 2021-02-07 SURGICAL SUPPLY — 56 items
APL SKNCLS STERI-STRIP NONHPOA (GAUZE/BANDAGES/DRESSINGS)
BENZOIN TINCTURE PRP APPL 2/3 (GAUZE/BANDAGES/DRESSINGS) IMPLANT
BLADE CLIPPER SURG (BLADE) ×2 IMPLANT
BNDG ADH 1X3 SHEER STRL LF (GAUZE/BANDAGES/DRESSINGS) ×2 IMPLANT
BNDG ADH THN 3X1 STRL LF (GAUZE/BANDAGES/DRESSINGS) ×1
BNDG GAUZE ELAST 4 BULKY (GAUZE/BANDAGES/DRESSINGS) IMPLANT
BUR ACORN 6.0 PRECISION (BURR) ×2 IMPLANT
BUR MATCHSTICK NEURO 3.0 LAGG (BURR) ×2 IMPLANT
BUR SPIRAL ROUTER 2.3 (BUR) IMPLANT
CANISTER SUCT 3000ML PPV (MISCELLANEOUS) ×2 IMPLANT
CARTRIDGE OIL MAESTRO DRILL (MISCELLANEOUS) ×1 IMPLANT
CATH VENTRIC 35X38 W/TROCAR LG (CATHETERS) ×2 IMPLANT
CLIP VESOCCLUDE MED 6/CT (CLIP) IMPLANT
COVER WAND RF STERILE (DRAPES) ×2 IMPLANT
DIFFUSER DRILL AIR PNEUMATIC (MISCELLANEOUS) ×2 IMPLANT
DRAPE NEUROLOGICAL W/INCISE (DRAPES) ×2 IMPLANT
DRAPE SURG 17X23 STRL (DRAPES) IMPLANT
DRAPE WARM FLUID 44X44 (DRAPES) ×2 IMPLANT
DURAPREP 6ML APPLICATOR 50/CS (WOUND CARE) ×2 IMPLANT
ELECT REM PT RETURN 9FT ADLT (ELECTROSURGICAL) ×2
ELECTRODE REM PT RTRN 9FT ADLT (ELECTROSURGICAL) ×1 IMPLANT
EVACUATOR SILICONE 100CC (DRAIN) IMPLANT
GAUZE SPONGE 4X4 12PLY STRL (GAUZE/BANDAGES/DRESSINGS) ×2 IMPLANT
GLOVE ECLIPSE 6.5 STRL STRAW (GLOVE) ×2 IMPLANT
GLOVE EXAM NITRILE XL STR (GLOVE) IMPLANT
GOWN STRL REUS W/ TWL LRG LVL3 (GOWN DISPOSABLE) ×2 IMPLANT
GOWN STRL REUS W/ TWL XL LVL3 (GOWN DISPOSABLE) IMPLANT
GOWN STRL REUS W/TWL 2XL LVL3 (GOWN DISPOSABLE) IMPLANT
GOWN STRL REUS W/TWL LRG LVL3 (GOWN DISPOSABLE) ×4
GOWN STRL REUS W/TWL XL LVL3 (GOWN DISPOSABLE)
HEMOSTAT POWDER KIT SURGIFOAM (HEMOSTASIS) IMPLANT
HEMOSTAT SURGICEL 2X14 (HEMOSTASIS) IMPLANT
KIT BASIN OR (CUSTOM PROCEDURE TRAY) ×2 IMPLANT
KIT DRAIN CSF ACCUDRAIN (MISCELLANEOUS) ×2 IMPLANT
KIT TURNOVER KIT B (KITS) ×2 IMPLANT
NEEDLE HYPO 25X1 1.5 SAFETY (NEEDLE) ×2 IMPLANT
NS IRRIG 1000ML POUR BTL (IV SOLUTION) ×2 IMPLANT
OIL CARTRIDGE MAESTRO DRILL (MISCELLANEOUS) ×2
PACK CRANIOTOMY CUSTOM (CUSTOM PROCEDURE TRAY) ×2 IMPLANT
PATTIES SURGICAL .5 X.5 (GAUZE/BANDAGES/DRESSINGS) IMPLANT
PATTIES SURGICAL .5 X3 (DISPOSABLE) IMPLANT
PATTIES SURGICAL 1X1 (DISPOSABLE) IMPLANT
SPONGE NEURO XRAY DETECT 1X3 (DISPOSABLE) IMPLANT
SPONGE SURGIFOAM ABS GEL 100 (HEMOSTASIS) IMPLANT
STAPLER VISISTAT 35W (STAPLE) ×2 IMPLANT
SUT ETHILON 3 0 FSL (SUTURE) IMPLANT
SUT ETHILON 3 0 PS 1 (SUTURE) IMPLANT
SUT NURALON 4 0 TR CR/8 (SUTURE) ×2 IMPLANT
SUT STEEL 0 (SUTURE)
SUT STEEL 0 18XMFL TIE 17 (SUTURE) IMPLANT
SUT VIC AB 2-0 CT2 18 VCP726D (SUTURE) ×2 IMPLANT
TOWEL GREEN STERILE (TOWEL DISPOSABLE) ×2 IMPLANT
TOWEL GREEN STERILE FF (TOWEL DISPOSABLE) ×2 IMPLANT
TRAY FOLEY MTR SLVR 16FR STAT (SET/KITS/TRAYS/PACK) ×2 IMPLANT
TUBE CONNECTING 12X1/4 (SUCTIONS) ×2 IMPLANT
WATER STERILE IRR 1000ML POUR (IV SOLUTION) ×2 IMPLANT

## 2021-02-07 NOTE — Anesthesia Postprocedure Evaluation (Signed)
Anesthesia Post Note  Patient: John Mack  Procedure(s) Performed: Right Burr holes for subdural hematoma (Right )     Patient location during evaluation: PACU Anesthesia Type: General Level of consciousness: awake Pain management: pain level controlled Vital Signs Assessment: post-procedure vital signs reviewed and stable Respiratory status: spontaneous breathing Cardiovascular status: stable Postop Assessment: no apparent nausea or vomiting Anesthetic complications: no   No complications documented.  Last Vitals:  Vitals:   02/07/21 1228  BP: (!) 166/86  Pulse: 66  Resp: 17  Temp: 36.8 C  SpO2: 99%    Last Pain:  Vitals:   02/07/21 1228  TempSrc: Oral                 [name]

## 2021-02-07 NOTE — Anesthesia Procedure Notes (Signed)
Procedure Name: Intubation Date/Time: 02/07/2021 3:35 PM Performed by: Rande Brunt, CRNA Pre-anesthesia Checklist: Patient identified, Emergency Drugs available, Suction available and Patient being monitored Patient Re-evaluated:Patient Re-evaluated prior to induction Oxygen Delivery Method: Circle System Utilized Preoxygenation: Pre-oxygenation with 100% oxygen Induction Type: IV induction Ventilation: Mask ventilation without difficulty and Oral airway inserted - appropriate to patient size Laryngoscope Size: Glidescope and 4 Grade View: Grade III Tube type: Oral Tube size: 7.5 mm Number of attempts: 1 Airway Equipment and Method: Stylet and Oral airway Placement Confirmation: ETT inserted through vocal cords under direct vision,  positive ETCO2 and breath sounds checked- equal and bilateral Secured at: 21 cm Tube secured with: Tape Dental Injury: Teeth and Oropharynx as per pre-operative assessment  Comments: DL x 2 with grade 3 view with MAC 3 and Miller 3, anterior. Grade 2b view with glidescope. Gas turned on and masked with oral airway while waiting for glidescope

## 2021-02-07 NOTE — Anesthesia Procedure Notes (Signed)
Arterial Line Insertion Start/End2/28/2022 3:05 PM, 02/07/2021 3:10 PM Performed by: Rande Brunt, CRNA, CRNA  Patient location: Pre-op. Preanesthetic checklist: patient identified, IV checked, site marked, risks and benefits discussed, surgical consent, monitors and equipment checked, pre-op evaluation, timeout performed and anesthesia consent Lidocaine 1% used for infiltration Right, radial was placed Catheter size: 20 G Hand hygiene performed  and maximum sterile barriers used  Allen's test indicative of satisfactory collateral circulation Attempts: 1 Procedure performed without using ultrasound guided technique. Following insertion, dressing applied and Biopatch. Post procedure assessment: normal and unchanged  Patient tolerated the procedure well with no immediate complications.

## 2021-02-07 NOTE — Transfer of Care (Signed)
Immediate Anesthesia Transfer of Care Note  Patient: John Mack  Procedure(s) Performed: Right Burr holes for subdural hematoma (Right )  Patient Location: PACU  Anesthesia Type:General  Level of Consciousness: awake and alert   Airway & Oxygen Therapy: Patient Spontanous Breathing and Patient connected to face mask oxygen  Post-op Assessment: Report given to RN, Post -op Vital signs reviewed and stable and Patient moving all extremities X 4  Post vital signs: Reviewed and stable  Last Vitals:  Vitals Value Taken Time  BP 144/89 02/07/21 1651  Temp    Pulse 76 02/07/21 1653  Resp 16 02/07/21 1653  SpO2 98 % 02/07/21 1653  Vitals shown include unvalidated device data.  Last Pain:  Vitals:   02/07/21 1228  TempSrc: Oral         Complications: No complications documented.

## 2021-02-07 NOTE — H&P (Signed)
BP (!) 166/86   Pulse 66   Temp 98.2 F (36.8 C) (Oral)   Resp 17   Ht 5\' 10"  (1.778 m)   Wt 93 kg   SpO2 99%   BMI 29.41 kg/m  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 He has had persistent headaches which did improve. He fell in December of 21, and an initial head ct done then does not show this large subdural mass.  He comes in today for subdural hematoma evacuation.  No Known Allergies Past Medical History:  Diagnosis Date  . Allergy   . Anxiety   . Arthritis   . Asthma   . Cancer ([place])    skin  . CHF (congestive heart failure) (HCC)    chornic systolic CHF  . DCM (dilated cardiomyopathy) ([place]) 04/18/2017   ED 45% by echo at [facility]  . Depression   . Diverticulosis   . Dysrhythmia   . Eosinophilic esophagitis 2/59/5638   EGD 04/2014 17 Eos/hpf esophageal bxs  . Eye abnormalities    calcium deposits behind eyes  . Frequent PVCs   . GERD (gastroesophageal reflux disease)   . Hx of colonic polyps 12/18/2011  . Hyperlipidemia   . Hypertension   . Hypothyroidism   . SDH (subdural hematoma) ([place]) 11/30/2020  . 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 Thyroid disease    hypothyroid   Past Surgical History:  Procedure Laterality Date  . APPENDECTOMY    . COLONOSCOPY  10/02/2006, 12/18/2011  . ESOPHAGOSCOPY  12/18/2011   with Maloney dilation  . KNEE ARTHROSCOPY     both knees  . SHOULDER ARTHROSCOPY     left shoulder, both knees  . SHOULDER SURGERY     right surgery  . TONSILLECTOMY    . UPPER GASTROINTESTINAL ENDOSCOPY  02/04/2009   with Venia Minks dilation   Prior to Admission medications   Medication Sig Start Date End Date Taking? Authorizing Provider  ALPRAZolam Duanne Moron) 0.5 MG tablet Take 1 mg by mouth at bedtime as needed for sleep.   Yes [provider]  amiodarone (PACERONE) 200 MG tablet Take 200 mg by mouth daily.   Yes [provider]  amLODipine (NORVASC) 2.5 MG tablet Take 2.5 mg by mouth daily.   Yes [provider]  hydrochlorothiazide (HYDRODIURIL) 25 MG tablet Take 25 mg by mouth daily.   Yes [provider]  Levothyroxine Sodium 112 MCG CAPS Take 100 mcg by mouth daily before breakfast.   Yes [provider]  LORazepam (ATIVAN) 1 MG tablet Take by mouth. Take twice a day and 1/2 after lunch 11/03/19  Yes [provider]  metoprolol succinate (TOPROL-XL) 50 MG 24 hr tablet Take 50 mg by mouth 2 (two) times daily. Take with or immediately following a meal.   Yes [provider]  Multiple Vitamins-Minerals (MULTIVITAMIN PO) Take 1 tablet by mouth daily.    Yes [provider]  olmesartan (BENICAR) 40 MG tablet Take 40 mg by mouth daily. 11/25/19  Yes [provider]  pantoprazole (PROTONIX) 40 MG tablet Take 1 tablet by mouth 2 (two) times daily. 11/28/19  Yes [provider]  Albuterol Sulfate, sensor, (PROAIR DIGIHALER) 108 (90 Base) MCG/ACT AEPB Inhale 1 puff into the lungs every 6 (six) hours as needed (sob).    [provider]  Budesonide-Formoterol Fumarate (SYMBICORT IN) Inhale into the lungs 2 (two) times daily.    [provider]  EPINEPHrine  0.3 mg/0.3 mL IJ SOAJ injection as needed. 02/22/12   [provider]  nebivolol (BYSTOLIC) 2.5 MG tablet Take 2.5 mg by mouth daily.    [provider]  Polyethyl Glycol-Propyl Glycol (SYSTANE OP) Place 1 drop into both eyes daily.    [provider]  testosterone cypionate (DEPOTESTOTERONE CYPIONATE) 100 MG/ML injection Inject 200 mg into the muscle every 14 (fourteen) days. For IM use only    [provider]  vardenafil (LEVITRA) 20 MG tablet Take 20 mg by mouth daily as needed. 11/03/19   [provider]  VITAMIN B1-B12 IJ Inject 1,000 mg as directed every 30 (thirty) days.    [provider]  vitamin E 1000 UNIT capsule Take 1,000 Units by mouth 3 (three) times daily.    [provider]   Family  History  Problem Relation Age of Onset  . Mesothelioma Paternal Aunt   . Mesothelioma Paternal Uncle   . Colon cancer Neg Hx   . Esophageal cancer Neg Hx   . Stomach cancer Neg Hx   . Rectal cancer Neg Hx    Social History   Socioeconomic History  . Marital status: Married    Spouse name: Not on file  . Number of children: Not on file  . Years of education: Not on file  . Highest education level: Not on file  Occupational History  . Not on file  Tobacco Use  . Smoking status: Never Smoker  . Smokeless tobacco: Never Used  Vaping Use  . Vaping Use: Never used  Substance and Sexual Activity  . Alcohol use: No    Alcohol/week: 0.0 standard drinks  . Drug use: No  . Sexual activity: Not on file  Other Topics Concern  . Not on file  Social History Narrative   Married his wife is an invalid and he provides full-time care   No alcohol no drugs and never smoker   Social Determinants of Radio broadcast assistant Strain: Not on file  Food Insecurity: Not on file  Transportation Needs: Not on file  Physical Activity: Not on file  Stress: Not on file  Social Connections: Not on file  Intimate Partner Violence: Not on file   Physical Exam Constitutional:      General: He is not in acute distress.    Appearance: Normal appearance.  HENT:     Head: Normocephalic and atraumatic.     Right Ear: Tympanic membrane and external ear normal.     Left Ear: Tympanic membrane and external ear normal.     Nose: Nose normal.     Mouth/Throat:     Mouth: Mucous membranes are moist.     Pharynx: Oropharynx is clear.  Eyes:     Extraocular Movements: Extraocular movements intact.     Conjunctiva/sclera: Conjunctivae normal.     Pupils: Pupils are equal, round, and reactive to light.  Cardiovascular:     Rate and Rhythm: Normal rate and regular rhythm.  Pulmonary:     Effort: Pulmonary effort is normal.     Breath sounds: Normal breath sounds.  Abdominal:     General: Abdomen is  flat.  Musculoskeletal:        General: Normal range of motion.     Cervical back: Normal range of motion.  Neurological:     General: No focal deficit present.     Mental Status: He is alert and oriented to person, place, and time.     Cranial Nerves:  No cranial nerve deficit.     Sensory: No sensory deficit.     Motor: No weakness.     Coordination: Coordination abnormal.     Gait: Gait abnormal.     Deep Tendon Reflexes: Reflexes normal.  Psychiatric:        Mood and Affect: Mood normal.        Behavior: Behavior normal.        Thought Content: Thought content normal.        Judgment: Judgment normal.   Admit for operative chronic subdural hematoma evacuation. Risks and benefits, including but not limited to infection, bleeding, need for further surgery, brain damage, weakness, seizures, stroke and other risks were discussed. He understands and wishes to proceed.

## 2021-02-07 NOTE — Op Note (Signed)
`  02/07/2021  6:02 PM  PATIENT:  John Mack  80 y.o. male with right panhemispheric chronic subdural hematoma  PRE-OPERATIVE DIAGNOSIS:  Subdural hematoma  POST-OPERATIVE DIAGNOSIS:  Subdural hematoma  PROCEDURE:  Procedure(s): Right Burr holes for subdural hematoma  SURGEON: Surgeon(s): Ashok Pall, MD  ASSISTANTS:none  ANESTHESIA:   local and general  EBL:  Total I/O In: 1050 [I.V.:1000; IV Piggyback:50] Out: 600 [Urine:550; Blood:50]  BLOOD ADMINISTERED:none  CELL SAVER GIVEN:none  COUNT:correct  DRAINS: Ventriculostomy Drain in the right subdural space   SPECIMEN:  No Specimen  DICTATION: John Mack was taken to the operating room, intubated, and placed under a general anesthetic without difficulty. He was positioned supine on the bed with his head turned towards the left on a donut. His head was shaved, prepped and draped in a sterile manner. I made a coronal incision overlying the parietal boss. I drilled a small burr hole. I opened the dura with monopolar cautery. The subdural blood shot out of the opening. I placed a ventricular catheter without the guidewire into the subdural space. I was able to easily aspirate fluid, dark reddish brown, from the catheter. I tunneled the catheter underneath the scalp, and placed a male connector on the end. John Mack tolerated the procedure very well  PLAN OF CARE: Admit to inpatient   PATIENT DISPOSITION:  PACU - hemodynamically stable.   Delay start of Pharmacological VTE agent (>24hrs) due to surgical blood loss or risk of bleeding:  yes

## 2021-02-08 ENCOUNTER — Encounter (HOSPITAL_COMMUNITY): Payer: Self-pay | Admitting: Neurosurgery

## 2021-02-08 MED ORDER — PANTOPRAZOLE SODIUM 40 MG PO TBEC
40.0000 mg | DELAYED_RELEASE_TABLET | Freq: Every day | ORAL | Status: DC
  Administered 2021-02-08 – 2021-02-15 (×8): 40 mg via ORAL
  Filled 2021-02-08 (×8): qty 1

## 2021-02-08 NOTE — Progress Notes (Signed)
Patient ID: John Mack, male   DOB: 1941/05/20, 80 y.o.   MRN: [id number] BP 132/75   Pulse (!) 58   Temp 98.8 F (37.1 C) (Oral)   Resp 15   Ht 5\' 10"  (1.778 m)   Wt 93 kg   SpO2 99%   BMI 29.41 kg/m  Alert and oriented x 4, speech is clear and fluent Moving all extremities well Drain is working Will check CT in the morning.  Doing well

## 2021-02-09 ENCOUNTER — Inpatient Hospital Stay (HOSPITAL_COMMUNITY): Payer: Medicare Other

## 2021-02-09 MED ORDER — LEVETIRACETAM 500 MG PO TABS
500.0000 mg | ORAL_TABLET | Freq: Two times a day (BID) | ORAL | Status: DC
  Administered 2021-02-09 – 2021-02-16 (×14): 500 mg via ORAL
  Filled 2021-02-09 (×14): qty 1

## 2021-02-09 NOTE — Evaluation (Signed)
Physical Therapy Evaluation Patient Details Name: John Mack MRN: [id number] DOB: Feb 13, 1941 Today's Date: 02/09/2021   History of Present Illness  80 yo male admitted to [facility] on 2/28, s/p R burr holes and drain placement for chronic R SDH. CTH on 3/2 shows decompressed subdural hemorrhage on the right without complicating features; midline shift has diminished to 4 mm. PMH includes anxiety, skin cancer, HF, dilated cardiomyopathy, depression, HLD, HTN, TIAs, arthroscopies L shoulder and B knees.  Clinical Impression   Pt presents with generalized weakness, min difficulty performing mobility tasks, impaired standing balance with history of multiple falls, and decreased activity tolerance vs baseline. Pt to benefit from acute PT to address deficits. Pt ambulated lap around the unit with IV pole for support, overall requiring close guard to min assist for mobility at this time. At baseline, pt is independent and cares for his wife who has a history of severe "brain bleeds" per pt report, per pt she might go to ST-SNF while he is recovering. PT expects pt to continue to progress well, recommending HHPT and increased assist for daily tasks (OT consult pending). PT to progress mobility as tolerated, and will continue to follow acutely.      Follow Up Recommendations Home health PT;Supervision for mobility/OOB (sister-in-law, children, or pt's wife's assist Leanne to help?)    Equipment Recommendations  None recommended by PT    Recommendations for Other Services OT consult     Precautions / Restrictions Precautions Precautions: Fall Precaution Comments: drain from burr holes, no need to clamp Restrictions Weight Bearing Restrictions: No      Mobility  Bed Mobility Overal bed mobility: Needs Assistance Bed Mobility: Supine to Sit     Supine to sit: Min assist;HOB elevated     General bed mobility comments: min assist for trunk elevation, lines/leads management. Increased time and  effort.    Transfers Overall transfer level: Needs assistance Equipment used: Rolling walker (2 wheeled) Transfers: Sit to/from Stand Sit to Stand: Min assist         General transfer comment: min assist to steady, pt reaching for environment to self-steady once standing.  Ambulation/Gait Ambulation/Gait assistance: Min guard Gait Distance (Feet): 450 Feet Assistive device: IV Pole Gait Pattern/deviations: Step-through pattern;Decreased stride length;Trunk flexed Gait velocity: decr   General Gait Details: min guard for safety, verbal cuing for upright posture and IV pole management. Pt declines ambulation attempt without UE support today due to feeling unsteady  Stairs            Wheelchair Mobility    Modified Rankin (Stroke Patients Only)       Balance Overall balance assessment: Needs assistance;History of Falls (reports multiple stumbles/falls since December with initial injury) Sitting-balance support: No upper extremity supported;Feet supported Sitting balance-[name]: Fair     Standing balance support: Single extremity supported;During functional activity Standing balance-[name]: Fair Standing balance comment: able to stand statically without UE support, reaches for environment dynamically                             Pertinent Vitals/Pain Pain Assessment: Faces Faces Pain Scale: Hurts a little bit Pain Location: head Pain Descriptors / Indicators: Headache Pain Intervention(s): Limited activity within patient's tolerance;Monitored during session;Repositioned    Home Living Family/patient expects to be discharged to:: Private residence Living Arrangements: Spouse/significant other Available Help at Discharge: Family;Available 24 hours/day Type of Home: House Home Access: Stairs to enter;Ramped entrance  Entrance Stairs-Number of Steps: 2 (front), 3 (carport) Home Layout: Able to live on main level with bedroom/bathroom Home  Equipment: Other (comment);Grab bars - tub/shower;Grab bars - toilet Additional Comments: hoyer lift for wife (does not use)    Prior Function Level of Independence: Independent         Comments: fully independence, caring for his w/c level wife (manually lifts pt in and out of w/c) - wife may d/c to ST-SNF while he is recovering     Hand Dominance   Dominant Hand: Right    Extremity/Trunk Assessment   Upper Extremity Assessment Upper Extremity Assessment: Defer to OT evaluation (strong grip strength bilaterally, full AROM shoulder flex/ext)    Lower Extremity Assessment Lower Extremity Assessment: Generalized weakness    Cervical / Trunk Assessment Cervical / Trunk Assessment: Normal  Communication   Communication: No difficulties  Cognition Arousal/Alertness: Awake/alert Behavior During Therapy: WFL for tasks assessed/performed Overall Cognitive Status: Within Functional Limits for tasks assessed                                        General Comments General comments (skin integrity, edema, etc.): vss, SBP <160 pre- and post-gait. HR in 70s during gait    Exercises     Assessment/Plan    PT Assessment Patient needs continued PT services  PT Problem List Decreased mobility;Decreased safety awareness;Decreased activity tolerance;Decreased balance;Decreased knowledge of use of DME;Pain;Decreased knowledge of precautions       PT Treatment Interventions DME instruction;Therapeutic activities;Gait training;Therapeutic exercise;Patient/family education;Balance training;Stair training;Functional mobility training;Neuromuscular re-education    PT Goals (Current goals can be found in the Care Plan section)  Acute Rehab PT Goals Patient Stated Goal: go home PT Goal Formulation: With patient Time For Goal Achievement: 02/23/21 Potential to Achieve Goals: Good    Frequency Min 4X/week   Barriers to discharge        Co-evaluation                AM-PAC PT "6 Clicks" Mobility  Outcome Measure Help needed turning from your back to your side while in a flat bed without using bedrails?: A Little Help needed moving from lying on your back to sitting on the side of a flat bed without using bedrails?: A Little Help needed moving to and from a bed to a chair (including a wheelchair)?: A Little Help needed standing up from a chair using your arms (e.g., wheelchair or bedside chair)?: A Little Help needed to walk in hospital room?: A Little Help needed climbing 3-5 steps with a railing? : A Little 6 Click Score: 18    End of Session Equipment Utilized During Treatment: Gait belt Activity Tolerance: Patient tolerated treatment well Patient left: in chair;with call bell/phone within reach;with chair alarm set;with nursing/sitter in room Nurse Communication: Mobility status PT Visit Diagnosis: Other abnormalities of gait and mobility (R26.89);History of falling (Z91.81)    Time: 3491-7915 PT Time Calculation (min) (ACUTE ONLY): 42 min   Charges:   PT Evaluation $PT Eval Low Complexity: 1 Low PT Treatments $Gait Training: 8-22 mins $Therapeutic Activity: 8-22 mins        Stacie Glaze, PT Acute Rehabilitation Services Pager [phone]  Office [phone]  Roxine Caddy E Ruffin Pyo 02/09/2021, 3:58 PM

## 2021-02-10 ENCOUNTER — Inpatient Hospital Stay (HOSPITAL_COMMUNITY): Payer: Medicare Other

## 2021-02-10 ENCOUNTER — Encounter (HOSPITAL_COMMUNITY): Payer: Self-pay | Admitting: Neurosurgery

## 2021-02-10 DIAGNOSIS — I361 Nonrheumatic tricuspid (valve) insufficiency: Secondary | ICD-10-CM | POA: Diagnosis not present

## 2021-02-10 DIAGNOSIS — I351 Nonrheumatic aortic (valve) insufficiency: Secondary | ICD-10-CM

## 2021-02-10 DIAGNOSIS — I493 Ventricular premature depolarization: Secondary | ICD-10-CM

## 2021-02-10 DIAGNOSIS — I2609 Other pulmonary embolism with acute cor pulmonale: Secondary | ICD-10-CM | POA: Diagnosis not present

## 2021-02-10 DIAGNOSIS — I959 Hypotension, unspecified: Secondary | ICD-10-CM

## 2021-02-10 DIAGNOSIS — I2692 Saddle embolus of pulmonary artery without acute cor pulmonale: Secondary | ICD-10-CM

## 2021-02-10 DIAGNOSIS — R9431 Abnormal electrocardiogram [ECG] [EKG]: Secondary | ICD-10-CM

## 2021-02-10 DIAGNOSIS — R55 Syncope and collapse: Secondary | ICD-10-CM

## 2021-02-10 LAB — COMPREHENSIVE METABOLIC PANEL
ALT: 12 U/L (ref 0–44)
AST: 19 U/L (ref 15–41)
Albumin: 2.8 g/dL — ABNORMAL LOW (ref 3.5–5.0)
Alkaline Phosphatase: 36 U/L — ABNORMAL LOW (ref 38–126)
Anion gap: 10 (ref 5–15)
BUN: 14 mg/dL (ref 8–23)
CO2: 23 mmol/L (ref 22–32)
Calcium: 8 mg/dL — ABNORMAL LOW (ref 8.9–10.3)
Chloride: 98 mmol/L (ref 98–111)
Creatinine, Ser: 0.93 mg/dL (ref 0.61–1.24)
GFR, Estimated: >60 mL/min (ref >60)
Glucose, Bld: 119 mg/dL — ABNORMAL HIGH (ref 70–99)
Potassium: 3.5 mmol/L (ref 3.5–5.1)
Sodium: 131 mmol/L — ABNORMAL LOW (ref 135–145)
Total Bilirubin: 0.7 mg/dL (ref 0.3–1.2)
Total Protein: 5.3 g/dL — ABNORMAL LOW (ref 6.5–8.1)

## 2021-02-10 LAB — CBC
HCT: 34.1 % — ABNORMAL LOW (ref 39.0–52.0)
Hemoglobin: 11.2 g/dL — ABNORMAL LOW (ref 13.0–17.0)
MCH: 31 pg (ref 26.0–34.0)
MCHC: 32.8 g/dL (ref 30.0–36.0)
MCV: 94.5 fL (ref 80.0–100.0)
Platelets: 265 10*3/uL (ref 150–400)
RBC: 3.61 MIL/uL — ABNORMAL LOW (ref 4.22–5.81)
RDW: 14.3 % (ref 11.5–15.5)
WBC: 7.4 10*3/uL (ref 4.0–10.5)
nRBC: 0 % (ref 0.0–0.2)

## 2021-02-10 LAB — TROPONIN I (HIGH SENSITIVITY)
Troponin I (High Sensitivity): 2721 ng/L (ref <18)
Troponin I (High Sensitivity): 3387 ng/L (ref <18)

## 2021-02-10 LAB — ECHOCARDIOGRAM COMPLETE
Area-P 1/2: 4.89 cm2
Height: 70 in
S' Lateral: 2.9 cm
Weight: 3280 oz

## 2021-02-10 LAB — APTT: aPTT: 58 seconds — ABNORMAL HIGH (ref 24–36)

## 2021-02-10 LAB — MAGNESIUM: Magnesium: 1.9 mg/dL (ref 1.7–2.4)

## 2021-02-10 LAB — BRAIN NATRIURETIC PEPTIDE: B Natriuretic Peptide: 88.5 pg/mL (ref 0.0–100.0)

## 2021-02-10 MED ORDER — LORAZEPAM 2 MG/ML IJ SOLN
INTRAMUSCULAR | Status: AC
  Filled 2021-02-10: qty 1

## 2021-02-10 MED ORDER — HEPARIN BOLUS VIA INFUSION
2500.0000 [IU] | Freq: Once | INTRAVENOUS | Status: DC
  Filled 2021-02-10: qty 2500

## 2021-02-10 MED ORDER — SENNOSIDES-DOCUSATE SODIUM 8.6-50 MG PO TABS
1.0000 | ORAL_TABLET | Freq: Every day | ORAL | Status: DC
  Administered 2021-02-10: 1 via ORAL
  Filled 2021-02-10: qty 1

## 2021-02-10 MED ORDER — DOCUSATE SODIUM 100 MG PO CAPS
100.0000 mg | ORAL_CAPSULE | Freq: Every day | ORAL | Status: DC | PRN
  Administered 2021-02-10 – 2021-02-11 (×2): 100 mg via ORAL
  Filled 2021-02-10 (×2): qty 1

## 2021-02-10 MED ORDER — HEPARIN (PORCINE) 25000 UT/250ML-% IV SOLN
1300.0000 [IU]/h | INTRAVENOUS | Status: DC
  Administered 2021-02-10: 1500 [IU]/h via INTRAVENOUS
  Administered 2021-02-11: 1300 [IU]/h via INTRAVENOUS
  Filled 2021-02-10 (×3): qty 250

## 2021-02-10 MED ORDER — IOHEXOL 350 MG/ML SOLN
80.0000 mL | Freq: Once | INTRAVENOUS | Status: AC | PRN
  Administered 2021-02-10: 80 mL via INTRAVENOUS

## 2021-02-10 NOTE — Consult Note (Signed)
NAME:  John Mack, MRN:  [id number], DOB:  October 06, 1941, LOS: 3 ADMISSION DATE:  02/07/2021, CONSULTATION DATE:  02/10/2021 REFERRING MD:  Dr. Christella Noa, CHIEF COMPLAINT:  Presyncope   Brief History:  Patient presented for elective surgical correction of chronic subdural hematoma. PCCM consulted afternoon of 3/3 for symptomatic presyncopal episode.Marland Kitchen  History of Present Illness:  John Mack is a 80 year old male with a past medical history significant for hypothyroidism, hypertension, hyperlipidemia, frequent PVCs, dysrhythmia, dilated cardiomyopathy, skin cancer, and prior TIAs who presented fo for elective evacuation of chronic subdural hematoma by neurosurgery.  Patient suffered mechanical fall December 2021 that resulted in right frontal subdural hematoma and subarachnoid hemorrhage.  Patient initially refused transfer to emergency department follow-up with primary care physician day after a fall at which time CT scan revealed SDH and SAH.  Neurosurgery at that time recommended repeat head CT which was completed 2/22 that revealed significant progression of subdural hematoma.  Patient was advised to present to the emergency department at that elective instead to follow-up with neurosurgery in the outpatient setting.  Patient presented and underwent placement of right bur holes for subdural hematoma evacuation with Dr. Christella Noa 2/28.  Initially patient tolerated procedure well with no postoperative complications.  On 3/3 while ambulating with physical therapy patient was seen with symptomatic presyncope.  He was seen minimally responsive, hypoxic, and diaphoretic.  PCCM was called urgently to bedside for possible assistance with airway.  On arrival patient was seen minimally responsive hypoxic with SPO2 of 78, cyanotic, and with snoring respirations.  Ambu bag was placed but no breaths given and patient quickly became more responsive.  Review of telemetry strip at time of episode revealed very  frequent PVCs almost every other beat.  Of note patient sees cardiologist at [facility] for difficult to treat PVCs and he was placed on p.o. amiodarone.  The cardiologist recommended ambulation but due to subdural hematoma the cardiology team there decided to treat medically.  Past Medical History:  Hypothyroidism, hypertension, hyperlipidemia, frequent PVCs, dysrhythmia, dilated cardiomyopathy, skin cancer, and prior TIAs  Significant Hospital Events:  Admitted for elective SDH evacutaition   Consults:  PCCM Cardiology  Procedures:  Right bur holes for evacuation of subdural hematoma 2/28  Significant Diagnostic Tests:  Head CT 3/2 > decompressed subdural hematoma on the right without complicating features, decreased midline shift  Micro Data:  MRSA PCR 2/28 > negative  Antimicrobials:     Interim History / Subjective:  As above  Objective   Blood pressure 122/71, pulse 60, temperature 99.1 F (37.3 C), temperature source Oral, resp. rate 18, height 5\' 10"  (1.778 m), weight 93 kg, SpO2 95 %.        Intake/Output Summary (Last 24 hours) at 02/10/2021 1129 Last data filed at 02/10/2021 0800 Gross per 24 hour  Intake 1978.22 ml  Output 6025 ml  Net -4046.78 ml   Filed Weights   02/07/21 1228  Weight: 93 kg    Examination: General: Very pleasant middle-aged male lying in bed in no acute distress post presyncopal episode.   HEENT: [place]/AT, MM pink/moist, PERRL,  Neuro: Alert and oriented x3, nonfocal CV: s1s2 regular rate and rhythm, no murmur, rubs, or gallops,  PULM: Clear to auscultation bilaterally, no increased work of breathing, oxygen saturations appropriate on supplemental oxygen GI: soft, bowel sounds active in all 4 quadrants, non-tender, non-distended Extremities: warm/dry, no edema  Skin: no rashes or lesion  Resolved Hospital Problem list     Assessment & Plan:  Chronic subdural hematoma -Evacuated with right bur holes per Dr. Sallee Provencal 2/28 P: Primary  management per neurosurgery Neuro protective measures Frequent neurochecks EVD per neurosurgery  Presyncope -Patient seen with decreased mentation, diaphoretic, and hypoxic during ambulation with PT.  -Cardiac telemetry at time of event revealed frequent PVCs but no evidence of bradycardia -Patient reports that episode of presyncope today felt similar to episode that resulted in fall and eventual development of subdural hematoma December 2021 Hypotension -Per physical therapy and bedside nurse patient was not hypotensive during ambulation but later on my assessment patient was seen profoundly hypotensive with SBP of 68 P: Home medications include Lopressor, amiodarone, Norvasc, hydralazine Hold home antihypertensives Fluid bolus with improvement in blood pressure seen Obtain echocardiogram Obtain CMP Continuous telemetry Bedrest  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 -Primary cardiologist at [facility] recommended ablation for frequent PVCs but due to subdural hematoma decision was made to treat medically with amiodarone and beta-blocker due to inability to utilize anticoagulants. P: Cardiology consulted, appreciate assistance Presyncope work-up as above Monitor volume status  Hypothyroidism -Home medications include Synthroid P: Continue home Synthroid  Hyponatremia  -Seen on Bemt 2/28 P: Repeat CMP now   Best practice (evaluated daily)  Diet: Cardiac  Pain/Anxiety/Delirium protocol (if indicated): As needed VAP protocol (if indicated): N/A DVT prophylaxis: Subq heparin  GI prophylaxis: PPI Glucose control: Monitor  Mobility: Bedrest  Disposition:ICU  Goals of Care:  Per primary   Labs   CBC: Recent Labs  Lab 02/07/21 1219  WBC 6.5  HGB 13.1  HCT 38.5*  MCV 91.2  PLT 166    Basic Metabolic Panel: Recent Labs  Lab 02/07/21 1219  NA 126*  K 3.7  CL  91*  CO2 26  GLUCOSE 92  BUN 12  CREATININE 0.88  CALCIUM 9.1   GFR: Estimated Creatinine Clearance: 80.5 mL/min (by C-G formula based on SCr of 0.88 mg/dL). Recent Labs  Lab 02/07/21 1219  WBC 6.5    Liver Function Tests: No results for input(s): AST, ALT, ALKPHOS, BILITOT, PROT, ALBUMIN in the last 168 hours. No results for input(s): LIPASE, AMYLASE in the last 168 hours. No results for input(s): AMMONIA in the last 168 hours.  ABG    Component Value Date/Time   TCO2 23 04/06/2009 1535     Coagulation Profile: No results for input(s): INR, PROTIME in the last 168 hours.  Cardiac Enzymes: No results for input(s): CKTOTAL, CKMB, CKMBINDEX, TROPONINI in the last 168 hours.  HbA1C: Hgb A1c MFr Bld  Date/Time Value Ref Range Status  04/08/2009 03:33 AM  4.6 - 6.1 % Final   5.6 (NOTE) The ADA recommends the following therapeutic goal for glycemic control related to Hgb A1c measurement: Goal of therapy: <6.5 Hgb A1c  Reference: American Diabetes Association: Clinical Practice Recommendations 2010, Diabetes Care, 2010, 33: (Suppl  1).  04/07/2009 08:14 AM  4.6 - 6.1 % Final   5.7 (NOTE) The ADA recommends the following therapeutic goal for glycemic control related to Hgb A1c measurement: Goal of therapy: <6.5 Hgb A1c  Reference: American Diabetes Association: Clinical Practice Recommendations 2010, Diabetes Care, 2010, 33: (Suppl  1).    CBG: No results for input(s): GLUCAP in the last 168 hours.  Review of Systems: Positive in bold   Gen: Denies fever, chills, weight change, fatigue, night sweats HEENT: Denies blurred vision, double vision, hearing loss, tinnitus, sinus congestion, rhinorrhea, sore throat, neck stiffness,  dysphagia PULM: Denies shortness of breath, cough, sputum production, hemoptysis, wheezing CV: Denies chest pain, edema, orthopnea, paroxysmal nocturnal dyspnea, palpitations GI: Denies abdominal pain, nausea, vomiting, diarrhea, hematochezia, melena,  constipation, change in bowel habits GU: Denies dysuria, hematuria, polyuria, oliguria, urethral discharge Endocrine: Denies hot or cold intolerance, polyuria, polyphagia or appetite change Derm: Denies rash, dry skin, scaling or peeling skin change Heme: Denies easy bruising, bleeding, bleeding gums Neuro: Denies headache, numbness, weakness, slurred speech, loss of memory or consciousness  Past Medical History:  He,  has a past medical history of Allergy, Anxiety, Arthritis, Asthma, Cancer ([place]), CHF (congestive heart failure) ([place]), DCM (dilated cardiomyopathy) ([place]) (04/18/2017), Depression, Diverticulosis, Dysrhythmia, Eosinophilic esophagitis (1/66/0630), Eye abnormalities, Frequent PVCs, GERD (gastroesophageal reflux disease), colonic polyps (12/18/2011), Hyperlipidemia, Hypertension, Hypothyroidism, SDH (subdural hematoma) ([place]) (11/30/2020), Stroke ([place]), and Thyroid disease.   Surgical History:   Past Surgical History:  Procedure Laterality Date  . APPENDECTOMY    . BURR HOLE Right 02/07/2021   Procedure: Right Burr holes for subdural hematoma;  Surgeon: Ashok Pall, MD;  Location: [facility];  Service: Neurosurgery;  Laterality: Right;  Right Burr holes for subdural hematoma  . COLONOSCOPY  10/02/2006, 12/18/2011  . ESOPHAGOSCOPY  12/18/2011   with Maloney dilation  . KNEE ARTHROSCOPY     both knees  . SHOULDER ARTHROSCOPY     left shoulder, both knees  . SHOULDER SURGERY     right surgery  . TONSILLECTOMY    . UPPER GASTROINTESTINAL ENDOSCOPY  02/04/2009   with Venia Minks dilation     Social History:   reports that he has never smoked. He has never used smokeless tobacco. He reports that he does not drink alcohol and does not use drugs.   Family History:  His family history includes Mesothelioma in his paternal aunt and paternal uncle. There is no history of Colon cancer, Esophageal cancer, Stomach cancer, or Rectal cancer.   Allergies No Known Allergies   Home Medications  Prior  to Admission medications   Medication Sig Start Date End Date Taking? Authorizing Provider  Albuterol Sulfate, sensor, (PROAIR DIGIHALER) 108 (90 Base) MCG/ACT AEPB Inhale 1 puff into the lungs every 6 (six) hours as needed (shortness of breath).   Yes [provider]  alprazolam Duanne Moron) 2 MG tablet Take 2 mg by mouth at bedtime. 01/21/21  Yes [provider]  amiodarone (PACERONE) 200 MG tablet Take 200 mg by mouth daily.   Yes [provider]  amLODipine (NORVASC) 2.5 MG tablet Take 2.5 mg by mouth daily.   Yes [provider]  cyanocobalamin (,VITAMIN B-12,) 1000 MCG/ML injection Inject 1,000 mcg into the muscle See admin instructions. Inject 1 mL (1084mcg) intramuscularly every 30 days as needed for vitamin B12/energy. 01/31/21  Yes [provider]  EPINEPHrine 0.3 mg/0.3 mL IJ SOAJ injection Inject 0.3 mg into the muscle as needed for anaphylaxis. 02/22/12  Yes [provider]  esomeprazole (NEXIUM) 40 MG capsule Take 40 mg by mouth 2 (two) times daily. 01/15/21  Yes [provider]  hydrochlorothiazide (HYDRODIURIL) 25 MG tablet Take 25 mg by mouth daily.   Yes [provider]  levothyroxine (SYNTHROID) 112 MCG tablet Take 112 mcg by mouth every morning. 01/03/21  Yes [provider]  LORazepam (ATIVAN) 1 MG tablet Take 0.5-1 mg by mouth See admin instructions. Take 1 tablet (1mg ) by mouth twice daily and 1/2 tablet (0.5mg ) by mouth after lunch. 11/03/19  Yes [provider]  metoprolol succinate (TOPROL-XL) 25 MG  24 hr tablet Take 25 mg by mouth 2 (two) times daily. 01/09/21  Yes [provider]  Multiple Vitamins-Minerals (MULTIVITAMIN PO) Take 1 tablet by mouth daily.    Yes [provider]  pantoprazole (PROTONIX) 40 MG tablet Take 1 tablet by mouth 2 (two) times daily. 11/28/19  Yes [provider]  Polyethyl Glycol-Propyl Glycol (SYSTANE OP) Place 1 drop into both eyes daily as  needed (dry eyes).   Yes [provider]  pravastatin (PRAVACHOL) 20 MG tablet Take 20 mg by mouth daily. 12/28/20  Yes [provider]  SYMBICORT 160-4.5 MCG/ACT inhaler Inhale 2 puffs into the lungs 2 (two) times daily as needed for shortness of breath. 01/16/21  Yes [provider]  telmisartan (MICARDIS) 80 MG tablet Take 80 mg by mouth daily. 12/07/20  Yes [provider]  testosterone cypionate (DEPOTESTOTERONE CYPIONATE) 100 MG/ML injection Inject 200 mg into the muscle every 14 (fourteen) days. For IM use only   Yes [provider]  vardenafil (LEVITRA) 20 MG tablet Take 20 mg by mouth daily as needed for erectile dysfunction. 11/03/19  Yes [provider]     Signature    Johnsie Cancel, NP-C [facility] Personal contact information can be found on Amion  If no response please page: Adult pulmonary and critical care medicine pager on Amion unitl 7pm After 7pm please call [phone] 02/10/2021, 12:29 PM

## 2021-02-10 NOTE — Progress Notes (Signed)
ANTICOAGULATION CONSULT NOTE - Initial Consult  Pharmacy Consult for Heparin Indication: Saddle PE  No Known Allergies  Patient Measurements: Height: 5\' 10"  (177.8 cm) Weight: 93 kg (205 lb) IBW/kg (Calculated) : 73 Heparin Dosing Weight:  92 kg  Vital Signs: Temp: 98 F (36.7 C) (03/03 1600) Temp Source: Axillary (03/03 1600) BP: 157/97 (03/03 1730) Pulse Rate: 82 (03/03 1730)  Labs: Recent Labs    02/10/21 1156  HGB 11.2*  HCT 34.1*  PLT 265  CREATININE 0.93    Estimated Creatinine Clearance: 76.2 mL/min (by C-G formula based on SCr of 0.93 mg/dL).   Medical History: Past Medical History:  Diagnosis Date  . Allergy   . Anxiety   . Arthritis   . Asthma   . Cancer ([place])    skin  . CHF (congestive heart failure) ([facility])    chornic systolic CHF  . DCM (dilated cardiomyopathy) ([place]) 04/18/2017   ED 45% by echo at [facility]  . Depression   . Diverticulosis   . Dysrhythmia   . Eosinophilic esophagitis 04/04/9562   EGD 04/2014 17 Eos/hpf esophageal bxs  . Eye abnormalities    calcium deposits behind eyes  . Frequent PVCs   . GERD (gastroesophageal reflux disease)   . Hx of colonic polyps 12/18/2011  . Hyperlipidemia   . Hypertension   . Hypothyroidism   . SDH (subdural hematoma) ([place]) 11/30/2020  . Stroke [facility])    2 TIAs  . Thyroid disease    hypothyroid    Assessment: CC/HPI: persistent HA from chronic large SD hematoma  PMH: skin cancer, CHF, CM, GERD, HLD, HTN, hypothyroid, SDH, TIAs   Anticoagulation: Anticoag: s/p 2/28 evacuation of chronic large SD hematoma. Start IV heparin for BL PE + saddle embolus. RV/LV 1.91. Hgb 14.1>11.2.- per critical care note Dr. Christella Noa in agreement for thrombolytic if needed for treatment for PE if present. Pt has been asymptomatic currently on RA.  Goal of Therapy:  Heparin level 0.3-0.5 units/ml Monitor platelets by anticoagulation protocol: Yes   Plan:  Received SQ heparin 1331--d/c Start IV heparin 1/2 bolus at 2500  units Heparin infusion at 1500 units/hr Check heparin level 6-8 hrs after heparin starts Daily HL and CBC   Crystal S. Alford Highland, PharmD, BCPS Clinical Staff Pharmacist [email or web address] Alford Highland, [employer] 02/10/2021,5:55 PM

## 2021-02-10 NOTE — Plan of Care (Signed)
  Problem: Education: Goal: Knowledge of the prescribed therapeutic regimen will improve Outcome: Progressing   Problem: Clinical Measurements: Goal: Ability to maintain intracranial pressure will improve Outcome: Progressing   Problem: Clinical Measurements: Goal: Ability to maintain clinical measurements within normal limits will improve Outcome: Progressing Goal: Cardiovascular complication will be avoided Outcome: Progressing   Problem: Activity: Goal: Risk for activity intolerance will decrease Outcome: Progressing

## 2021-02-10 NOTE — Progress Notes (Signed)
Dictation on: 02/10/2021  2:59 AM by: Ashok Pall [[id number]]

## 2021-02-10 NOTE — Progress Notes (Signed)
  Echocardiogram 2D Echocardiogram has been performed.  John Mack 02/10/2021, 2:54 PM

## 2021-02-10 NOTE — Evaluation (Signed)
Occupational Therapy Evaluation Patient Details Name: John Mack MRN: [id number] DOB: 1941-05-31 Today's Date: 02/10/2021    History of Present Illness 80 yo male admitted to [facility] on 2/28, s/p R burr holes and drain placement for chronic R SDH. CTH on 3/2 shows decompressed subdural hemorrhage on the right without complicating features; midline shift has diminished to 4 mm. PMH includes anxiety, skin cancer, HF, dilated cardiomyopathy, depression, HLD, HTN, TIAs, arthroscopies L shoulder and B knees.   Clinical Impression   Prior to mobility and at bed level, pt was conversational and very motivated. Upon arrival, pt walking in hallway with PT. Pt reporting dizziness and presenting with low SpO2 (80-70s on RA), pale, and diaphoretic. Taking pt BP (stable) and having pt sit in chair. RN present. Pt reporting feeling better, vitals stable on 3L O2, conversational again, and attempting mobility back to room with Min A. Upon return to room, pt reporting feeling faint. Return to bed and supine; pt with seizure like episode, decerebrate posturing, and drops in SpO2. Repositioned in bed and deferred to nursing. Pending pt progress and further assessment, recommend dc to home with HHOT      Follow Up Recommendations  Home health OT;Other (comment) (Pending progress after medical complication)    Equipment Recommendations  3 in 1 bedside commode    Recommendations for Other Services PT consult     Precautions / Restrictions Precautions Precautions: Fall Precaution Comments: drain from burr holes, no need to clamp      Mobility Bed Mobility Overal bed mobility: Needs Assistance Bed Mobility: Sit to Supine       Sit to supine: Total assist   General bed mobility comments: Total back to bed due to dizziness, diaphoretic, decreased SpO2, and feeling like fainting.    Transfers Overall transfer level: Needs assistance Equipment used: Rolling walker (2 wheeled) Transfers: Sit to/from  Stand Sit to Stand: Min assist         General transfer comment: min assist to steady, pt reaching for environment to self-steady once standing.    Balance Overall balance assessment: Needs assistance;History of Falls (reports multiple stumbles/falls since December with initial injury) Sitting-balance support: No upper extremity supported;Feet supported Sitting balance-[name]: Fair     Standing balance support: Single extremity supported;During functional activity Standing balance-[name]: Fair Standing balance comment: able to stand statically without UE support, reaches for environment dynamically                           ADL either performed or assessed with clinical judgement   ADL Overall ADL's : Needs assistance/impaired Eating/Feeding: Supervision/ safety;Set up;Sitting   Grooming: Minimal assistance;Standing   Upper Body Bathing: Supervision/ safety;Set up;Sitting   Lower Body Bathing: Minimal assistance;Sit to/from stand   Upper Body Dressing : Supervision/safety;Set up;Sitting   Lower Body Dressing: Moderate assistance;Sit to/from stand                 General ADL Comments: Evaluation limited due to drop is SpO2, diaphoretic, and then seizure. Upon arrival, pt walking with PT in hallway, SpO2 dropping and pt diaphoretic. Taking BP (stable) and having pt sit. RN arriving. Pt reporting dizziness decreased. Attempting mobility back to room and pt completing with PT. Once back to room, pt reporting feeling as he is going ot pass out. Return to supine and to going ot decerebrate posturing and snorting noises. Repositioned in bed and defer to RN care.  Vision         Perception     Praxis      Pertinent Vitals/Pain Pain Assessment: Faces Faces Pain Scale: Hurts a little bit Pain Location: head Pain Descriptors / Indicators: Headache Pain Intervention(s): Monitored during session;Limited activity within patient's  tolerance;Repositioned     Hand Dominance Right   Extremity/Trunk Assessment Upper Extremity Assessment Upper Extremity Assessment: Overall WFL for tasks assessed   Lower Extremity Assessment Lower Extremity Assessment: Defer to PT evaluation   Cervical / Trunk Assessment Cervical / Trunk Assessment: Normal   Communication Communication Communication: No difficulties   Cognition Arousal/Alertness: Awake/alert Behavior During Therapy: WFL for tasks assessed/performed Overall Cognitive Status: Within Functional Limits for tasks assessed                                     General Comments  SpO2 90 on RA, HR 70s, and BP stable in hallway with PT. Upon OT arrival, SpO2 dropping to 88 and then 75% on RA; placed on 3L and SpO2 stablizing. HR maintaining in 70s during episode. BP stable. Upon return to room, returning pt to bed and then decerebrate posturing.    Exercises     Shoulder Instructions      Home Living Family/patient expects to be discharged to:: Private residence Living Arrangements: Spouse/significant other Available Help at Discharge: Family;Available 24 hours/day Type of Home: House Home Access: Stairs to enter;Ramped entrance Entrance Stairs-Number of Steps: 2 (front), 3 (carport)   Home Layout: Able to live on main level with bedroom/bathroom     Bathroom Shower/Tub: Walk-in shower (lip to step over)   Biochemist, clinical: Handicapped height     Home Equipment: Other (comment);Grab bars - tub/shower;Grab bars - toilet   Additional Comments: hoyer lift for wife (does not use)      Prior Functioning/Environment Level of Independence: Independent        Comments: fully independence, caring for his w/c level wife (manually lifts pt in and out of w/c) - wife may d/c to ST-SNF while he is recovering        OT Problem List: Decreased strength;Decreased range of motion;Impaired balance (sitting and/or standing);Decreased activity  tolerance;Decreased knowledge of use of DME or AE;Decreased knowledge of precautions;Cardiopulmonary status limiting activity      OT Treatment/Interventions: Self-care/ADL training;Therapeutic exercise;DME and/or AE instruction;Energy conservation;Therapeutic activities;Patient/family education    OT Goals(Current goals can be found in the care plan section) Acute Rehab OT Goals Patient Stated Goal: go home OT Goal Formulation: With patient Time For Goal Achievement: 02/24/21 Potential to Achieve Goals: Good  OT Frequency: Min 2X/week   Barriers to D/C:            Co-evaluation              AM-PAC OT "6 Clicks" Daily Activity     Outcome Measure Help from another person eating meals?: None Help from another person taking care of personal grooming?: A Little Help from another person toileting, which includes using toliet, bedpan, or urinal?: A Little Help from another person bathing (including washing, rinsing, drying)?: A Little Help from another person to put on and taking off regular upper body clothing?: A Little Help from another person to put on and taking off regular lower body clothing?: A Little 6 Click Score: 19   End of Session Equipment Utilized During Treatment: Oxygen (3L) Nurse Communication: Other (comment) (vitals and dizziness)  Activity Tolerance: Patient tolerated treatment well;Other (comment) (Limited by medical instability) Patient left: in bed;with nursing/sitter in room  OT Visit Diagnosis: Unsteadiness on feet (R26.81);Other abnormalities of gait and mobility (R26.89);Muscle weakness (generalized) (M62.81)                Time: 3500-9381 OT Time Calculation (min): 11 min Charges:  OT General Charges $OT Visit: 1 Visit OT Evaluation $OT Eval Moderate Complexity: [place], OTR/L Acute Rehab Pager: [phone] Office: [place] 02/10/2021, 12:58 PM

## 2021-02-10 NOTE — Progress Notes (Signed)
PCCM Progress note  CTA chest obtained and revealed acute submassive PE with RV/LV of 1.91.  Given recent neurosurgical intervention for subdural hematoma patient is a poor a candidate for systemic thrombolytics and for catheter directed thrombolytics.  Therefore interventional radiology consulted for possible mechanical intervention.  Spoke to Dr. Ruthann Cancer with interventional radiology who did no recommend any mechanical nterventions at this time as risk outweighed the benefit.  He did state that if patient decompensated overnight he could be a candidate for suction embolectomy.  At this time will anticoagulate with systemic heparin with no bolus and monitor neuro status closely.  Patient and family updated regarding plan and agreeable.  John Cancel, NP-C [facility] Personal contact information can be found on Amion  If no response please page: Adult pulmonary and critical care medicine pager on Amion unitl 7pm After 7pm please call [phone] 02/10/2021, 6:14 PM

## 2021-02-10 NOTE — Progress Notes (Signed)
[facility] Progress Note Patient Name: John Mack DOB: [birth year] MRN: [id number]   Date of Service  02/10/2021  HPI/Events of Note  Troponin = 2721 --> 3387. Related to PE with R heart strain? Patient already followed by cardiology. Patient already on Heparin IV infusion.   eICU Interventions  Continue present management. Further input per cardiology.      Intervention Category Major Interventions: Other:  Lysle Dingwall 02/10/2021, 10:12 PM

## 2021-02-10 NOTE — Progress Notes (Signed)
Called to hallway by PT/OT working with patient. Patient with VSS prior to episode, on my arrival patient with oxygen saturation in 70s. Placed patient in chair with 4L nasal cannula. After deep breathing patient saturation back to 95%. HR and blood pressure remained stable during entire episode. Once stable, PT, OT and I walked with patient back to room <50 ft. Patient stated he was "going to pass out again" and was quickly moved to the bed. Patient became very tachypneic, sweaty, shallow breathing. Oxygen dropped to 70s again, jerking, agonal type respirations, pale. Patient able to nod during entire episode. Ambu bag applied for blow by oxygen and saturation returned to 95%.  Shortly after, patient became hypotensive requiring 1L NS bolus. Labs ordered and STAT echo/cards consult d/t heart irregularities during episode.

## 2021-02-10 NOTE — Consult Note (Signed)
Cardiology Consultation:   Patient ID: John Mack MRN: [id number]; DOB: October 05, 1941  Admit date: 02/07/2021 Date of Consult: 02/10/2021  PCP:  John Solian, MD   [facility]  Cardiologist:  Previously seen by Dr. Radford Mack but now follows with Dr. Janeece Mack at [facility] Electrophysiologist:  None    Patient Profile:   John Mack is a 80 y.o. 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  History of Present Illness:   [name] is a 80 year old male with the above history who was previously seen by Dr. Radford Mack but now follows with Dr. Janeece [facility] at [facility]. Patient has a history of chronic heart failure with mildly reduced EF as low as 45%. She also has a history of palpitations with Holter Monitor in 08/2020 revealing 31% PVC burden. She was started on Metoprolol and Amiodarone. Also had discussion of possible ablation but patient wanted to try Amiodarone first. Patient had a fall at the end of December 11/2020 as he was standing after reaching down to pick up his dog's leash. Head CT showed frontal subdural hematoma and sub-arachnoid hemorrhage. He did not require any intervention and plan was for repeat CT to assess for resolution. Patient was last seen by Dr. Janeece Mack on 01/19/2021. Patient had noted that heart rate would occasionally drop into the 40's in January but this had seemed to resolve on its own. He denied any lightheadedness, dizziness, or near syncope since diagnosis of SDH. Dr. Janeece Mack ordered repeat Echo and Monitor for further evaluation. Echo showed LVEF of 50% with mild LVH. No significant valvular disease. Cannot  see monitor results in Care Everywhere. 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 Patient was advised to go to the ED but patient declined due to lack of symptoms and preferred urgent referral to Neurosurgery. He was seen by Dr. Leonie Mack and was admitted for hematoma evacuation.  Patient presented and underwent placement of right burr holes with [facility] on 02/07/2021. Patient initially tolerate procedure well with no postoperative complications. However, while ambulating today with physical therapy was had episode of presyncope. He was reportedly minimally responsive, hypoxic, and diaphoretic. PCCM was urgently called to bedside for possible assistance of airway. On their arrival, patient was still minimally responsive and O2 sat was 78%. BP 68/46. Patient was cyanotic with snoring respirations. Ambu bag was placed but no breaths given and patient quickly became more responsive. Home BP medications held and fluid bolus given. Cardiology consulted for assistance.   Echo showed LVEF of 50-55% with normal wall motion and mild LVH. RV is severely enlarged with moderately decreased systolic function. RV apex appears hyperkinetic and PASP moderately elevated at 53.9 mmHg. Findings concerning for acute PE. Chest CTA confirmed PE and showed bilateral PE including a saddle embolus at right pulmonary hilum extending into right upper lobe and right middle lobe as well as multiple small emboli within right lower lobe, left lower lobe, and left upper lobe  pulmonary arteries. There was also evidence of right heart strain.  At the time of this evaluation, patient resting comfortably in no acute distress. We talked about the event this morning. He states he was up walking with PT  and John of a sudden became very weak, diaphoretic, starting breathing fast, and felt like he was going to pass out. He denies any syncope. No chest pain. He does have some palpitations with his PVCs but no recent lightheadedness, dizziness. It sounds like he may have had a vasovagal episodes in 11/2020 when he fell. He states he stood up quickly after picking up dog leash and fell backwards. He states he almost passed out at that time as well. No other episodes of near syncope.   Past Medical History:  Diagnosis Date  . Allergy   . Anxiety   . Arthritis   . Asthma   . Cancer ([facility])    skin  . CHF (congestive heart failure) (HCC)    chornic systolic CHF  . DCM (dilated cardiomyopathy) ([place]) 04/18/2017   ED 45% by echo at [facility]  . Depression   . Diverticulosis   . Dysrhythmia   . Eosinophilic esophagitis 5/46/2703   EGD 04/2014 17 Eos/hpf esophageal bxs  . Eye abnormalities    calcium deposits behind eyes  . Frequent PVCs   . GERD (gastroesophageal reflux disease)   . Hx of colonic polyps 12/18/2011  . Hyperlipidemia   . Hypertension   . Hypothyroidism   . SDH (subdural hematoma) ([place]) 11/30/2020  . Stroke [facility])    2 TIAs  . Thyroid disease    hypothyroid    Past Surgical History:  Procedure Laterality Date  . APPENDECTOMY    . BURR HOLE Right 02/07/2021   Procedure: Right Burr holes for subdural hematoma;  Surgeon: Ashok Pall, MD;  Location: [facility];  Service: Neurosurgery;  Laterality: Right;  Right Burr holes for subdural hematoma  . COLONOSCOPY  10/02/2006, 12/18/2011  . ESOPHAGOSCOPY  12/18/2011   with Maloney dilation  . KNEE ARTHROSCOPY     both knees  . SHOULDER ARTHROSCOPY     left shoulder, both knees  . SHOULDER SURGERY     right surgery  . TONSILLECTOMY    . UPPER GASTROINTESTINAL ENDOSCOPY  02/04/2009   with Venia Minks dilation     Home Medications:  Prior to Admission medications   Medication Sig Start Date End Date Taking? Authorizing Provider  Albuterol Sulfate,  sensor, (PROAIR DIGIHALER) 108 (90 Base) MCG/ACT AEPB Inhale 1 puff into the lungs every 6 (six) hours as needed (shortness of breath).   Yes [provider]  alprazolam Duanne Moron) 2 MG tablet Take 2 mg by mouth at bedtime. 01/21/21  Yes [provider]  amiodarone (PACERONE) 200 MG tablet Take 200 mg by mouth daily.   Yes [provider]  amLODipine (NORVASC) 2.5 MG tablet Take 2.5 mg by mouth daily.   Yes [provider]  cyanocobalamin (,VITAMIN B-12,) 1000 MCG/ML injection Inject 1,000 mcg into the muscle See admin instructions. Inject 1 mL (1086mcg) intramuscularly every 30 days as needed for vitamin B12/energy. 01/31/21  Yes [provider]  EPINEPHrine 0.3 mg/0.3 mL IJ SOAJ injection Inject 0.3 mg into the muscle as needed for anaphylaxis. 02/22/12  Yes [provider]  esomeprazole (NEXIUM) 40 MG capsule Take 40 mg by mouth 2 (two) times daily. 01/15/21  Yes [provider]  hydrochlorothiazide (HYDRODIURIL) 25 MG tablet Take 25 mg by mouth daily.   Yes  [provider]  levothyroxine (SYNTHROID) 112 MCG tablet Take 112 mcg by mouth every morning. 01/03/21  Yes [provider]  LORazepam (ATIVAN) 1 MG tablet Take 0.5-1 mg by mouth See admin instructions. Take 1 tablet (1mg ) by mouth twice daily and 1/2 tablet (0.5mg ) by mouth after lunch. 11/03/19  Yes [provider]  metoprolol succinate (TOPROL-XL) 25 MG 24 hr tablet Take 25 mg by mouth 2 (two) times daily. 01/09/21  Yes [provider]  Multiple Vitamins-Minerals (MULTIVITAMIN PO) Take 1 tablet by mouth daily.    Yes [provider]  pantoprazole (PROTONIX) 40 MG tablet Take 1 tablet by mouth 2 (two) times daily. 11/28/19  Yes [provider]  Polyethyl Glycol-Propyl Glycol (SYSTANE OP) Place 1 drop into both eyes daily as needed (dry eyes).   Yes [provider]  pravastatin (PRAVACHOL) 20 MG tablet Take 20 mg by mouth daily.  12/28/20  Yes [provider]  SYMBICORT 160-4.5 MCG/ACT inhaler Inhale 2 puffs into the lungs 2 (two) times daily as needed for shortness of breath. 01/16/21  Yes [provider]  telmisartan (MICARDIS) 80 MG tablet Take 80 mg by mouth daily. 12/07/20  Yes [provider]  testosterone cypionate (DEPOTESTOTERONE CYPIONATE) 100 MG/ML injection Inject 200 mg into the muscle every 14 (fourteen) days. For IM use only   Yes [provider]  vardenafil (LEVITRA) 20 MG tablet Take 20 mg by mouth daily as needed for erectile dysfunction. 11/03/19  Yes [provider]    Inpatient Medications: Scheduled Meds: . amiodarone  200 mg Oral Daily  . Chlorhexidine Gluconate Cloth  6 each Topical Daily  . levETIRAcetam  500 mg Oral BID  . levothyroxine  112 mcg Oral QAC breakfast  . LORazepam      . LORazepam  0.5 mg Oral QPC lunch  . LORazepam  1 mg Oral BID  . mometasone-formoterol  2 puff Inhalation BID  . multivitamin with minerals   Oral Daily  . pantoprazole  40 mg Oral QHS  . senna-docusate  1 tablet Oral QHS   Continuous Infusions: . 0.9 % NaCl with KCl 20 mEq / L 50 mL/hr at 02/10/21 1747  .  ceFAZolin (ANCEF) IV 1 g (02/10/21 1340)   PRN Meds: acetaminophen **OR** acetaminophen, albuterol, ALPRAZolam, docusate sodium, EPINEPHrine, HYDROcodone-acetaminophen, labetalol, ondansetron **OR** ondansetron (ZOFRAN) IV, polyvinyl alcohol, promethazine  Allergies:   No Known Allergies  Social History:   Social History   Socioeconomic History  . Marital status: Married    Spouse name: Not on file  . Number of children: Not on file  . Years of education: Not on file  . Highest education level: Not on file  Occupational History  . Not on file  Tobacco Use  . Smoking status: Never Smoker  . Smokeless tobacco: Never Used  Vaping Use  . Vaping Use: Never used  Substance and Sexual Activity  . Alcohol use: No    Alcohol/week: 0.0 standard drinks  .  Drug use: No  . Sexual activity: Not on file  Other Topics Concern  . Not on file  Social History Narrative   Married his wife is an invalid and he provides full-time care   No alcohol no drugs and never smoker   Social Determinants of Radio broadcast assistant Strain: Not on file  Food Insecurity: Not on file  Transportation Needs: Not on file  Physical Activity: Not on file  Stress: Not on file  Social Connections:  Not on file  Intimate Partner Violence: Not on file    Family History:    Family History  Problem Relation Age of Onset  . Mesothelioma Paternal Aunt   . Mesothelioma Paternal Uncle   . Colon cancer Neg Hx   . Esophageal cancer Neg Hx   . Stomach cancer Neg Hx   . Rectal cancer Neg Hx      ROS:  Please see the history of present illness.  John other ROS reviewed and negative.     Physical Exam/Data:   Vitals:   02/10/21 1600 02/10/21 1630 02/10/21 1700 02/10/21 1730  BP: 129/82 (!) 132/92 (!) 134/100 (!) 157/97  Pulse: 80 81 83 82  Resp: 15 17 (!) 23 17  Temp: 98 F (36.7 C)     TempSrc: Axillary     SpO2: 95% 95% 97% 98%  Weight:      Height:        Intake/Output Summary (Last 24 hours) at 02/10/2021 1757 Last data filed at 02/10/2021 1600 Gross per 24 hour  Intake 2337.88 ml  Output 5050 ml  Net -2712.12 ml   Last 3 Weights 02/07/2021 12/02/2019 06/21/2017  Weight (lbs) 205 lb 202 lb 206 lb  Weight (kg) 92.987 kg 91.627 kg 93.441 kg     Body mass index is 29.41 kg/m.  General: 80 y.o. male resting comfortably in no acute distress. HEENT: Normocephalic and atraumatic. Sclera clear. Drain from right burr holes present. Neck: Supple. No carotid bruits. No JVD. Heart: RRR. Distinct S1 and S2. Soft systolic murmur at left upper sternal border. No gallops or rubs. Radial  pulses 2+ and equal bilaterally. Lungs: No increased work of breathing. Clear to ausculation bilaterally. No wheezes, rhonchi, or rales.  Abdomen: Soft, non-distended, and  non-tender to palpation. Bowel sounds present. MSK: Normal strength and tone for age. Extremities: Trace lower extremity edema.    Skin: Warm and dry. Neuro: Alert and oriented x3. No focal deficits. Psych: Normal affect. Responds appropriately.   EKG:  The EKG was personally reviewed and demonstrates:  Normal sinus rhythm, rate 79 bpm, with incomplete RBBB, LAFB, mild ST depression/biphasic T waves in leads I and aVL as well as mild ST elevation in lead V3 not consistent with STEMI.  Telemetry:  Telemetry was personally reviewed and demonstrates: Sinus rhythm with rates in the 50's to 80's. PVC sometimes in bigeminy pattern. Short runs of NSVT (one possible 17 beat run of NSVT but looks like lead fell off in the middle of this so it may be more artifact).  Relevant CV Studies:  Echocardiogram 02/10/2021: Impressions: 1. There is severe RV enlargement with moderately depressed RV systolic  function. The RV apex appears hyperkinetic compared to the base. Findings  appear to be new compared to prior echo report at [facility] (images not  available). This is highly concerning  for an acute pulmonary embolism.  2. There is moderately elevated pulmonary artery systolic pressure. The  estimated right ventricular systolic pressure is 52.7 mmHg.  3. Left ventricular ejection fraction, by estimation, is 50 to 55%. The  left ventricle has low normal function. The left ventricle has no regional  wall motion abnormalities.  4. There is mild concentric left ventricular hypertrophy. Left  ventricular diastolic parameters are consistent with Grade I diastolic  dysfunction (impaired relaxation).  5. There is the interventricular septum is flattened in systole,  consistent with right ventricular pressure overload.  6. Right atrial size was severely dilated.  7. The mitral  valve is normal in structure. Trivial mitral valve  regurgitation.  8. There is mild-to-moderate highly eccentric tricuspid  regurgitation.  9. The aortic valve is tricuspid. There is mild calcification of the  aortic valve. There is mild thickening of the aortic valve. Aortic valve  regurgitation is mild.  10. Aortic dilatation noted. There is mild dilatation of the aortic root,  measuring 41 mm.  11. The inferior vena cava is dilated in size with <50% respiratory  variability, suggesting right atrial pressure of 15 mmHg.   Laboratory Data:  High Sensitivity Troponin:  No results for input(s): TROPONINIHS in the last 720 hours.   Chemistry Recent Labs  Lab 02/07/21 1219 02/10/21 1156  NA 126* 131*  K 3.7 3.5  CL 91* 98  CO2 26 23  GLUCOSE 92 119*  BUN 12 14  CREATININE 0.88 0.93  CALCIUM 9.1 8.0*  GFRNONAA >60 >60  ANIONGAP 9 10    Recent Labs  Lab 02/10/21 1156  PROT 5.3*  ALBUMIN 2.8*  AST 19  ALT 12  ALKPHOS 36*  BILITOT 0.7   Hematology Recent Labs  Lab 02/07/21 1219 02/10/21 1156  WBC 6.5 7.4  RBC 4.22 3.61*  HGB 13.1 11.2*  HCT 38.5* 34.1*  MCV 91.2 94.5  MCH 31.0 31.0  MCHC 34.0 32.8  RDW 13.7 14.3  PLT 315 265   BNPNo results for input(s): BNP, PROBNP in the last 168 hours.  DDimer No results for input(s): DDIMER in the last 168 hours.   Radiology/Studies:  CT HEAD WO CONTRAST  Result Date: 02/09/2021 CLINICAL DATA:  Chronic subdural hemorrhage EXAM: CT HEAD WITHOUT CONTRAST TECHNIQUE: Contiguous axial images were obtained from the base of the skull through the vertex without intravenous contrast. COMPARISON:  11/29/2020 FINDINGS: Brain: Interval decompression of subdural hematoma on the right with a drain in place. No acute and discrete hemorrhage in the interim. Midline shift has diminished to 4 mm at the septum pellucidum. No entrapment or complicating infarct. Vascular: No hyperdense vessel or unexpected calcification. Skull: Unremarkable right-sided burr hole Sinuses/Orbits: Negative IMPRESSION: Decompressed subdural hemorrhage on the right without complicating  features. Midline shift has diminished to 4 mm. Electronically Signed   By: Monte Fantasia M.D.   On: 02/09/2021 08:04   CT ANGIO CHEST PE W OR WO CONTRAST  Result Date: 02/10/2021 CLINICAL DATA:  Near syncopal episode today when getting up, hypoxic with ambulation, clinical suspicion of pulmonary embolism. History of dilated cardiomyopathy, asthma, CHF, GERD, hypertension EXAM: CT ANGIOGRAPHY CHEST WITH CONTRAST TECHNIQUE: Multidetector CT imaging of the chest was performed using the standard protocol during bolus administration of intravenous contrast. Multiplanar CT image reconstructions and MIPs were obtained to evaluate the vascular anatomy. CONTRAST:  83mL OMNIPAQUE IOHEXOL 350 MG/ML SOLN IV COMPARISON:  CT chest 10/31/2006 FINDINGS: Cardiovascular: Atherosclerotic calcifications aorta without aneurysm or dissection. Cardiac chambers enlarged. No pericardial effusion. Pulmonary arteries adequately opacified. Saddle embolus identified at RIGHT pulmonary hilum extending into RIGHT upper lobe and RIGHT middle lobe. Additional small emboli seen within RIGHT lower lobe, LEFT lower lobe, and LEFT upper lobe pulmonary arteries. Elevated RV/LV ratio = 1.91. RIGHT ventricle appears more dilated than on the previous exam. However it is uncertain how much of the observed RIGHT ventricular dilatation is due to RIGHT heart strain and how much may be related to underlying history of dilated cardiomyopathy. Mediastinum/Nodes: Esophagus unremarkable. Base of cervical region normal appearance. No thoracic adenopathy. Lungs/Pleura: Minimal LEFT upper lobe infiltrate. Remaining lungs clear. Few small nodular  foci at the inferior RIGHT lung appear stable since 2007. No enlarging pulmonary mass identified. No pleural effusion or pneumothorax. Upper Abdomen: Unremarkable Musculoskeletal: No acute abnormalities. Review of the MIP images confirms the above findings. IMPRESSION: Bilateral pulmonary emboli including a saddle  embolus at RIGHT pulmonary hilum extending into RIGHT upper lobe and RIGHT middle lobe as well as multiple small emboli within RIGHT lower lobe, LEFT lower lobe and LEFT upper lobe pulmonary arteries. Positive for acute PE with an elevated RV/LV ratio of 1.91. This value is consistent with RIGHT heart strain and at least submassive PE, and is associated with an increased risk of morbidity and mortality. However it is uncertain as to what portion of the observed RIGHT ventricular dilatation may be related to underlying cardiac dysfunction/dilated cardiomyopathy as opposed to acute pulmonary embolism. Aortic Atherosclerosis (ICD10-I70.0). Critical Value/emergent results were called by telephone at the time of interpretation on 02/10/2021 at 1717 hours to provider Dr. Marcello Moores, Who verbally acknowledged these results. Electronically Signed   By: Lavonia Dana M.D.   On: 02/10/2021 17:18   ECHOCARDIOGRAM COMPLETE  Result Date: 02/10/2021    ECHOCARDIOGRAM REPORT   Patient Name:   John [facility] Date of Exam: 02/10/2021 Medical Rec #:  [id number]        Height:       70.0 in Accession #:    [id number]       Weight:       205.0 lb Date of Birth:  1941/06/02       BSA:          2.109 m Patient Age:    74 years         BP:           122/71 mmHg Patient Gender: M                HR:           60 bpm. Exam Location:  Inpatient Procedure: 2D Echo, Cardiac Doppler and Color Doppler Indications:    Abnormal EKG  History:        Patient has no prior history of Echocardiogram examinations.                 CHF, TIA, Arrythmias:Abnormal EKG and Atrial Fibrillation,                 Signs/Symptoms:Syncope; Risk Factors:Dyslipidemia and                 Hypertension. Subdural hematoma.  Sonographer:    Dustin Flock Referring Phys: [place]  1. There is severe RV enlargement with moderately depressed RV systolic function. The RV apex appears hyperkinetic compared to the base. Findings appear to be new compared  to prior echo report at [facility] (images not available). This is highly concerning for an acute pulmonary embolism.  2. There is moderately elevated pulmonary artery systolic pressure. The estimated right ventricular systolic pressure is 86.5 mmHg.  3. Left ventricular ejection fraction, by estimation, is 50 to 55%. The left ventricle has low normal function. The left ventricle has no regional wall motion abnormalities.  4. There is mild concentric left ventricular hypertrophy. Left ventricular diastolic parameters are consistent with Grade I diastolic dysfunction (impaired relaxation).  5. There is the interventricular septum is flattened in systole, consistent with right ventricular pressure overload.  6. Right atrial size was severely dilated.  7. The mitral valve is normal in structure. Trivial mitral valve regurgitation.  8.  There is mild-to-moderate highly eccentric tricuspid regurgitation.  9. The aortic valve is tricuspid. There is mild calcification of the aortic valve. There is mild thickening of the aortic valve. Aortic valve regurgitation is mild. 10. Aortic dilatation noted. There is mild dilatation of the aortic root, measuring 41 mm. 11. The inferior vena cava is dilated in size with <50% respiratory variability, suggesting right atrial pressure of 15 mmHg. FINDINGS  Left Ventricle: Left ventricular ejection fraction, by estimation, is 50 to 55%. The left ventricle has low normal function. The left ventricle has no regional wall motion abnormalities. The left ventricular internal cavity size was normal in size. There is mild concentric left ventricular hypertrophy. The interventricular septum is flattened in systole, consistent with right ventricular pressure overload. Left ventricular diastolic parameters are consistent with Grade I diastolic dysfunction (impaired relaxation). Right Ventricle: The right ventricular size is severely enlarged. No increase in right ventricular wall thickness. Right  ventricular systolic function is moderately reduced. There is moderately elevated pulmonary artery systolic pressure. The tricuspid regurgitant velocity is 3.12 m/s, and with an assumed right atrial pressure of 15 mmHg, the estimated right ventricular systolic pressure is 17.5 mmHg. Findings concerning for possible pulmonary embolism. Left Atrium: Left atrial size was normal in size. Right Atrium: Right atrial size was severely dilated. Pericardium: There is no evidence of pericardial effusion. Mitral Valve: The mitral valve is normal in structure. Trivial mitral valve regurgitation. Tricuspid Valve: The tricuspid valve is normal in structure. Tricuspid valve regurgitation mild-to-moderate highly eccentric. Aortic Valve: The aortic valve is tricuspid. There is mild calcification of the aortic valve. There is mild thickening of the aortic valve. Aortic valve regurgitation is mild. Pulmonic Valve: The pulmonic valve was normal in structure. Pulmonic valve regurgitation is trivial. Aorta: Aortic dilatation noted. There is mild dilatation of the aortic root, measuring 41 mm. Venous: The inferior vena cava is dilated in size with less than 50% respiratory variability, suggesting right atrial pressure of 15 mmHg. IAS/Shunts: No atrial level shunt detected by color flow Doppler.  LEFT VENTRICLE PLAX 2D LVIDd:         4.60 cm  Diastology LVIDs:         2.90 cm  LV e' medial:    5.11 cm/s LV PW:         1.20 cm  LV E/e' medial:  7.0 LV IVS:        1.20 cm  LV e' lateral:   5.55 cm/s LVOT diam:     2.50 cm  LV E/e' lateral: 6.5 LV SV:         54 LV SV Index:   26 LVOT Area:     4.91 cm  RIGHT VENTRICLE RV Basal diam:  5.40 cm RV S prime:     8.59 cm/s TAPSE (M-mode): 2.8 cm LEFT ATRIUM             Index       RIGHT ATRIUM           Index LA diam:        3.40 cm 1.61 cm/m  RA Area:     24.90 cm LA Vol (A2C):   53.1 ml 25.17 ml/m RA Volume:   87.30 ml  41.39 ml/m LA Vol (A4C):   26.9 ml 12.75 ml/m LA Biplane Vol: 40.6 ml  19.25 ml/m  AORTIC VALVE LVOT Vmax:   64.00 cm/s LVOT Vmean:  47.400 cm/s LVOT VTI:    0.111 m  AORTA Ao Root  diam: 3.60 cm MITRAL VALVE               TRICUSPID VALVE MV Area (PHT): 4.89 cm    TR Peak grad:   38.9 mmHg MV Decel Time: 155 msec    TR Vmax:        312.00 cm/s MV E velocity: 36.00 cm/s MV A velocity: 46.30 cm/s  SHUNTS MV E/A ratio:  0.78        Systemic VTI:  0.11 m                            Systemic Diam: 2.50 cm Gwyndolyn Kaufman MD Electronically signed by Gwyndolyn Kaufman MD Signature Date/Time: 02/10/2021/3:43:21 PM    Final      Assessment and Plan:   Near Syncope Saddle PE - Patient admitted for burr holes for large chronic subdural hematoma. Today while ambulating with PT, patient became minimally responsive, hypoxic, and diaphoretic. She was profoundly hypotensive at the time with systolic BP of 68. No significant arrhythmias noted on telemetry that would explain this event.  - Echo showed  LVEF of 50-55% with normal wall motion and mild LVH. RV is severely enlarged with moderately decreased systolic function. RV apex appears hyperkinetic and PASP moderately elevated at 53.9 mmHg. Findings concerning for acute PE.  - Chest CTA showed  showed bilateral PE including a saddle embolus at right pulmonary hilum extending into right upper lobe and right middle lobe as well as multiple small emboli within right lower lobe, left lower lobe, and left upper lobe pulmonary arteries. There was also evidence of right heart strain. - Saddle PE likely cause of near syncopal episodes. Management per PCCM. Per their note, Dr. Arneta Cliche ok with use of thrombolytic.  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  - Echo this admission as above. - Home ARB and beta-blocker currently on hold due to hypotension. - Continue to monitor volume status closely.  Hypotension - BP as low as 60's/40's. Improving with IV fluids. - Per PCCM note, plan  was to hold home meciations (Toprol, Norvasc, Telmisartan (using Irbesartan here), HCTZ) for now. Looks like they have not been held yet so I will discontinue these for now. When BP stabilizes, I would add back Toprol first given frequent PVCs. - Management per primary team.  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 - Telemetry shows some PVCs (occasionally in bigeminy rhythm) and some short runs of NSVT of about 3 beats. There was one possible 17 beat run of NSVT but looks like lead fell off in the middle of this so it may be more artifact. - Potassium 3.5 today.  - Will check Magnesium. - Home Toprol currently being held due to hypotension. Can restart when BP stabilizes. - Can continue home Amiodarone 200mg  daily. This should not cause hypotension. - Do not think frequent PVCs were cause of above event. - She can follow-up with primary Cardiologist for further management of this. There have been discussion of ablation in the past but patient wanted to try Amiodarone first.  Otherwise, per primary team: - Chronic subdural hypotension - Hypothyroidism - Asthma - GERD   Risk Assessment/Risk Scores:   New York Heart Association (NYHA) Functional Class NYHA Class I   For questions or updates, please contact [facility] Please consult [email or web address] for contact info under  Signed, Darreld Mclean, PA-C  02/10/2021 5:57 PM

## 2021-02-10 NOTE — Progress Notes (Signed)
PCCM Progress Note   Notified by Dr.Heather Johney Frame that ECHO revealed a severely enlarged and hypokinetic RV concerning for an acute PE. On reassessment this afternoon patient is hemodynamically stable without subjective dyspnea or observed hypoxia.   Will proceed with CTA chest to further evaluate for PE.  I also spoke with Dr. Christella Noa and he is in agreement for thrombolytic if needed for treatment for PE if present.    Johnsie Cancel, NP-C [facility] Personal contact information can be found on Amion  If no response please page: Adult pulmonary and critical care medicine pager on Amion unitl 7pm After 7pm please call [phone] 02/10/2021, 4:02 PM

## 2021-02-10 NOTE — Progress Notes (Signed)
Patient ID: DEMAURI ADVINCULA, male   DOB: [birth year], 80 y.o.   MRN: [id number] BP (!) 157/97   Pulse 82   Temp 98 F (36.7 C) (Axillary)   Resp 17   Ht 5\' 10"  (1.778 m)   Wt 93 kg   SpO2 98%   BMI 29.41 kg/m  Alert and oriented x 4. Earlier this morning he had a near syncopal episode. This led to a EKG which revealed evidence of right heart strain, and possible ischemia. He also had multiple PVC's during this time. Cardiology was consulted, and echo ordered. The echo revealed the right heart strain. He will be started on IV heparin without a bolus. Neurologically he is normal , moving all extremities well. I will leave the subdural drain in place at this time.

## 2021-02-10 NOTE — Progress Notes (Signed)
Physical Therapy Treatment Patient Details Name: John Mack MRN: [id number] DOB: [birth year] Today's Date: 02/10/2021    History of Present Illness 80 yo male admitted to [facility] on 2/28, s/p R burr holes and drain placement for chronic R SDH. CTH on 3/2 shows decompressed subdural hemorrhage on the right without complicating features; midline shift has diminished to 4 mm. PMH includes anxiety, skin cancer, HF, dilated cardiomyopathy, depression, HLD, HTN, TIAs, arthroscopies L shoulder and B knees.    PT Comments    Pt eager to get OOB, wondering if he will be able to go home today. At 40 ft ambulation, pt reporting feeling dizzy and tachypneic with SPO2 92%. SpO2 rapidly dropped, to 70s-80s on RA with HR and BP stable. OT joined session at this point to assist in pt safety and management, assisted pt into rolling chair in hallway and 3-4LO2 applied. Pt stabilized in chair after 2 minute seated rest, decision made to ambulate back to room as pt reporting return to baseline. Once pt reached room, pt states "I feel dizzy, I am going to pass out". PT, OT, and RN assisted pt into bed with total assist; pt with sonorous breath sounds, decerebrate posturing, and seizure-like activity with SpO2 in the 80s on 4LO2, O2 increased to 6LO2 and critical care team arrived for further management. telemetry alarming "VTACH" and "PVCs". PT exited room at this point. Will continue to follow acutely.    Follow Up Recommendations  Home health PT;Supervision for mobility/OOB (sister-in-law, children, or pt's wife's assist Leanne to help?)     Equipment Recommendations  None recommended by PT    Recommendations for Other Services OT consult     Precautions / Restrictions Precautions Precautions: Fall Precaution Comments: drain from burr holes, no need to clamp Restrictions Weight Bearing Restrictions: No    Mobility  Bed Mobility Overal bed mobility: Needs Assistance Bed Mobility: Supine to Sit      Supine to sit: Min assist;HOB elevated Sit to supine: Total assist;+2 for physical assistance   General bed mobility comments: Min assist for trunk elevation, LE lowering over EOB. Total assist for return to supine with sonorous breath sounds noted at this time.    Transfers Overall transfer level: Needs assistance Equipment used: 1 person hand held assist Transfers: Sit to/from Stand Sit to Stand: Min assist         General transfer comment: min assist for rise, reaching for environment to steady  Ambulation/Gait Ambulation/Gait assistance: Min assist;+2 safety/equipment Gait Distance (Feet): 40 Feet (2x40 ft) Assistive device: IV Pole;1 person hand held assist Gait Pattern/deviations: Step-through pattern;Decreased stride length;Trunk flexed;Shuffle Gait velocity: decr   General Gait Details: min assist to steady, verbal cuing for increasing step length but not followed as pt with very shuffling steps. At 40 ft ambulation, pt reporting feeling dizzy and tachypneic with SPO2 92%. SpO2 rapidly dropped, to 70s-80s on RA with HR and BP stable. OT joined session at this point to assist in pt safety and management, assisted pt into rolling chair in hallway and 4LO2 applied. Pt stabilized in chair after 2 minute seated rest, decision made to ambulate back to room. Once pt reached room, pt states "I feel dizzy, I am going to pass out". PT, OT, and RN assisted pt into bed; pt with sonorous breath sounds, decerebrate posturing, and seizure-like activity with SpO2 in the 80s on 4LO2, O2 increased to 6LO2 and critical care team arrived for further management. telemetry alarming "VTACH" at PT exit.  Stairs             Wheelchair Mobility    Modified Rankin (Stroke Patients Only)       Balance Overall balance assessment: Needs assistance;History of Falls (reports multiple stumbles/falls since December with initial injury) Sitting-balance support: No upper extremity supported;Feet  supported Sitting balance-[name]: Fair     Standing balance support: Single extremity supported;During functional activity Standing balance-[name]: Fair Standing balance comment: able to stand statically without UE suppport, requires external assist in standing                            Cognition Arousal/Alertness: Awake/alert Behavior During Therapy: WFL for tasks assessed/performed Overall Cognitive Status: Within Functional Limits for tasks assessed                                        Exercises      General Comments        Pertinent Vitals/Pain Pain Assessment: Faces Faces Pain Scale: Hurts a little bit Pain Location: head Pain Descriptors / Indicators: Headache Pain Intervention(s): Limited activity within patient's tolerance;Monitored during session;Repositioned    Home Living                      Prior Function            PT Goals (current goals can now be found in the care plan section) Acute Rehab PT Goals Patient Stated Goal: go home PT Goal Formulation: With patient Time For Goal Achievement: 02/23/21 Potential to Achieve Goals: Good Progress towards PT goals: Progressing toward goals    Frequency    Min 4X/week      PT Plan Current plan remains appropriate    Co-evaluation              AM-PAC PT "6 Clicks" Mobility   Outcome Measure  Help needed turning from your back to your side while in a flat bed without using bedrails?: A Little Help needed moving from lying on your back to sitting on the side of a flat bed without using bedrails?: A Little Help needed moving to and from a bed to a chair (including a wheelchair)?: A Little Help needed standing up from a chair using your arms (e.g., wheelchair or bedside chair)?: A Little Help needed to walk in hospital room?: A Little Help needed climbing 3-5 steps with a railing? : A Little 6 Click Score: 18    End of Session Equipment Utilized  During Treatment: Gait belt Activity Tolerance: Patient tolerated treatment well Patient left: in chair;with call bell/phone within reach;with chair alarm set;with nursing/sitter in room Nurse Communication: Mobility status PT Visit Diagnosis: Other abnormalities of gait and mobility (R26.89);History of falling (Z91.81)     Time: 5956-3875 PT Time Calculation (min) (ACUTE ONLY): 23 min  Charges:  $Gait Training: 8-22 mins                     Stacie Glaze, PT Acute Rehabilitation Services Pager [phone]  Office [phone]   [place] 02/10/2021, 3:25 PM

## 2021-02-11 ENCOUNTER — Inpatient Hospital Stay (HOSPITAL_COMMUNITY): Payer: Medicare Other

## 2021-02-11 DIAGNOSIS — I2699 Other pulmonary embolism without acute cor pulmonale: Secondary | ICD-10-CM | POA: Diagnosis not present

## 2021-02-11 DIAGNOSIS — I6203 Nontraumatic chronic subdural hemorrhage: Principal | ICD-10-CM

## 2021-02-11 LAB — BASIC METABOLIC PANEL
Anion gap: 9 (ref 5–15)
BUN: 12 mg/dL (ref 8–23)
CO2: 25 mmol/L (ref 22–32)
Calcium: 8.7 mg/dL — ABNORMAL LOW (ref 8.9–10.3)
Chloride: 95 mmol/L — ABNORMAL LOW (ref 98–111)
Creatinine, Ser: 0.82 mg/dL (ref 0.61–1.24)
GFR, Estimated: >60 mL/min (ref >60)
Glucose, Bld: 110 mg/dL — ABNORMAL HIGH (ref 70–99)
Potassium: 3.5 mmol/L (ref 3.5–5.1)
Sodium: 129 mmol/L — ABNORMAL LOW (ref 135–145)

## 2021-02-11 LAB — CBC
HCT: 35.2 % — ABNORMAL LOW (ref 39.0–52.0)
Hemoglobin: 12.2 g/dL — ABNORMAL LOW (ref 13.0–17.0)
MCH: 31.4 pg (ref 26.0–34.0)
MCHC: 34.7 g/dL (ref 30.0–36.0)
MCV: 90.5 fL (ref 80.0–100.0)
Platelets: 239 10*3/uL (ref 150–400)
RBC: 3.89 MIL/uL — ABNORMAL LOW (ref 4.22–5.81)
RDW: 13.9 % (ref 11.5–15.5)
WBC: 7.9 10*3/uL (ref 4.0–10.5)
nRBC: 0 % (ref 0.0–0.2)

## 2021-02-11 LAB — HEPARIN LEVEL (UNFRACTIONATED)
Heparin Unfractionated: 0.53 IU/mL (ref 0.30–0.70)
Heparin Unfractionated: 0.65 IU/mL (ref 0.30–0.70)
Heparin Unfractionated: 0.47 IU/mL (ref 0.30–0.70)

## 2021-02-11 MED ORDER — MAGNESIUM SULFATE 2 GM/50ML IV SOLN
2.0000 g | Freq: Once | INTRAVENOUS | Status: AC
  Administered 2021-02-11: 2 g via INTRAVENOUS
  Filled 2021-02-11: qty 50

## 2021-02-11 MED ORDER — SENNOSIDES-DOCUSATE SODIUM 8.6-50 MG PO TABS
1.0000 | ORAL_TABLET | Freq: Two times a day (BID) | ORAL | Status: DC
  Administered 2021-02-11 – 2021-02-16 (×6): 1 via ORAL
  Filled 2021-02-11 (×10): qty 1

## 2021-02-11 MED ORDER — MAGNESIUM CITRATE PO SOLN
1.0000 | Freq: Once | ORAL | Status: AC
  Administered 2021-02-11: 1 via ORAL
  Filled 2021-02-11: qty 296

## 2021-02-11 MED ORDER — DOCUSATE SODIUM 100 MG PO CAPS
100.0000 mg | ORAL_CAPSULE | Freq: Two times a day (BID) | ORAL | Status: DC
  Administered 2021-02-11 – 2021-02-16 (×5): 100 mg via ORAL
  Filled 2021-02-11 (×10): qty 1

## 2021-02-11 NOTE — Progress Notes (Signed)
Lower extremity venous has been completed.   Preliminary results in CV Proc.   Abram Sander 02/11/2021 9:40 AM

## 2021-02-11 NOTE — Progress Notes (Signed)
Patient ID: DERREK PUFF, male   DOB: Nov 08, 1941, 80 y.o.   MRN: [id number] BP 122/71   Pulse 69   Temp 99 F (37.2 C) (Oral)   Resp 17   Ht 5\' 10"  (1.778 m)   Wt 93 kg   SpO2 100%   BMI 29.41 kg/m  Alert and oriented x 4, speech is clear and fluent Perrl, full eom Symmetric facies, tongue and uvula midline Moving all extremities well Will remove drain tomorrow

## 2021-02-11 NOTE — Care Management (Signed)
TOC consult for discharge planning on Lovenox.  Will need dosage of Lovenox and frequency in order to check benefits.   Reinaldo Raddle, RN, BSN  Trauma/Neuro ICU Case Manager [phone]

## 2021-02-11 NOTE — Progress Notes (Signed)
PT Cancellation Note  Patient Details Name: John Mack MRN: [id number] DOB: [birth year]   Cancelled Treatment:    Reason Eval/Treat Not Completed: Medical issues which prohibited therapy. Pt started on heparin on 3/3 at 17:37 due to newly found saddle pulmonary embolus. Per PT protocol, will wait until >24 hours after start of heparin. Will follow-up as able when medically appropriate.  Moishe Spice, PT, DPT Acute Rehabilitation Services  Pager: [phone] Office: [place] 02/11/2021, 7:45 AM

## 2021-02-11 NOTE — Progress Notes (Signed)
ANTICOAGULATION CONSULT NOTE   Pharmacy Consult for Heparin Indication: Saddle PE  No Known Allergies  Patient Measurements: Height: 5\' 10"  (177.8 cm) Weight: 93 kg (205 lb) IBW/kg (Calculated) : 73 Heparin Dosing Weight:  92 kg  Vital Signs: Temp: 98.5 F (36.9 C) (03/04 0000) Temp Source: Oral (03/04 0000) BP: 99/61 (03/04 0319) Pulse Rate: 62 (03/04 0319)  Labs: Recent Labs    02/10/21 1156 02/10/21 1831 02/10/21 1925 02/10/21 2114 02/11/21 0152  HGB 11.2*  --   --   --  12.2*  HCT 34.1*  --   --   --  35.2*  PLT 265  --   --   --  239  APTT  --   --   --  58*  --   HEPARINUNFRC  --   --   --   --  0.53  CREATININE 0.93  --   --   --   --   TROPONINIHS  --  2,721* 3,387*  --   --     Estimated Creatinine Clearance: 76.2 mL/min (by C-G formula based on SCr of 0.93 mg/dL).   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 Start IV heparin for BL PE + saddle embolus. RV/LV 1.91. Hgb 14.1>11.2.- per critical care note Dr. Christella Noa in agreement for thrombolytic if needed for treatment for PE if present. Pt has been asymptomatic currently on RA.  Heparin level slightly supratherapeutic for lower goal (0.53) on gtt at 1500 units/hr.  Goal of Therapy:  Heparin level 0.3-0.5 units/ml Monitor platelets by anticoagulation protocol: Yes   Plan:  Decrease heparin to 1450 units/hr F/u 8 hr heparin level  Sherlon Handing, PharmD, BCPS Please see amion for complete clinical pharmacist phone list 02/11/2021,3:56 AM

## 2021-02-11 NOTE — Plan of Care (Signed)
  Problem: Activity: Goal: Risk for activity intolerance will decrease Outcome: Progressing   Problem: Nutrition: Goal: Adequate nutrition will be maintained Outcome: Progressing   Problem: Elimination: Goal: Will not experience complications related to bowel motility Outcome: Progressing Goal: Will not experience complications related to urinary retention Outcome: Progressing   Problem: Pain Managment: Goal: General experience of comfort will improve Outcome: Progressing   

## 2021-02-11 NOTE — Progress Notes (Signed)
ANTICOAGULATION CONSULT NOTE   Pharmacy Consult for Heparin Indication: Saddle PE  No Known Allergies  Patient Measurements: Height: 5\' 10"  (177.8 cm) Weight: 93 kg (205 lb) IBW/kg (Calculated) : 73 Heparin Dosing Weight:  92 kg  Vital Signs: Temp: 99 F (37.2 C) (03/04 0800) Temp Source: Oral (03/04 0800) BP: 133/119 (03/04 0800) Pulse Rate: 61 (03/04 0700)  Labs: Recent Labs    02/10/21 1156 02/10/21 1831 02/10/21 1925 02/10/21 2114 02/11/21 0152 02/11/21 1146  HGB 11.2*  --   --   --  12.2*  --   HCT 34.1*  --   --   --  35.2*  --   PLT 265  --   --   --  239  --   APTT  --   --   --  58*  --   --   HEPARINUNFRC  --   --   --   --  0.53 0.65  CREATININE 0.93  --   --   --   --  0.82  TROPONINIHS  --  2,721* 3,387*  --   --   --     Estimated Creatinine Clearance: 86.4 mL/min (by C-G formula based on SCr of 0.82 mg/dL).   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 Start IV heparin for BL PE + saddle embolus. RV/LV 1.91. Hgb 14.1>11.2.- per critical care note Dr. Christella Noa in agreement for thrombolytic if needed for treatment for PE if present. Pt has been asymptomatic currently on RA.  Heparin level slightly supratherapeutic for lower goal   Goal of Therapy:  Heparin level 0.3-0.5 units/ml Monitor platelets by anticoagulation protocol: Yes   Plan:  Decrease heparin to 1300 units/hr F/u 8 hr heparin level  Barth Kirks, PharmD, BCCCP Clinical Pharmacist [phone]  Please check AMION for all [place] numbers  02/11/2021 1:12 PM

## 2021-02-11 NOTE — Progress Notes (Signed)
ANTICOAGULATION CONSULT NOTE   Pharmacy Consult for Heparin Indication: Saddle PE  No Known Allergies  Patient Measurements: Height: 5\' 10"  (177.8 cm) Weight: 93 kg (205 lb) IBW/kg (Calculated) : 73 Heparin Dosing Weight:  92 kg  Vital Signs: Temp: 97.6 F (36.4 C) (03/04 2000) Temp Source: Oral (03/04 2000) BP: 129/85 (03/04 2100) Pulse Rate: 74 (03/04 2100)  Labs: Recent Labs    02/10/21 1156 02/10/21 1831 02/10/21 1925 02/10/21 2114 02/11/21 0152 02/11/21 1146 02/11/21 2103  HGB 11.2*  --   --   --  12.2*  --   --   HCT 34.1*  --   --   --  35.2*  --   --   PLT 265  --   --   --  239  --   --   APTT  --   --   --  58*  --   --   --   HEPARINUNFRC  --   --   --   --  0.53 0.65 0.47  CREATININE 0.93  --   --   --   --  0.82  --   TROPONINIHS  --  2,721* 3,387*  --   --   --   --     Estimated Creatinine Clearance: 86.4 mL/min (by C-G formula based on SCr of 0.82 mg/dL).   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 Start IV heparin for BL PE + saddle embolus. RV/LV 1.91.    -heparin level at goal on 1300 units/hr  Goal of Therapy:  Heparin level 0.3-0.5 units/ml Monitor platelets by anticoagulation protocol: Yes   Plan:  Continue heparin at 1300 units/hr Daily heparin level and CBC  Hildred Laser, PharmD Clinical Pharmacist **Pharmacist phone directory can now be found on [email or web address] ([email or web address]).  Listed under [place].

## 2021-02-11 NOTE — Consult Note (Addendum)
NAME:  John Mack, MRN:  [id number], DOB:  1941/04/03, LOS: 4 ADMISSION DATE:  02/07/2021, CONSULTATION DATE:  02/10/2021 REFERRING MD:  Dr. Christella Noa, CHIEF COMPLAINT:  Presyncope   Brief History:  Patient presented for elective surgical correction of chronic subdural hematoma. PCCM consulted afternoon of 3/3 for symptomatic presyncopal episode.Marland Kitchen  History of Present Illness:  John Mack is a 80 year old male with a past medical history significant for hypothyroidism, hypertension, hyperlipidemia, frequent PVCs, dysrhythmia, dilated cardiomyopathy, skin cancer, and prior TIAs who presented fo for elective evacuation of chronic subdural hematoma by neurosurgery.  Patient suffered mechanical fall December 2021 that resulted in right frontal subdural hematoma and subarachnoid hemorrhage.  Patient initially refused transfer to emergency department follow-up with primary care physician day after a fall at which time CT scan revealed SDH and SAH.  Neurosurgery at that time recommended repeat head CT which was completed 2/22 that revealed significant progression of subdural hematoma.  Patient was advised to present to the emergency department at that elective instead to follow-up with neurosurgery in the outpatient setting.  Patient presented and underwent placement of right bur holes for subdural hematoma evacuation with Dr. Christella Noa 2/28.  Initially patient tolerated procedure well with no postoperative complications.  On 3/3 while ambulating with physical therapy patient was seen with symptomatic presyncope.  He was seen minimally responsive, hypoxic, and diaphoretic.  PCCM was called urgently to bedside for possible assistance with airway.  On arrival patient was seen minimally responsive hypoxic with SPO2 of 78, cyanotic, and with snoring respirations.  Ambu bag was placed but no breaths given and patient quickly became more responsive.  Review of telemetry strip at time of episode revealed very  frequent PVCs almost every other beat.  Of note patient sees cardiologist at [facility] for difficult to treat PVCs and he was placed on p.o. amiodarone.  The cardiologist recommended ambulation but due to subdural hematoma the cardiology team there decided to treat medically.  Past Medical History:  Hypothyroidism, hypertension, hyperlipidemia, frequent PVCs, dysrhythmia, dilated cardiomyopathy, skin cancer, and prior TIAs  Significant Hospital Events:  Admitted for elective SDH evacutaition   Consults:  PCCM Cardiology  Procedures:  Right bur holes for evacuation of subdural hematoma 2/28  Significant Diagnostic Tests:  Head CT 3/2 > decompressed subdural hematoma on the right without complicating features, decreased midline shift  Micro Data:  MRSA PCR 2/28 > negative  Antimicrobials:     Interim History / Subjective:  As above  Objective   Blood pressure (!) 133/119, pulse 61, temperature 99 F (37.2 C), temperature source Oral, resp. rate 18, height 5\' 10"  (1.778 m), weight 93 kg, SpO2 93 %.        Intake/Output Summary (Last 24 hours) at 02/11/2021 0924 Last data filed at 02/11/2021 0700 Gross per 24 hour  Intake 1774.5 ml  Output 4567 ml  Net -2792.5 ml   Filed Weights   02/07/21 1228  Weight: 93 kg    Examination: General: WDWN pleasant older adult M seated in bed NAD eating breakfast   HEENT: R frontal surgical site c/d/well approximated. EVD in place. Anicteric sclera  Neuro: Alert and oriented x3, nonfocal CV: rrr cap refill brisk, 2+ peripheral pulses PULM: Symmetrical chest expansion, even and unlabored on RA GI: soft round ndnt hypoactive  Extremities: no acute deformity  Skin: Pale c/d/w   Resolved Hospital Problem list     Assessment & Plan:   Chronic SDH-- s/p burr holes, EVD  -Dr. Christella Noa P: -Per  NSGY -EVD per NSGY  Pulmonary Embolism, with presyncopal event  P: -Heparin gtt per pharmacist dosing -f/u Lower ext dopplers  -CCM to continue  following in this setting  Hypotension, itermittent improving  -normotensive 3/4 but in review of flowsheet looks like intermittently hypotensive overnight. ? If this is r/t sleep, NIBP position, or other etiology. No s/sx poor perfusion 3/4 P: -Hold home antihypertensives  -Cardiac monitoring   Systolic CHF Frequent PVCs -Patient is followed by [facility] cardiology with last office visit 01/19/2021. -Primary cardiologist at [facility] recommended ablation for frequent PVCs but due to subdural hematoma decision was made to treat medically with amiodarone and beta-blocker due to inability to utilize anticoagulants. P: -amio  -electrolyte optimization: ordering 2g mag 3/4. Checking BMP -- will correct K if needed   Hypothyroidism -Home medications include Synthroid P: -synthroid   Hyponatremia, mild  P: -trend. Ordering 3/4 BMP and 3/5 AM BMP -no correction indicated at present, pt is taking POs  Constipation P -changing PRNs to [facility] BID -1x mag citrate   Best practice (evaluated daily)  Diet: Cardiac  Pain/Anxiety/Delirium protocol (if indicated): As needed VAP protocol (if indicated): N/A DVT prophylaxis: hep gtt  GI prophylaxis: PPI Glucose control: Monitor  Mobility: Per primary  Disposition: Remains in ICU   Goals of Care:  Per primary   Labs   CBC: Recent Labs  Lab 02/07/21 1219 02/10/21 1156 02/11/21 0152  WBC 6.5 7.4 7.9  HGB 13.1 11.2* 12.2*  HCT 38.5* 34.1* 35.2*  MCV 91.2 94.5 90.5  PLT 315 265 025    Basic Metabolic Panel: Recent Labs  Lab 02/07/21 1219 02/10/21 1156 02/10/21 1831  NA 126* 131*  --   K 3.7 3.5  --   CL 91* 98  --   CO2 26 23  --   GLUCOSE 92 119*  --   BUN 12 14  --   CREATININE 0.88 0.93  --   CALCIUM 9.1 8.0*  --   MG  --   --  1.9   GFR: Estimated Creatinine Clearance: 76.2 mL/min (by C-G formula based on SCr of 0.93 mg/dL). Recent Labs  Lab 02/07/21 1219 02/10/21 1156 02/11/21 0152  WBC 6.5 7.4 7.9    Liver Function  Tests: Recent Labs  Lab 02/10/21 1156  AST 19  ALT 12  ALKPHOS 36*  BILITOT 0.7  PROT 5.3*  ALBUMIN 2.8*   No results for input(s): LIPASE, AMYLASE in the last 168 hours. No results for input(s): AMMONIA in the last 168 hours.  ABG    Component Value Date/Time   TCO2 23 04/06/2009 1535     Coagulation Profile: No results for input(s): INR, PROTIME in the last 168 hours.  Cardiac Enzymes: No results for input(s): CKTOTAL, CKMB, CKMBINDEX, TROPONINI in the last 168 hours.  HbA1C: Hgb A1c MFr Bld  Date/Time Value Ref Range Status  04/08/2009 03:33 AM  4.6 - 6.1 % Final   5.6 (NOTE) The ADA recommends the following therapeutic goal for glycemic control related to Hgb A1c measurement: Goal of therapy: <6.5 Hgb A1c  Reference: American Diabetes Association: Clinical Practice Recommendations 2010, Diabetes Care, 2010, 33: (Suppl  1).  04/07/2009 08:14 AM  4.6 - 6.1 % Final   5.7 (NOTE) The ADA recommends the following therapeutic goal for glycemic control related to Hgb A1c measurement: Goal of therapy: <6.5 Hgb A1c  Reference: American Diabetes Association: Clinical Practice Recommendations 2010, Diabetes Care, 2010, 33: (Suppl  1).    CBG: No  results for input(s): GLUCAP in the last 168 hours.  CCT: n/a  Eliseo Gum MSN, AGACNP-BC [place] [phone] If no answer, [phone] 02/11/2021, 9:24 AM

## 2021-02-12 DIAGNOSIS — I2602 Saddle embolus of pulmonary artery with acute cor pulmonale: Secondary | ICD-10-CM

## 2021-02-12 DIAGNOSIS — I5022 Chronic systolic (congestive) heart failure: Secondary | ICD-10-CM | POA: Diagnosis not present

## 2021-02-12 DIAGNOSIS — E871 Hypo-osmolality and hyponatremia: Secondary | ICD-10-CM

## 2021-02-12 DIAGNOSIS — I6203 Nontraumatic chronic subdural hemorrhage: Secondary | ICD-10-CM | POA: Diagnosis not present

## 2021-02-12 LAB — BASIC METABOLIC PANEL
Anion gap: 8 (ref 5–15)
BUN: 14 mg/dL (ref 8–23)
CO2: 25 mmol/L (ref 22–32)
Calcium: 8.3 mg/dL — ABNORMAL LOW (ref 8.9–10.3)
Chloride: 94 mmol/L — ABNORMAL LOW (ref 98–111)
Creatinine, Ser: 0.78 mg/dL (ref 0.61–1.24)
GFR, Estimated: >60 mL/min (ref >60)
Glucose, Bld: 120 mg/dL — ABNORMAL HIGH (ref 70–99)
Potassium: 3.5 mmol/L (ref 3.5–5.1)
Sodium: 127 mmol/L — ABNORMAL LOW (ref 135–145)

## 2021-02-12 LAB — CBC
HCT: 33.6 % — ABNORMAL LOW (ref 39.0–52.0)
Hemoglobin: 12 g/dL — ABNORMAL LOW (ref 13.0–17.0)
MCH: 32.2 pg (ref 26.0–34.0)
MCHC: 35.7 g/dL (ref 30.0–36.0)
MCV: 90.1 fL (ref 80.0–100.0)
Platelets: 236 10*3/uL (ref 150–400)
RBC: 3.73 MIL/uL — ABNORMAL LOW (ref 4.22–5.81)
RDW: 14.1 % (ref 11.5–15.5)
WBC: 8 10*3/uL (ref 4.0–10.5)
nRBC: 0 % (ref 0.0–0.2)

## 2021-02-12 LAB — HEPARIN LEVEL (UNFRACTIONATED): Heparin Unfractionated: 0.42 IU/mL (ref 0.30–0.70)

## 2021-02-12 MED ORDER — DIPHENHYDRAMINE-ZINC ACETATE 2-0.1 % EX CREA
TOPICAL_CREAM | Freq: Three times a day (TID) | CUTANEOUS | Status: DC | PRN
  Filled 2021-02-12: qty 28

## 2021-02-12 MED ORDER — METOPROLOL SUCCINATE ER 25 MG PO TB24
25.0000 mg | ORAL_TABLET | Freq: Two times a day (BID) | ORAL | Status: DC
  Administered 2021-02-12 – 2021-02-16 (×8): 25 mg via ORAL
  Filled 2021-02-12 (×8): qty 1

## 2021-02-12 MED ORDER — APIXABAN 5 MG PO TABS
10.0000 mg | ORAL_TABLET | Freq: Two times a day (BID) | ORAL | Status: DC
  Administered 2021-02-12 – 2021-02-16 (×9): 10 mg via ORAL
  Filled 2021-02-12 (×9): qty 2

## 2021-02-12 MED ORDER — APIXABAN 5 MG PO TABS: 5.0000 mg | ORAL_TABLET | Freq: Two times a day (BID) | ORAL | Status: DC

## 2021-02-12 NOTE — Discharge Instructions (Signed)
Information on my medicine - ELIQUIS (apixaban)  This medication education was reviewed with me or my healthcare representative as part of my discharge preparation.  Why was Eliquis prescribed for you? Eliquis was prescribed to treat blood clots that may have been found in the veins of your legs (deep vein thrombosis) or in your lungs (pulmonary embolism) and to reduce the risk of them occurring again.  What do You need to know about Eliquis ? The starting dose is 10 mg (two 5 mg tablets) taken TWICE daily for the FIRST SEVEN (7) DAYS, then on (enter date)  02/19/2021  the dose is reduced to ONE 5 mg tablet taken TWICE daily.  Eliquis may be taken with or without food.   Try to take the dose about the same time in the morning and in the evening. If you have difficulty swallowing the tablet whole please discuss with your pharmacist how to take the medication safely.  Take Eliquis exactly as prescribed and DO NOT stop taking Eliquis without talking to the doctor who prescribed the medication.  Stopping may increase your risk of developing a new blood clot.  Refill your prescription before you run out.  After discharge, you should have regular check-up appointments with your healthcare provider that is prescribing your Eliquis.    What do you do if you miss a dose? If a dose of ELIQUIS is not taken at the scheduled time, take it as soon as possible on the same day and twice-daily administration should be resumed. The dose should not be doubled to make up for a missed dose.  Important Safety Information A possible side effect of Eliquis is bleeding. You should call your healthcare provider right away if you experience any of the following: ? Bleeding from an injury or your nose that does not stop. ? Unusual colored urine (red or dark brown) or unusual colored stools (red or black). ? Unusual bruising for unknown reasons. ? A serious fall or if you hit your head (even if there is no  bleeding).  Some medicines may interact with Eliquis and might increase your risk of bleeding or clotting while on Eliquis. To help avoid this, consult your healthcare provider or pharmacist prior to using any new prescription or non-prescription medications, including herbals, vitamins, non-steroidal anti-inflammatory drugs (NSAIDs) and supplements.  This website has more information on Eliquis (apixaban): http://[email or web address].com/eliquis/home

## 2021-02-12 NOTE — Progress Notes (Signed)
Patient complaining of itching.[facility] Progress Note Patient Name: John Mack DOB: 1941/10/15 MRN: [id number]   Date of Service  02/12/2021  HPI/Events of Note  Patient is itching.  eICU Interventions  PRN Benadryl ordered.        Kerry Kass Brodin Gelpi 02/12/2021, 6:44 AM

## 2021-02-12 NOTE — Progress Notes (Signed)
ANTICOAGULATION CONSULT NOTE   Pharmacy Consult for Heparin Indication: Saddle PE  No Known Allergies  Patient Measurements: Height: 5\' 10"  (177.8 cm) Weight: 93 kg (205 lb) IBW/kg (Calculated) : 73 Heparin Dosing Weight:  92 kg  Vital Signs: Temp: 98.8 F (37.1 C) (03/05 0800) Temp Source: Oral (03/05 0800) BP: 133/78 (03/05 0800) Pulse Rate: 73 (03/05 0800)  Labs: Recent Labs    02/10/21 1156 02/10/21 1156 02/10/21 1831 02/10/21 1925 02/10/21 2114 02/11/21 0152 02/11/21 1146 02/11/21 2103 02/12/21 0306  HGB 11.2*  --   --   --   --  12.2*  --   --  12.0*  HCT 34.1*  --   --   --   --  35.2*  --   --  33.6*  PLT 265  --   --   --   --  239  --   --  236  APTT  --   --   --   --  58*  --   --   --   --   HEPARINUNFRC  --    < >  --   --   --  0.53 0.65 0.47 0.42  CREATININE 0.93  --   --   --   --   --  0.82  --  0.78  TROPONINIHS  --   --  2,721* 3,387*  --   --   --   --   --    < > = values in this interval not displayed.    Estimated Creatinine Clearance: 88.6 mL/min (by C-G formula based on SCr of 0.78 mg/dL).   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 Start IV heparin for BL PE + saddle embolus. RV/LV 1.91. Hg low stable at 12, plt wnl.  Heparin level remains therapeutic for lower goal. No active bleeding issues reported.  Goal of Therapy:  Heparin level 0.3-0.5 units/ml Monitor platelets by anticoagulation protocol: Yes   Plan:  Continue heparin at 1300 units/hr Monitor daily heparin level and CBC, s/sx bleeding   Arturo Morton, PharmD, BCPS Please check AMION for all [facility] contact numbers Clinical Pharmacist 02/12/2021 8:15 AM

## 2021-02-12 NOTE — Progress Notes (Signed)
Overall doing well.  Awake and alert.  Oriented and appropriate.  No headache.  No chest pain.  Oxygenating well.  Patient tolerating heparin without difficulty.  We will carefully remove his subdural drain today.  Mobilize some.  Patient okay to transition to Eliquis per medical team.

## 2021-02-12 NOTE — Progress Notes (Addendum)
Occupational Therapy Treatment Patient Details Name: John Mack MRN: [id number] DOB: [birth year] Today's Date: 02/12/2021    History of present illness 80 yo male admitted to [facility] on 2/28, s/p R burr holes and drain placement for chronic R SDH. CTH on 3/2 shows decompressed subdural hemorrhage on the right without complicating features; midline shift has diminished to 4 mm. On 3/3 pt had episode of pre-syncope with therapy and decreasing O2 sats with rapid response called. Pt was then found to have submassive PE and started on Heparin. PMH includes anxiety, skin cancer, HF, dilated cardiomyopathy, depression, HLD, HTN, TIAs, arthroscopies L shoulder and B knees.   OT comments  Pt progressing towards established OT goals. Pt performing bed mobility with Supervision. Pt performing functional mobility in hallway with Min guard A and RW. Pt reporting he was feeling nervous about mobility due to medical event on Thursday, but now feels he will get stronger and be able to go home. Also noting pt presenting with decreased ST memory compared to last session and repeating certain questions despite repeated education. Pt will requiring HHOT, 3N1, and RW upon dc. Will continue to follow acutely as admitted.   HR 80-100s, SpO2 90s on RA, RR 20s, and BP supine 144/89 and 132/89 after mobility   Follow Up Recommendations  Home health OT    Equipment Recommendations  3 in 1 bedside commode;Other (comment) (RW)    Recommendations for Other Services PT consult    Precautions / Restrictions Precautions Precautions: Fall Precaution Comments: drain from burr holes, no need to clamp Restrictions Weight Bearing Restrictions: No       Mobility Bed Mobility Overal bed mobility: Needs Assistance Bed Mobility: Supine to Sit     Supine to sit: Supervision;HOB elevated     General bed mobility comments: Supervision for safety    Transfers Overall transfer level: Needs assistance Equipment used:  Rolling walker (2 wheeled) Transfers: Sit to/from Stand Sit to Stand: Min guard         General transfer comment: Min guard A for safety    Balance Overall balance assessment: Needs assistance Sitting-balance support: No upper extremity supported;Feet supported Sitting balance-[name]: Fair     Standing balance support: No upper extremity supported;During functional activity Standing balance-[name]: Fair Standing balance comment: wanting RW for comfort                           ADL either performed or assessed with clinical judgement   ADL Overall ADL's : Needs assistance/impaired                         Toilet Transfer: Min guard;Ambulation;RW (simulated to recliner) Toilet Transfer Details (indicate cue type and reason): Min Guard A for safety         Functional mobility during ADLs: Min guard;Rolling walker General ADL Comments: Pt performing mobility with Min Guard A for safety. Reporting feeling uncertain since medical event on Thursday. Pt demonstrating decreased strength and balance but nothing significant compared to PT eval and OT eval. Pt did present this session with memory deficits that were not noted on Thursday.     Vision       Perception     Praxis      Cognition Arousal/Alertness: Awake/alert Behavior During Therapy: WFL for tasks assessed/performed Overall Cognitive Status: Impaired/Different from baseline Area of Impairment: Memory;Following commands  Memory: Decreased short-term memory Following Commands: Follows one step commands with increased time       General Comments: Pt repeating certain questions despite reeducation. Pt requiring increased time for processing        Exercises     Shoulder Instructions       General Comments HR 80-100s, SpO2 90s on RA, RR 20s, and BP supine 144/89 and 132/89 after mobility    Pertinent Vitals/ Pain       Pain Assessment: Faces Faces  Pain Scale: No hurt Pain Intervention(s): Monitored during session  Home Living                                          Prior Functioning/Environment              Frequency  Min 2X/week        Progress Toward Goals  OT Goals(current goals can now be found in the care plan section)  Progress towards OT goals: Progressing toward goals  Acute Rehab OT Goals Patient Stated Goal: go home OT Goal Formulation: With patient Time For Goal Achievement: 02/24/21 Potential to Achieve Goals: Good ADL Goals Pt Will Perform Grooming: with modified independence;standing Pt Will Perform Lower Body Dressing: with modified independence;sitting/lateral leans;sit to/from stand Pt Will Transfer to Toilet: with modified independence;ambulating;regular height toilet Pt Will Perform Toileting - Clothing Manipulation and hygiene: with modified independence;sitting/lateral leans;sit to/from stand Pt Will Perform Tub/Shower Transfer: Shower transfer;3 in 1;ambulating;with supervision  Plan Discharge plan remains appropriate    Co-evaluation                 AM-PAC OT "6 Clicks" Daily Activity     Outcome Measure   Help from another person eating meals?: None Help from another person taking care of personal grooming?: A Little Help from another person toileting, which includes using toliet, bedpan, or urinal?: A Little Help from another person bathing (including washing, rinsing, drying)?: A Little Help from another person to put on and taking off regular upper body clothing?: A Little Help from another person to put on and taking off regular lower body clothing?: A Little 6 Click Score: 19    End of Session Equipment Utilized During Treatment: Gait belt;Rolling walker  OT Visit Diagnosis: Unsteadiness on feet (R26.81);Other abnormalities of gait and mobility (R26.89);Muscle weakness (generalized) (M62.81)   Activity Tolerance Patient tolerated treatment well    Patient Left in chair;with call bell/phone within reach;with chair alarm set;with nursing/sitter in room   Nurse Communication Mobility status        Time: 7829-5621 OT Time Calculation (min): 20 min  Charges: OT General Charges $OT Visit: 1 Visit OT Treatments $Self Care/Home Management : 8-22 mins  Ardella Chhim MSOT, OTR/L Acute Rehab Pager: [phone] Office: [phone]   Theodoro Grist Janei Scheff 02/12/2021, 11:59 AM

## 2021-02-12 NOTE — Progress Notes (Signed)
NAME:  John Mack, MRN:  [id number], DOB:  Jul 03, 1941, LOS: 5 ADMISSION DATE:  02/07/2021, CONSULTATION DATE:  02/10/2021 REFERRING MD:  Dr. Christella Noa, CHIEF COMPLAINT:  Presyncope   Brief History:  Patient presented for elective surgical correction of chronic subdural hematoma. PCCM consulted afternoon of 3/3 for symptomatic presyncopal episode.Marland Kitchen  History of Present Illness:  John Mack is a 80 year old male with a past medical history significant for hypothyroidism, hypertension, hyperlipidemia, frequent PVCs, dysrhythmia, dilated cardiomyopathy, skin cancer, and prior TIAs who presented fo for elective evacuation of chronic subdural hematoma by neurosurgery.  Patient suffered mechanical fall December 2021 that resulted in right frontal subdural hematoma and subarachnoid hemorrhage.  Patient initially refused transfer to emergency department follow-up with primary care physician day after a fall at which time CT scan revealed SDH and SAH.  Neurosurgery at that time recommended repeat head CT which was completed 2/22 that revealed significant progression of subdural hematoma.  Patient was advised to present to the emergency department at that elective instead to follow-up with neurosurgery in the outpatient setting.  Patient presented and underwent placement of right bur holes for subdural hematoma evacuation with Dr. Christella Noa 2/28.  Initially patient tolerated procedure well with no postoperative complications.  On 3/3 while ambulating with physical therapy patient was seen with symptomatic presyncope.  He was seen minimally responsive, hypoxic, and diaphoretic.  PCCM was called urgently to bedside for possible assistance with airway.  On arrival patient was seen minimally responsive hypoxic with SPO2 of 78, cyanotic, and with snoring respirations.  Ambu bag was placed but no breaths given and patient quickly became more responsive.  Review of telemetry strip at time of episode revealed very  frequent PVCs almost every other beat.  Of note patient sees cardiologist at [facility] for difficult to treat PVCs and he was placed on p.o. amiodarone.  The cardiologist recommended ambulation but due to subdural hematoma the cardiology team there decided to treat medically.  Past Medical History:  Hypothyroidism, hypertension, hyperlipidemia, frequent PVCs, dysrhythmia, dilated cardiomyopathy, skin cancer, and prior TIAs  Significant Hospital Events:  Admitted for elective SDH evacutation  3/3> submassive PE, responded to IVF, treated with heparin gtt 3/5> transitioned to apixaban  Consults:  PCCM Cardiology  Procedures:  Right bur holes for evacuation of subdural hematoma 2/28  Significant Diagnostic Tests:  Head CT 3/2 > decompressed subdural hematoma on the right without complicating features, decreased midline shift  Micro Data:  MRSA PCR 2/28 > negative  Antimicrobials:  Cefazolin 2/28>   Interim History / Subjective:  This morning he denies complaints. He is feeling well. He was able to walk with PT today.  Objective   Blood pressure 128/79, pulse 80, temperature 99 F (37.2 C), temperature source Oral, resp. rate (!) 22, height 5\' 10"  (1.778 m), weight 93 kg, SpO2 93 %.        Intake/Output Summary (Last 24 hours) at 02/12/2021 0747 Last data filed at 02/12/2021 0700 Gross per 24 hour  Intake 626.34 ml  Output 2850 ml  Net -2223.66 ml   Filed Weights   02/07/21 1228  Weight: 93 kg    Examination: General: Elderly man sitting up in the chair no acute distress HEENT: Bandage over removed drain site, eyes anicteric Neuro: Awake and alert, answering questions appropriately, normal speech, moving all extremities spontaneously. CV: S1-S2, regular rhythm, no murmurs PULM: Breathing comfortably on room air, CTA B, speaking full sentences GI: Soft, nontender, nondistended Extremities: No lower extremity edema, no clubbing  or cyanosis Skin: Warm, dry, no  rashes  Resolved Hospital Problem list     Assessment & Plan:   Chronic SDH-- s/p burr holes, EVD  -Dr. Christella Noa P: -Appreciate neurosurgery's management, drain removed today  Acute submassive pulmonary embolism w/ presyncopal event  P: -Heparin gtt per pharmacist dosing>> transitioning today to apixaban.  Will need 3 months of treatment for provoked DVT.  Will need follow-up echo in 1 to 2 months and clinic follow-up with pulmonary -Okay to start resuming cardiac meds; monitor blood pressure closely  Hypotension, resolved -normotensive 3/4 but in review of flowsheet looks like intermittently hypotensive overnight.  P: -Holding home amlodipine.  Will restart metoprolol XL and monitor blood pressure. -Cardiac monitoring   Chronic HFrEF Frequent PVCs -Patient is followed by [facility] cardiology with last office visit 01/19/2021. -Primary cardiologist at [facility] recommended ablation for frequent PVCs but due to subdural hematoma decision was made to treat medically with amiodarone and beta-blocker due to inability to utilize anticoagulants. P: -Remains on amio  -Continue to optimize electrolytes -Resume PTA metoprolol XL 25 mg twice daily.  Continue to monitor blood pressure closely before adding back other cardiac meds. -Telemetry monitoring   Hypothyroidism -Home medications include Synthroid P: -Continue synthroid   Hyponatremia, mild-potentially due to chronic heart failure P: -Continue to monitor -Monitor I/O, avoid hypervolemia  Acute anemia, likely due to acute illness -Transfuse hemoglobin less than 7 or hemodynamically significant bleeding -Monitor for signs of bleeding on anticoagulation  Constipation-resolved P: -Continue bowel regimen  Best practice (evaluated daily)  Diet: Cardiac  Pain/Anxiety/Delirium protocol (if indicated): As needed VAP protocol (if indicated): N/A DVT prophylaxis: hep gtt  GI prophylaxis: PPI Glucose control: Monitor  Mobility: Per  primary  Disposition: Per primary  Goals of Care:  Per primary   Labs   CBC: Recent Labs  Lab 02/07/21 1219 02/10/21 1156 02/11/21 0152 02/12/21 0306  WBC 6.5 7.4 7.9 8.0  HGB 13.1 11.2* 12.2* 12.0*  HCT 38.5* 34.1* 35.2* 33.6*  MCV 91.2 94.5 90.5 90.1  PLT 315 265 239 893    Basic Metabolic Panel: Recent Labs  Lab 02/07/21 1219 02/10/21 1156 02/10/21 1831 02/11/21 1146 02/12/21 0306  NA 126* 131*  --  129* 127*  K 3.7 3.5  --  3.5 3.5  CL 91* 98  --  95* 94*  CO2 26 23  --  25 25  GLUCOSE 92 119*  --  110* 120*  BUN 12 14  --  12 14  CREATININE 0.88 0.93  --  0.82 0.78  CALCIUM 9.1 8.0*  --  8.7* 8.3*  MG  --   --  1.9  --   --    GFR: Estimated Creatinine Clearance: 88.6 mL/min (by C-G formula based on SCr of 0.78 mg/dL). Recent Labs  Lab 02/07/21 1219 02/10/21 1156 02/11/21 0152 02/12/21 0306  WBC 6.5 7.4 7.9 8.0    Liver Function Tests: Recent Labs  Lab 02/10/21 1156  AST 19  ALT 12  ALKPHOS 36*  BILITOT 0.7  PROT 5.3*  ALBUMIN 2.8*   No results for input(s): LIPASE, AMYLASE in the last 168 hours. No results for input(s): AMMONIA in the last 168 hours.  ABG    Component Value Date/Time   TCO2 23 04/06/2009 1535     Coagulation Profile: No results for input(s): INR, PROTIME in the last 168 hours.  Cardiac Enzymes: No results for input(s): CKTOTAL, CKMB, CKMBINDEX, TROPONINI in the last 168 hours.  HbA1C: Hgb A1c  MFr Bld  Date/Time Value Ref Range Status  04/08/2009 03:33 AM  4.6 - 6.1 % Final   5.6 (NOTE) The ADA recommends the following therapeutic goal for glycemic control related to Hgb A1c measurement: Goal of therapy: <6.5 Hgb A1c  Reference: American Diabetes Association: Clinical Practice Recommendations 2010, Diabetes Care, 2010, 33: (Suppl  1).  04/07/2009 08:14 AM  4.6 - 6.1 % Final   5.7 (NOTE) The ADA recommends the following therapeutic goal for glycemic control related to Hgb A1c measurement: Goal of therapy: <6.5  Hgb A1c  Reference: American Diabetes Association: Clinical Practice Recommendations 2010, Diabetes Care, 2010, 33: (Suppl  1).    CBG: No results for input(s): GLUCAP in the last 168 hours.    Julian Hy, DO 02/12/21 4:21 PM [facility]  From 7AM- 7PM if no response to pager, please call [phone]. After hours, 7PM- 7AM, please call Elink  [phone].

## 2021-02-12 NOTE — Progress Notes (Signed)
Subdural drain removed. Two staples placed at insertion site. Site is clean, dry, and intact. Covered with gauze.

## 2021-02-13 ENCOUNTER — Telehealth: Payer: Self-pay | Admitting: Critical Care Medicine

## 2021-02-13 DIAGNOSIS — I2602 Saddle embolus of pulmonary artery with acute cor pulmonale: Secondary | ICD-10-CM | POA: Diagnosis not present

## 2021-02-13 DIAGNOSIS — I5022 Chronic systolic (congestive) heart failure: Secondary | ICD-10-CM | POA: Diagnosis not present

## 2021-02-13 LAB — CBC
HCT: 31.1 % — ABNORMAL LOW (ref 39.0–52.0)
Hemoglobin: 11.2 g/dL — ABNORMAL LOW (ref 13.0–17.0)
MCH: 32.6 pg (ref 26.0–34.0)
MCHC: 36 g/dL (ref 30.0–36.0)
MCV: 90.4 fL (ref 80.0–100.0)
Platelets: 210 10*3/uL (ref 150–400)
RBC: 3.44 MIL/uL — ABNORMAL LOW (ref 4.22–5.81)
RDW: 14.3 % (ref 11.5–15.5)
WBC: 9 10*3/uL (ref 4.0–10.5)
nRBC: 0 % (ref 0.0–0.2)

## 2021-02-13 MED ORDER — AMLODIPINE BESYLATE 2.5 MG PO TABS
2.5000 mg | ORAL_TABLET | Freq: Every day | ORAL | Status: DC
Start: 2021-02-13 — End: 2021-02-16
  Administered 2021-02-13 – 2021-02-16 (×4): 2.5 mg via ORAL
  Filled 2021-02-13 (×4): qty 1

## 2021-02-13 NOTE — Progress Notes (Signed)
Overall doing well.  Denies headache.  He is awake and alert.  His motor examination is intact.  No cranial nerve deficits.  Wound clean and dry.  No chest pain.  No shortness of breath.  Tolerating heparin well.  Overall doing remarkably well following pulmonary embolus and prior cranial surgery.  Continue observation and supportive care.  Patient okay to transition to oral medications for treatment of his DVT/PE.

## 2021-02-13 NOTE — Progress Notes (Signed)
Physical Therapy Treatment Patient Details Name: John Mack MRN: [id number] DOB: 16-Aug-1941 Today's Date: 02/13/2021    History of Present Illness 80 yo male admitted to [facility] on 2/28, s/p R burr holes and drain placement for chronic R SDH. CTH on 3/2 shows decompressed subdural hemorrhage on the right without complicating features; midline shift has diminished to 4 mm. PMH includes anxiety, skin cancer, HF, dilated cardiomyopathy, depression, HLD, HTN, TIAs, arthroscopies L shoulder and B knees.    PT Comments    Patient seen for activity progression and mobility improvements. Patient tolerated session well but continues to demonstrate limitations in gait and stability. Reliance on RW for support with max multimodal cues for stride and cadence. Current POC remains appropriate. Will continue to see as indicated and progress as tolerated.   Follow Up Recommendations  Home health PT;Supervision for mobility/OOB (sister-in-law, children, or pt's wife's assist Leanne to help?)     Equipment Recommendations  None recommended by PT    Recommendations for Other Services       Precautions / Restrictions Precautions Precautions: Fall Precaution Comments: drain from burr holes, no need to clamp    Mobility  Bed Mobility Overal bed mobility: Needs Assistance Bed Mobility: Supine to Sit     Supine to sit: Supervision;HOB elevated     General bed mobility comments: Supervision for safety    Transfers Overall transfer level: Needs assistance Equipment used: Rolling walker (2 wheeled) Transfers: Sit to/from Stand Sit to Stand: Min guard         General transfer comment: Min guard A for safety  Ambulation/Gait Ambulation/Gait assistance: Min assist Gait Distance (Feet): 120 Feet Assistive device: Rolling walker (2 wheeled) Gait Pattern/deviations: Decreased stride length;Step-through pattern;Shuffle;Drifts right/left Gait velocity: significantly decreased Gait velocity  interpretation: <1.31 ft/sec, indicative of household ambulator General Gait Details: patient with incredibly slow self pacing and decreased shuffling strides. Max multi modal cues for increased step length and cadence. Multiple standing rest breaks to ensure VSS. Patient encouraged to maintain upright posture and positioning within RW. hands on assist required thorughout for safety and stability   Stairs             Wheelchair Mobility    Modified Rankin (Stroke Patients Only)       Balance Overall balance assessment: Needs assistance Sitting-balance support: No upper extremity supported;Feet supported Sitting balance-[name]: Fair     Standing balance support: No upper extremity supported;During functional activity Standing balance-[name]: Fair Standing balance comment: Reliance on RW for BUE support                            Cognition Arousal/Alertness: Awake/alert Behavior During Therapy: WFL for tasks assessed/performed Overall Cognitive Status: Impaired/Different from baseline Area of Impairment: Memory;Following commands                     Memory: Decreased short-term memory Following Commands: Follows one step commands with increased time       General Comments: Multi modal cues thorughout session with decreased carry over at times      Exercises      General Comments General comments (skin integrity, edema, etc.): VSS throughout session with multiple rest breaks      Pertinent Vitals/Pain Faces Pain Scale: No hurt    Home Living                      Prior Function  PT Goals (current goals can now be found in the care plan section) Acute Rehab PT Goals Patient Stated Goal: go home PT Goal Formulation: With patient Time For Goal Achievement: 02/23/21 Potential to Achieve Goals: Good Progress towards PT goals: Progressing toward goals    Frequency    Min 4X/week      PT Plan Current  plan remains appropriate    Co-evaluation              AM-PAC PT "6 Clicks" Mobility   Outcome Measure  Help needed turning from your back to your side while in a flat bed without using bedrails?: A Little Help needed moving from lying on your back to sitting on the side of a flat bed without using bedrails?: A Little Help needed moving to and from a bed to a chair (including a wheelchair)?: A Little Help needed standing up from a chair using your arms (e.g., wheelchair or bedside chair)?: A Little Help needed to walk in hospital room?: A Little Help needed climbing 3-5 steps with a railing? : A Little 6 Click Score: 18    End of Session Equipment Utilized During Treatment: Gait belt Activity Tolerance: Patient tolerated treatment well Patient left: in chair;with call bell/phone within reach;with chair alarm set;with nursing/sitter in room Nurse Communication: Mobility status PT Visit Diagnosis: Other abnormalities of gait and mobility (R26.89);History of falling (Z91.81)     Time: 1007-1219 PT Time Calculation (min) (ACUTE ONLY): 21 min  Charges:  $Gait Training: 8-22 mins                     Alben Deeds, PT DPT  Board Certified Neurologic Specialist [place] [facility] [place] 02/13/2021, 1:44 PM

## 2021-02-13 NOTE — Telephone Encounter (Signed)
Mr. Langlois needs Pulmonary follow up to establish care with Dr. Silas Flood in about 1 month.  Julian Hy, DO 02/13/21 1:16 PM [place]

## 2021-02-13 NOTE — Progress Notes (Signed)
NAME:  John Mack, MRN:  [id number], DOB:  Aug 25, 1941, LOS: 6 ADMISSION DATE:  02/07/2021, CONSULTATION DATE:  02/10/2021 REFERRING MD:  Dr. Christella Noa, CHIEF COMPLAINT:  Presyncope   Brief History:  Patient presented for elective surgical correction of chronic subdural hematoma. PCCM consulted afternoon of 3/3 for symptomatic presyncopal episode.Marland Kitchen  History of Present Illness:  John Mack is a 80 year old male with a past medical history significant for hypothyroidism, hypertension, hyperlipidemia, frequent PVCs, dysrhythmia, dilated cardiomyopathy, skin cancer, and prior TIAs who presented fo for elective evacuation of chronic subdural hematoma by neurosurgery.  Patient suffered mechanical fall December 2021 that resulted in right frontal subdural hematoma and subarachnoid hemorrhage.  Patient initially refused transfer to emergency department follow-up with primary care physician day after a fall at which time CT scan revealed SDH and SAH.  Neurosurgery at that time recommended repeat head CT which was completed 2/22 that revealed significant progression of subdural hematoma.  Patient was advised to present to the emergency department at that elective instead to follow-up with neurosurgery in the outpatient setting.  Patient presented and underwent placement of right bur holes for subdural hematoma evacuation with Dr. Christella Noa 2/28.  Initially patient tolerated procedure well with no postoperative complications.  On 3/3 while ambulating with physical therapy patient was seen with symptomatic presyncope.  He was seen minimally responsive, hypoxic, and diaphoretic.  PCCM was called urgently to bedside for possible assistance with airway.  On arrival patient was seen minimally responsive hypoxic with SPO2 of 78, cyanotic, and with snoring respirations.  Ambu bag was placed but no breaths given and patient quickly became more responsive.  Review of telemetry strip at time of episode revealed very  frequent PVCs almost every other beat.  Of note patient sees cardiologist at [facility] for difficult to treat PVCs and he was placed on p.o. amiodarone.  The cardiologist recommended ambulation but due to subdural hematoma the cardiology team there decided to treat medically.  Past Medical History:  Hypothyroidism, hypertension, hyperlipidemia, frequent PVCs, dysrhythmia, dilated cardiomyopathy, skin cancer, and prior TIAs  Significant Hospital Events:  Admitted for elective SDH evacutation  3/3> submassive PE, responded to IVF, treated with heparin gtt 3/5> transitioned to apixaban  Consults:  PCCM Cardiology  Procedures:  Right bur holes for evacuation of subdural hematoma 2/28  Significant Diagnostic Tests:  Head CT 3/2 > decompressed subdural hematoma on the right without complicating features, decreased midline shift  Micro Data:  MRSA PCR 2/28 > negative  Antimicrobials:  Cefazolin 2/28>   Interim History / Subjective:  John Mack denies complaints today. He walked further and faster today with PT without dyspnea.  Objective   Blood pressure 124/74, pulse 76, temperature 99.8 F (37.7 C), temperature source Oral, resp. rate 16, height 5\' 10"  (1.778 m), weight 93 kg, SpO2 95 %.        Intake/Output Summary (Last 24 hours) at 02/13/2021 1319 Last data filed at 02/13/2021 0607 Gross per 24 hour  Intake 708.29 ml  Output 2650 ml  Net -1941.71 ml   Filed Weights   02/07/21 1228  Weight: 93 kg    Examination: General: elderly man sitting up in the chair in NAD HEENT: staple line without erythema or bleeding Neuro: awake, alert, answering questions appropriately, moving all extremities CV: S1S2, RRR PULM: breathing comfortably on RA, CTAB, no conversational dyspnea GI: obese, soft, Nt, ND Extremities: no c/c/e Skin: warm & dry, no rashes  Resolved Hospital Problem list     Assessment &  Plan:   Chronic SDH-- s/p burr holes. Drain removed on 3/5. -Dr.  Christella Noa P: -Appreciate NS's input.  Acute submassive pulmonary embolism w/ presyncopal event  P: -Doing well with AC so far. Transitioned to apixaban on 3/5. Con't to monitor for neuro changes, but so far he is doing well. Planning on 3 months for provoked DVT. Follow up with [facility] has been requested. -Okay to start resuming cardiac meds; monitor blood pressure closely  Hypotension, resolved -normotensive 3/4 but in review of flowsheet looks like intermittently hypotensive overnight.  P: -Ok to resume amlodipine today.   -Con't metoprolol XL BID at PTA dose. -Cardiac monitoring BP  Chronic HFrEF Frequent PVCs -Patient is followed by [facility] cardiology with last office visit 01/19/2021. -Primary cardiologist at [facility] recommended ablation for frequent PVCs but due to subdural hematoma decision was made to treat medically with amiodarone and beta-blocker due to inability to utilize anticoagulants. P: -Remains on PTA amiodarone -Con't PTA metoprolol XL 25 mg twice daily.  Continue to monitor blood pressure. --Add PTA amlodipine today. -Telemetry monitoring   Hypothyroidism -Home medications include Synthroid P: -Continue synthroid   Hyponatremia, mild-potentially due to chronic heart failure P: -Continue to monitor -Monitor I/O, avoid hypervolemia  Acute anemia, likely due to acute illness -Transfuse hemoglobin less than 7 or hemodynamically significant bleeding -Monitor for signs of bleeding on anticoagulation  Constipation-resolved P: -Continue bowel regimen  PCCM will sign off. Please call with questions. OP follow up has been requested and he will receive a call this week to schedule an appointment.  Best practice (evaluated daily)  Diet: Cardiac  Pain/Anxiety/Delirium protocol (if indicated): As needed VAP protocol (if indicated): N/A DVT prophylaxis: hep gtt  GI prophylaxis: PPI Glucose control: Monitor  Mobility: Per primary  Disposition: Per  primary  Goals of Care:  Per primary   Labs   CBC: Recent Labs  Lab 02/07/21 1219 02/10/21 1156 02/11/21 0152 02/12/21 0306 02/13/21 0215  WBC 6.5 7.4 7.9 8.0 9.0  HGB 13.1 11.2* 12.2* 12.0* 11.2*  HCT 38.5* 34.1* 35.2* 33.6* 31.1*  MCV 91.2 94.5 90.5 90.1 90.4  PLT 315 265 239 236 025    Basic Metabolic Panel: Recent Labs  Lab 02/07/21 1219 02/10/21 1156 02/10/21 1831 02/11/21 1146 02/12/21 0306  NA 126* 131*  --  129* 127*  K 3.7 3.5  --  3.5 3.5  CL 91* 98  --  95* 94*  CO2 26 23  --  25 25  GLUCOSE 92 119*  --  110* 120*  BUN 12 14  --  12 14  CREATININE 0.88 0.93  --  0.82 0.78  CALCIUM 9.1 8.0*  --  8.7* 8.3*  MG  --   --  1.9  --   --    GFR: Estimated Creatinine Clearance: 88.6 mL/min (by C-G formula based on SCr of 0.78 mg/dL). Recent Labs  Lab 02/10/21 1156 02/11/21 0152 02/12/21 0306 02/13/21 0215  WBC 7.4 7.9 8.0 9.0    Liver Function Tests: Recent Labs  Lab 02/10/21 1156  AST 19  ALT 12  ALKPHOS 36*  BILITOT 0.7  PROT 5.3*  ALBUMIN 2.8*   No results for input(s): LIPASE, AMYLASE in the last 168 hours. No results for input(s): AMMONIA in the last 168 hours.  ABG    Component Value Date/Time   TCO2 23 04/06/2009 1535     Coagulation Profile: No results for input(s): INR, PROTIME in the last 168 hours.  Cardiac Enzymes: No  results for input(s): CKTOTAL, CKMB, CKMBINDEX, TROPONINI in the last 168 hours.  HbA1C: Hgb A1c MFr Bld  Date/Time Value Ref Range Status  04/08/2009 03:33 AM  4.6 - 6.1 % Final   5.6 (NOTE) The ADA recommends the following therapeutic goal for glycemic control related to Hgb A1c measurement: Goal of therapy: <6.5 Hgb A1c  Reference: American Diabetes Association: Clinical Practice Recommendations 2010, Diabetes Care, 2010, 33: (Suppl  1).  04/07/2009 08:14 AM  4.6 - 6.1 % Final   5.7 (NOTE) The ADA recommends the following therapeutic goal for glycemic control related to Hgb A1c measurement: Goal of  therapy: <6.5 Hgb A1c  Reference: American Diabetes Association: Clinical Practice Recommendations 2010, Diabetes Care, 2010, 33: (Suppl  1).    CBG: No results for input(s): GLUCAP in the last 168 hours.    Julian Hy, DO 02/13/21 2:15 PM [place] [facility]  From 7AM- 7PM if no response to pager, please call [phone]. After hours, 7PM- 7AM, please call [place]  [phone].

## 2021-02-14 DIAGNOSIS — S065X9A Traumatic subdural hemorrhage with loss of consciousness of unspecified duration, initial encounter: Secondary | ICD-10-CM | POA: Diagnosis not present

## 2021-02-14 LAB — CBC
HCT: 32.4 % — ABNORMAL LOW (ref 39.0–52.0)
Hemoglobin: 11.2 g/dL — ABNORMAL LOW (ref 13.0–17.0)
MCH: 31.5 pg (ref 26.0–34.0)
MCHC: 34.6 g/dL (ref 30.0–36.0)
MCV: 91 fL (ref 80.0–100.0)
Platelets: 223 10*3/uL (ref 150–400)
RBC: 3.56 MIL/uL — ABNORMAL LOW (ref 4.22–5.81)
RDW: 14 % (ref 11.5–15.5)
WBC: 7.8 10*3/uL (ref 4.0–10.5)
nRBC: 0 % (ref 0.0–0.2)

## 2021-02-14 LAB — SODIUM: Sodium: 125 mmol/L — ABNORMAL LOW (ref 135–145)

## 2021-02-14 MED ORDER — SODIUM CHLORIDE 1 G PO TABS
2.0000 g | ORAL_TABLET | Freq: Two times a day (BID) | ORAL | Status: DC
  Administered 2021-02-14 – 2021-02-16 (×5): 2 g via ORAL
  Filled 2021-02-14 (×5): qty 2

## 2021-02-14 NOTE — TOC Benefit Eligibility Note (Signed)
Transition of Care [facility] Benefit Eligibility Note    Patient Details  Name: John Mack MRN: [id number] Date of Birth: 06/07/1941   Medication/Dose: Eliquis 5mg . bid 30 day supply  Covered?: Yes  Tier: 3 Drug  Prescription Coverage Preferred Pharmacy: [facility]  Spoke with Person/Company/Phone Number:: Kaye P. W/[employer] PH# [phone]  Co-Pay: $47.00  Prior Approval: No  Deductible: Met       Shelda Altes Phone Number: 02/14/2021, 9:40 AM

## 2021-02-14 NOTE — Progress Notes (Signed)
Patient ID: John Mack, male   DOB: [birth year], 80 y.o.   MRN: [id number] BP 121/75 (BP Location: Right Arm)   Pulse 97   Temp 97.8 F (36.6 C) (Oral)   Resp 20   Ht 5\' 10"  (1.778 m)   Wt 96.7 kg   SpO2 95%   BMI 30.59 kg/m  Alert and oriented x 4, speech is clear and fluent Moving all extremities well Wounds are clean,dry, no signs of infection Na low, given NaCl tablets 2gm bid Possible transfer to rehab tomorrow

## 2021-02-14 NOTE — Progress Notes (Signed)
Occupational Therapy Treatment Patient Details Name: John Mack MRN: [id number] DOB: November 18, 1941 Today's Date: 02/14/2021    History of present illness Pt is 80 yo male admitted to [facility] on 2/28, s/p R burr holes and drain placement for chronic R SDH. CTH on 3/2 shows decompressed subdural hemorrhage on the right without complicating features; midline shift has diminished to 4 mm. On 3/3 pt had episode of pre-syncope with PT and decreasing O2 sats with rapid response called. Pt was then found to have submassive PE and started on Heparin. PMH includes anxiety, skin cancer, HF, dilated cardiomyopathy, depression, HLD, HTN, TIAs, arthroscopies L shoulder and B knees.   OT comments  Pt continuing to present with decreased balance, strength, and cognition compared to prior to medical event last week. Pt currently requiring Min A for standing balance during bathing at sink, grooming, and functional mobility with RW. Pt also presenting with decreased awareness, safety, memory, and perseverating on certain topics. With these changes, pt's safety and functional performance are impacted. Due to pt's high motivation and independence PTA, update dc recommendation to CIR for intensive OT to optimize safety, independence with ADLs, and return to PLOF.    Follow Up Recommendations  CIR    Equipment Recommendations  3 in 1 bedside commode;Other (comment) (RW)    Recommendations for Other Services PT consult    Precautions / Restrictions Precautions Precautions: Fall       Mobility Bed Mobility Overal bed mobility: Needs Assistance Bed Mobility: Sit to Supine     Supine to sit: Min assist;HOB elevated Sit to supine: Min guard   General bed mobility comments: Min Guard A for safety    Transfers Overall transfer level: Needs assistance Equipment used: Rolling walker (2 wheeled) Transfers: Sit to/from Stand Sit to Stand: Min assist;Min guard         General transfer comment: Min Guard A  for safety into standing and then Min A for safe descent    Balance Overall balance assessment: Needs assistance Sitting-balance support: Feet supported;Bilateral upper extremity supported;No upper extremity supported Sitting balance-[name]: Fair     Standing balance support: Bilateral upper extremity supported Standing balance-[name]: Poor Standing balance comment: Reliance on RW for BUE support                           ADL either performed or assessed with clinical judgement   ADL Overall ADL's : Needs assistance/impaired         Upper Body Bathing: Minimal assistance;Standing   Lower Body Bathing: Minimal assistance;Sit to/from Technical brewer: Ambulation;RW;Minimal assistance (simulated to recliner) Armed forces technical officer Details (indicate cue type and reason): Min A for safe descent         Functional mobility during ADLs: Minimal assistance;Rolling walker General ADL Comments: Pt requesting to take a sponge bath at sink. Requiring Min A throughout for decreased balance. Pt also decreased cognition and awareness compared to prior sessions.     Vision       Perception     Praxis      Cognition Arousal/Alertness: Awake/alert Behavior During Therapy: WFL for tasks assessed/performed Overall Cognitive Status: Impaired/Different from baseline Area of Impairment: Memory;Following commands;Safety/judgement                     Memory: Decreased short-term memory Following Commands: Follows one step commands with increased time Safety/Judgement: Decreased awareness  of safety;Decreased awareness of deficits     General Comments: Pt perseverating on certain topics (for example, pt repeating that he would like his head shaved throughout session despite cues and conversation that herapist with talk with RN). Pt with poor awarneess of deficits and change in functional status        Exercises     Shoulder Instructions        General Comments HR 110s.    Pertinent Vitals/ Pain       Pain Assessment: No/denies pain  Home Living                                          Prior Functioning/Environment              Frequency  Min 2X/week        Progress Toward Goals  OT Goals(current goals can now be found in the care plan section)  Progress towards OT goals: Not progressing toward goals - comment  Acute Rehab OT Goals Patient Stated Goal: go home OT Goal Formulation: With patient Time For Goal Achievement: 02/24/21 Potential to Achieve Goals: Good ADL Goals Pt Will Perform Grooming: with modified independence;standing Pt Will Perform Lower Body Dressing: with modified independence;sitting/lateral leans;sit to/from stand Pt Will Transfer to Toilet: with modified independence;ambulating;regular height toilet Pt Will Perform Toileting - Clothing Manipulation and hygiene: with modified independence;sitting/lateral leans;sit to/from stand Pt Will Perform Tub/Shower Transfer: Shower transfer;3 in 1;ambulating;with supervision  Plan Discharge plan needs to be updated    Co-evaluation                 AM-PAC OT "6 Clicks" Daily Activity     Outcome Measure   Help from another person eating meals?: None Help from another person taking care of personal grooming?: A Little Help from another person toileting, which includes using toliet, bedpan, or urinal?: A Little Help from another person bathing (including washing, rinsing, drying)?: A Little Help from another person to put on and taking off regular upper body clothing?: A Little Help from another person to put on and taking off regular lower body clothing?: A Little 6 Click Score: 19    End of Session Equipment Utilized During Treatment: Rolling walker  OT Visit Diagnosis: Unsteadiness on feet (R26.81);Other abnormalities of gait and mobility (R26.89);Muscle weakness (generalized) (M62.81)   Activity Tolerance  Patient tolerated treatment well   Patient Left with call bell/phone within reach;in bed;with bed alarm set   Nurse Communication Mobility status        Time: 8889-1694 OT Time Calculation (min): 16 min  Charges: OT General Charges $OT Visit: 1 Visit OT Treatments $Self Care/Home Management : 8-22 mins  [place], OTR/L Acute Rehab Pager: [phone] Office: [place] 02/14/2021, 3:56 PM

## 2021-02-14 NOTE — Progress Notes (Addendum)
Pt's last Na was 127 on 3/5 with no follow-up. PCCM notes show that pt being monitored for hyponatremia and avoiding hypovolemia. Pt's diet is carb mod with no fluid restriction. This RN called [facility] to request a recheck from Dr. Christella Noa. Verbal order from Dr. Christella Noa to check pt's sodium level. Pt seems mildly confused, but is A&Ox4 with no memory impairment.  1430: Pt's Na 125. Message left with Junie Panning at [facility] to give Dr. Christella Noa.   1500: Verbal order given from Dr. Christella Noa for 2g Sodium tablets BID.  Justice Rocher, RN

## 2021-02-14 NOTE — Progress Notes (Signed)
Inpatient Rehab Admissions Coordinator Note:   Per therapy recommendations, pt was screened for CIR candidacy by Clemens Catholic, [place] CCC-SLP. At this time, Pt. Appears to have functional decline and is a good candidate for CIR. Will place order for rehab consult.  Please contact me with questions.   Clemens Catholic, [place], [place] Admissions Coordinator  [phone] ([place]) [phone] (office)

## 2021-02-14 NOTE — Telephone Encounter (Signed)
appt has been scheduled for pt with Dr. Silas Flood as new pt appt on 4/7. With pt currently in the hospital, once pt is discharged appt will show up on AVS. If pt needs to change appt, pt can call office to get appt changed.  Nothing further needed.

## 2021-02-14 NOTE — Progress Notes (Signed)
Physical Therapy Treatment Patient Details Name: John Mack MRN: [id number] DOB: 1940/12/18 Today's Date: 02/14/2021    History of Present Illness Pt is 80 yo male admitted to [facility] on 2/28, s/p R burr holes and drain placement for chronic R SDH. CTH on 3/2 shows decompressed subdural hemorrhage on the right without complicating features; midline shift has diminished to 4 mm. On 3/3 pt had episode of pre-syncope with PT and decreasing O2 sats with rapid response called. Pt was then found to have submassive PE and started on Heparin. PMH includes anxiety, skin cancer, HF, dilated cardiomyopathy, depression, HLD, HTN, TIAs, arthroscopies L shoulder and B knees.    PT Comments    Pt is making gradual progress;  However, has not progressed to level that he was prior to PE on 02/10/21.  Pt with shuffle gait and decreased safety awareness.  Requiring min A for balance.  Pt resides with his elderly wife who is bed bound, she has a caregiver during the day.  Pt was very independent and active (exercised, transferred wife, etc) at baseline.  Updated recommendation to CIR.    Follow Up Recommendations  CIR     Equipment Recommendations  None recommended by PT    Recommendations for Other Services Rehab consult     Precautions / Restrictions Precautions Precautions: Fall    Mobility  Bed Mobility Overal bed mobility: Needs Assistance Bed Mobility: Supine to Sit     Supine to sit: Min assist;HOB elevated     General bed mobility comments: Min A to scoot forward; increased time    Transfers Overall transfer level: Needs assistance Equipment used: Rolling walker (2 wheeled) Transfers: Sit to/from Stand Sit to Stand: Min assist         General transfer comment: Performed x 2; required cues for safe hand placement; min A to steady  Ambulation/Gait Ambulation/Gait assistance: Min assist Gait Distance (Feet): 100 Feet Assistive device: Rolling walker (2 wheeled) Gait  Pattern/deviations: Decreased stride length;Shuffle;Step-to pattern;Decreased stance time - right Gait velocity: significantly decreased   General Gait Details: Step to R gait with mild instability requiring min A to steady.  Frequent cues for larger steps and RW proximity.  When cued for increased step length he would take 2-3 but then resort back to shuffle   Stairs             Wheelchair Mobility    Modified Rankin (Stroke Patients Only)       Balance Overall balance assessment: Needs assistance Sitting-balance support: Feet supported;Bilateral upper extremity supported;No upper extremity supported Sitting balance-[name]: Fair     Standing balance support: Bilateral upper extremity supported Standing balance-[name]: Poor Standing balance comment: Reliance on RW for BUE support                            Cognition Arousal/Alertness: Awake/alert Behavior During Therapy: WFL for tasks assessed/performed Overall Cognitive Status: Impaired/Different from baseline Area of Impairment: Memory;Following commands;Safety/judgement                     Memory: Decreased short-term memory Following Commands: Follows one step commands with increased time Safety/Judgement: Decreased awareness of safety;Decreased awareness of deficits            Exercises      General Comments General comments (skin integrity, edema, etc.): HR 80-100 bpm; O2 sats 90% and with with DOE of 3/4 with activity  Pertinent Vitals/Pain Pain Assessment: No/denies pain    Home Living                      Prior Function            PT Goals (current goals can now be found in the care plan section) Acute Rehab PT Goals Patient Stated Goal: go home PT Goal Formulation: With patient Time For Goal Achievement: 02/23/21 Potential to Achieve Goals: Good Progress towards PT goals: Progressing toward goals (gradual progress)    Frequency    Min  4X/week      PT Plan Discharge plan needs to be updated    Co-evaluation              AM-PAC PT "6 Clicks" Mobility   Outcome Measure  Help needed turning from your back to your side while in a flat bed without using bedrails?: A Little Help needed moving from lying on your back to sitting on the side of a flat bed without using bedrails?: A Little Help needed moving to and from a bed to a chair (including a wheelchair)?: A Little Help needed standing up from a chair using your arms (e.g., wheelchair or bedside chair)?: A Little Help needed to walk in hospital room?: A Little Help needed climbing 3-5 steps with a railing? : A Lot 6 Click Score: 17    End of Session Equipment Utilized During Treatment: Gait belt Activity Tolerance: Patient tolerated treatment well Patient left: in chair;with call bell/phone within reach;with chair alarm set Nurse Communication: Mobility status PT Visit Diagnosis: Other abnormalities of gait and mobility (R26.89);History of falling (Z91.81)     Time: 3419-6222 PT Time Calculation (min) (ACUTE ONLY): 26 min  Charges:  $Gait Training: 8-22 mins $Therapeutic Activity: 8-22 mins                     Abran Richard, PT Acute Rehab Services Pager [phone] [facility] 02/14/2021, 12:32 PM

## 2021-02-14 NOTE — Progress Notes (Signed)
Inpatient Rehab Admissions Coordinator:   Met with patient at bedside to discuss potential CIR admission. Pt. Stated interest. Will pursue for potential admit this week, pending bed availability.  Clemens Catholic, [place], [place] Admissions Coordinator  [phone] ([place]) [phone] (office)

## 2021-02-14 NOTE — Consult Note (Signed)
Physical Medicine and Rehabilitation Consult Reason for Consult: Persistent headache with decreased functional mobility Referring Physician: Dr. Christella Noa   HPI: John Mack is a 80 y.o. right-handed male with history of diastolic congestive heart failure, dilated cardiomyopathy ejection fraction 45% followed at [facility], hypertension, SDH after fall 11/30/2020.  Per chart review patient lives with spouse.  Two-level home bed and bath main level 2 steps to entry.  Poorly independent prior to admission caring for his wheelchair-bound wife.  Presented 02/07/2021 with persistent headache decreased functional mobility.  X-rays and imaging revealed right panhemispheric chronic subdural hematoma.  Underwent right bur hole for subdural hematoma 02/07/2021 per Dr. Christella Noa.  Maintained on Keppra for seizure prophylaxis.  Hospital course episode presyncope associated PVCs identified on telemetry.  Noted blood pressure 60 over 40s received IV fluid bolus.  Echocardiogram showed moderately depressed RV systolic function with ejection fraction 50 to 55% with cardiology service follow-up.  Currently maintained on amiodarone.  CTA of the chest obtained revealed acute submassive PE with RV/LV of 1.91 and lower extremity Dopplers were negative.  Patient was cleared to begin Eliquis.  Tolerating a regular diet.  Therapy evaluations completed due to patient decreased functional mobility recommendations of physical medicine rehab consult. He was an avid runner at home- all kinds of distances. Cannot recall why he fell.    Review of Systems  Constitutional: Negative for chills and fever.  HENT: Negative for hearing loss.   Eyes: Negative for blurred vision and double vision.  Respiratory: Negative for cough.        Occasional shortness of breath with exertion  Cardiovascular: Negative for chest pain, palpitations and leg swelling.  Gastrointestinal: Positive for constipation. Negative for heartburn,  nausea and vomiting.       GERD  Genitourinary: Negative for dysuria, flank pain and hematuria.  Musculoskeletal: Positive for joint pain and myalgias.  Skin: Negative for rash.  Neurological: Positive for dizziness, weakness and headaches.  Psychiatric/Behavioral: Positive for depression. The patient has insomnia.   All other systems reviewed and are negative.  Past Medical History:  Diagnosis Date  . Allergy   . Anxiety   . Arthritis   . Asthma   . Cancer ([place])    skin  . CHF (congestive heart failure) (HCC)    chornic systolic CHF  . DCM (dilated cardiomyopathy) ([place]) 04/18/2017   ED 45% by echo at [facility]  . Depression   . Diverticulosis   . Dysrhythmia   . Eosinophilic esophagitis 5/63/8756   EGD 04/2014 17 Eos/hpf esophageal bxs  . Eye abnormalities    calcium deposits behind eyes  . Frequent PVCs   . GERD (gastroesophageal reflux disease)   . Hx of colonic polyps 12/18/2011  . Hyperlipidemia   . Hypertension   . Hypothyroidism   . SDH (subdural hematoma) ([place]) 11/30/2020  . Stroke [facility])    2 TIAs  . Thyroid disease    hypothyroid   Past Surgical History:  Procedure Laterality Date  . APPENDECTOMY    . BURR HOLE Right 02/07/2021   Procedure: Right Burr holes for subdural hematoma;  Surgeon: Ashok Pall, MD;  Location: [place];  Service: Neurosurgery;  Laterality: Right;  Right Burr holes for subdural hematoma  . COLONOSCOPY  10/02/2006, 12/18/2011  . ESOPHAGOSCOPY  12/18/2011   with Maloney dilation  . KNEE ARTHROSCOPY     both knees  . SHOULDER ARTHROSCOPY     left shoulder, both knees  . SHOULDER SURGERY  right surgery  . TONSILLECTOMY    . UPPER GASTROINTESTINAL ENDOSCOPY  02/04/2009   with Venia Minks dilation   Family History  Problem Relation Age of Onset  . Mesothelioma Paternal Aunt   . Mesothelioma Paternal Uncle   . Colon cancer Neg Hx   . Esophageal cancer Neg Hx   . Stomach cancer Neg Hx   . Rectal cancer Neg Hx    Social History:  reports that he  has never smoked. He has never used smokeless tobacco. He reports that he does not drink alcohol and does not use drugs. Allergies: No Known Allergies Medications Prior to Admission  Medication Sig Dispense Refill  . Albuterol Sulfate, sensor, (PROAIR DIGIHALER) 108 (90 Base) MCG/ACT AEPB Inhale 1 puff into the lungs every 6 (six) hours as needed (shortness of breath).    Marland Kitchen alprazolam (XANAX) 2 MG tablet Take 2 mg by mouth at bedtime.    Marland Kitchen amiodarone (PACERONE) 200 MG tablet Take 200 mg by mouth daily.    Marland Kitchen amLODipine (NORVASC) 2.5 MG tablet Take 2.5 mg by mouth daily.    . cyanocobalamin (,VITAMIN B-12,) 1000 MCG/ML injection Inject 1,000 mcg into the muscle See admin instructions. Inject 1 mL (1017mcg) intramuscularly every 30 days as needed for vitamin B12/energy.    Marland Kitchen EPINEPHrine 0.3 mg/0.3 mL IJ SOAJ injection Inject 0.3 mg into the muscle as needed for anaphylaxis.    Marland Kitchen esomeprazole (NEXIUM) 40 MG capsule Take 40 mg by mouth 2 (two) times daily.    . hydrochlorothiazide (HYDRODIURIL) 25 MG tablet Take 25 mg by mouth daily.    Marland Kitchen levothyroxine (SYNTHROID) 112 MCG tablet Take 112 mcg by mouth every morning.    Marland Kitchen LORazepam (ATIVAN) 1 MG tablet Take 0.5-1 mg by mouth See admin instructions. Take 1 tablet (1mg ) by mouth twice daily and 1/2 tablet (0.5mg ) by mouth after lunch.    . metoprolol succinate (TOPROL-XL) 25 MG 24 hr tablet Take 25 mg by mouth 2 (two) times daily.    . Multiple Vitamins-Minerals (MULTIVITAMIN PO) Take 1 tablet by mouth daily.     . pantoprazole (PROTONIX) 40 MG tablet Take 1 tablet by mouth 2 (two) times daily.    Vladimir Faster Glycol-Propyl Glycol (SYSTANE OP) Place 1 drop into both eyes daily as needed (dry eyes).    . pravastatin (PRAVACHOL) 20 MG tablet Take 20 mg by mouth daily.    . SYMBICORT 160-4.5 MCG/ACT inhaler Inhale 2 puffs into the lungs 2 (two) times daily as needed for shortness of breath.    . telmisartan (MICARDIS) 80 MG tablet Take 80 mg by mouth daily.     Marland Kitchen testosterone cypionate (DEPOTESTOTERONE CYPIONATE) 100 MG/ML injection Inject 200 mg into the muscle every 14 (fourteen) days. For IM use only    . vardenafil (LEVITRA) 20 MG tablet Take 20 mg by mouth daily as needed for erectile dysfunction.      Home: Home Living Family/patient expects to be discharged to:: Private residence Living Arrangements: Spouse/significant other Available Help at Discharge: Family,Available 24 hours/day Type of Home: House Home Access: Stairs to enter,Ramped entrance Entrance Stairs-Number of Steps: 2 (front), 3 (carport) Home Layout: Able to live on main level with bedroom/bathroom Bathroom Shower/Tub: Walk-in shower (lip to step over) Biochemist, clinical: Handicapped height Home Equipment: Other (comment),Grab bars - tub/shower,Grab bars - toilet Additional Comments: hoyer lift for wife (does not use)  Functional History: Prior Function Level of Independence: Independent Comments: fully independence, caring for his w/c level wife (manually  lifts pt in and out of w/c) - wife may d/c to ST-SNF while he is recovering Functional Status:  Mobility: Bed Mobility Overal bed mobility: Needs Assistance Bed Mobility: Supine to Sit Supine to sit: Min assist,HOB elevated Sit to supine: Total assist,+2 for physical assistance General bed mobility comments: Min A to scoot forward; increased time Transfers Overall transfer level: Needs assistance Equipment used: Rolling walker (2 wheeled) Transfers: Sit to/from Stand Sit to Stand: Min assist General transfer comment: Performed x 2; required cues for safe hand placement; min A to steady Ambulation/Gait Ambulation/Gait assistance: Min assist Gait Distance (Feet): 100 Feet Assistive device: Rolling walker (2 wheeled) Gait Pattern/deviations: Decreased stride length,Shuffle,Step-to pattern,Decreased stance time - right General Gait Details: Step to R gait with mild instability requiring min A to steady.  Frequent  cues for larger steps and RW proximity.  When cued for increased step length he would take 2-3 but then resort back to shuffle Gait velocity: significantly decreased Gait velocity interpretation: <1.31 ft/sec, indicative of household ambulator    ADL: ADL Overall ADL's : Needs assistance/impaired Eating/Feeding: Supervision/ safety,Set up,Sitting Grooming: Minimal assistance,Standing Upper Body Bathing: Supervision/ safety,Set up,Sitting Lower Body Bathing: Minimal assistance,Sit to/from stand Upper Body Dressing : Supervision/safety,Set up,Sitting Lower Body Dressing: Moderate assistance,Sit to/from stand Toilet Transfer: Min guard,Ambulation,RW (simulated to recliner) Armed forces technical officer Details (indicate cue type and reason): Min Guard A for safety Functional mobility during ADLs: Min guard,Rolling walker General ADL Comments: Pt performing mobility with Min Guard A for safety. Reporting feeling uncertain since medical event on Thursday. Pt demonstrating decreased strength and balance but nothing significant compared to PT eval and OT eval. Pt did present this session with memory deficits that were not noted on Thursday.  Cognition: Cognition Overall Cognitive Status: Impaired/Different from baseline Orientation Level: Oriented X4 Cognition Arousal/Alertness: Awake/alert Behavior During Therapy: WFL for tasks assessed/performed Overall Cognitive Status: Impaired/Different from baseline Area of Impairment: Memory,Following commands,Safety/judgement Memory: Decreased short-term memory Following Commands: Follows one step commands with increased time Safety/Judgement: Decreased awareness of safety,Decreased awareness of deficits General Comments: Multi modal cues thorughout session with decreased carry over at times  Blood pressure (!) 141/88, pulse 90, temperature 99.3 F (37.4 C), temperature source Oral, resp. rate 20, height 5\' 10"  (1.778 m), weight 96.7 kg, SpO2 93 %. Physical  Exam Gen: no distress, normal appearing HEENT: oral mucosa pink and moist, NCAT Cardio: Reg rate Chest: normal effort, normal rate of breathing Abd: soft, non-distended Ext: no edema Psych: pleasant, normal affect Skin: intact Neurological:     Comments: Patient is alert and oriented x3  and in no acute distress.  Makes eye contact with examiner.  Provides name and age.  Follows simple commands. 5/5 strength, except for 4/5 in LLE   Results for orders placed or performed during the hospital encounter of 02/07/21 (from the past 24 hour(s))  CBC     Status: Abnormal   Collection Time: 02/14/21  1:36 AM  Result Value Ref Range   WBC 7.8 4.0 - 10.5 K/uL   RBC 3.56 (L) 4.22 - 5.81 MIL/uL   Hemoglobin 11.2 (L) 13.0 - 17.0 g/dL   HCT 32.4 (L) 39.0 - 52.0 %   MCV 91.0 80.0 - 100.0 fL   MCH 31.5 26.0 - 34.0 pg   MCHC 34.6 30.0 - 36.0 g/dL   RDW 14.0 11.5 - 15.5 %   Platelets 223 150 - 400 K/uL   nRBC 0.0 0.0 - 0.2 %   No results found.  Assessment/Plan: Diagnosis: Chronic R SDH s/p burr hole on 2/28 1. Does the need for close, 24 hr/day medical supervision in concert with the patient's rehab needs make it unreasonable for this patient to be served in a less intensive setting? Yes 2. Co-Morbidities requiring supervision/potential complications:  1. Hyponatremia: monitor Na regularly 2. Pre-syncope: monitor closely in therapy 3. Decreasing oxygen saturation: monitor sats TID 4. Submassive PE: continue heparin 5. Dilated cardiomyopathy 3. Due to bladder management, bowel management, safety, skin/wound care, disease management, medication administration, pain management and patient education, does the patient require 24 hr/day rehab nursing? Yes 4. Does the patient require coordinated care of a physician, rehab nurse, therapy disciplines of PT, OT, SLP to address physical and functional deficits in the context of the above medical diagnosis(es)? Yes Addressing deficits in the following  areas: balance, endurance, locomotion, strength, transferring, bowel/bladder control, bathing, dressing, feeding, grooming, toileting, cognition and psychosocial support 5. Can the patient actively participate in an intensive therapy program of at least 3 hrs of therapy per day at least 5 days per week? Yes 6. The potential for patient to make measurable gains while on inpatient rehab is excellent 7. Anticipated functional outcomes upon discharge from inpatient rehab are supervision  with PT, supervision with OT, supervision with SLP. 8. Estimated rehab length of stay to reach the above functional goals is: 5-7 days 9. Anticipated discharge destination: Home 10. Overall Rehab/Functional Prognosis: excellent  RECOMMENDATIONS: This patient's condition is appropriate for continued rehabilitative care in the following setting: CIR Patient has agreed to participate in recommended program. Yes Note that insurance prior authorization may be required for reimbursement for recommended care.  Comment: Thank you for this consult. Admission coordinator to follow.   I have personally performed a face to face diagnostic evaluation, including, but not limited to relevant history and physical exam findings, of this patient and developed relevant assessment and plan.  Additionally, I have reviewed and concur with the physician assistant's documentation above.  Leeroy Cha, MD  Lavon Paganini [place], PA-C 02/14/2021

## 2021-02-15 LAB — CBC
HCT: 30.2 % — ABNORMAL LOW (ref 39.0–52.0)
Hemoglobin: 10.6 g/dL — ABNORMAL LOW (ref 13.0–17.0)
MCH: 32 pg (ref 26.0–34.0)
MCHC: 35.1 g/dL (ref 30.0–36.0)
MCV: 91.2 fL (ref 80.0–100.0)
Platelets: 225 10*3/uL (ref 150–400)
RBC: 3.31 MIL/uL — ABNORMAL LOW (ref 4.22–5.81)
RDW: 13.7 % (ref 11.5–15.5)
WBC: 6.9 10*3/uL (ref 4.0–10.5)
nRBC: 0 % (ref 0.0–0.2)

## 2021-02-15 MED ORDER — LEVOTHYROXINE SODIUM 112 MCG PO TABS: 112.0000 ug | ORAL_TABLET | Freq: Every day | ORAL | Status: DC

## 2021-02-15 NOTE — PMR Pre-admission (Addendum)
PMR Admission Coordinator Pre-Admission Assessment  Patient: John Mack is an 80 y.o., male MRN: [id number] DOB: May 01, 1941 Height: 5\' 10"  (177.8 cm) Weight: 96.7 kg              Insurance Information HMO:     PPO:      PCP:      IPA:      80/20:      OTHER:  PRIMARY: Medicare Part A and B       Policy#:[id number]      Subscriber: Pt.  CM Name:       Phone#:      Fax#:  Pre-Cert#: verified Civil engineer, contracting:  Benefits:  Phone #:      Name:  Eff. Date: 11/10/08 A and B     Deduct: $1566      Out of Pocket Max: n/a      Life Max: n/a CIR: 100%      SNF: 20 full days Outpatient: 80%     Co-Pay: 20% Home Health: 100%      Co-Pay:  DME: 80%     Co-Pay: 20% Providers: pt choice Providers: not anticipated SECONDARY: [employer]      Policy#: [id number]      Phone#:   Financial Counselor:       Phone#:   The "Data Collection Information Summary" for patients in Inpatient Rehabilitation Facilities with attached "Privacy Act [place] Records" was provided and verbally reviewed with: Patient  Emergency Contact Information Contact Information    Name Relation Home Work Mobile   [place] [phone]     [place] Sister [phone]  [phone]     Current Medical History  Patient Admitting Diagnosis: SDH s/p Trudee Kuster hole History of Present Illness: John Mack is a 80 y.o. right-handed male with history of diastolic congestive heart failure, dilated cardiomyopathy ejection fraction 45% followed at [facility], hypertension, SDH after fall 11/30/2020.  Per chart review patient lives with spouse.  Two-level home bed and bath main level 2 steps to entry.  Poorly independent prior to admission caring for his wheelchair-bound wife.  Presented 02/07/2021 with persistent headache decreased functional mobility.  X-rays and imaging revealed right panhemispheric chronic subdural hematoma.  Underwent right bur hole for subdural hematoma 02/07/2021 per Dr.  Christella Noa.  Maintained on Keppra for seizure prophylaxis.  Hospital course episode presyncope associated PVCs identified on telemetry.  Noted blood pressure 60 over 40s received IV fluid bolus.  Echocardiogram showed moderately depressed RV systolic function with ejection fraction 50 to 55% with cardiology service follow-up.  Currently maintained on amiodarone.  CTA of the chest obtained revealed acute submassive PE with RV/LV of 1.91 and lower extremity Dopplers were negative.  Patient was cleared to begin Eliquis.  Tolerating a regular diet.  Therapy evaluations completed due to patient decreased functional mobility recommendations of physical medicine rehab consult. He was an avid runner at home- all kinds of distances. Cannot recall why he fell.    Glasgow Coma Scale Score: (!) 20  Past Medical History  Past Medical History:  Diagnosis Date  . Allergy   . Anxiety   . Arthritis   . Asthma   . Cancer ([place])    skin  . CHF (congestive heart failure) (HCC)    chornic systolic CHF  . DCM (dilated cardiomyopathy) ([place]) 04/18/2017   ED 45% by echo at [facility]  . Depression   . Diverticulosis   . Dysrhythmia   .  Eosinophilic esophagitis 12/12/5850   EGD 04/2014 17 Eos/hpf esophageal bxs  . Eye abnormalities    calcium deposits behind eyes  . Frequent PVCs   . GERD (gastroesophageal reflux disease)   . Hx of colonic polyps 12/18/2011  . Hyperlipidemia   . Hypertension   . Hypothyroidism   . SDH (subdural hematoma) ([place]) 11/30/2020  . 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 Thyroid disease    hypothyroid    Family History  family history includes Mesothelioma in his paternal aunt and paternal uncle.  Prior Rehab/Hospitalizations:  Has the patient had prior rehab or hospitalizations prior to admission? No  Has the patient had major surgery during 100 days prior to admission? Yes  Current Medications   Current Facility-Administered Medications:  .  acetaminophen (TYLENOL) tablet 650 mg, 650 mg, Oral,  Q4H PRN, 650 mg at 02/14/21 2037 **OR** acetaminophen (TYLENOL) suppository 650 mg, 650 mg, Rectal, Q4H PRN, Ashok Pall, MD .  albuterol (VENTOLIN HFA) 108 (90 Base) MCG/ACT inhaler 1 puff, 1 puff, Inhalation, Q6H PRN, Ashok Pall, MD .  ALPRAZolam Duanne Moron) tablet 1 mg, 1 mg, Oral, QHS PRN, Ashok Pall, MD, 1 mg at 02/15/21 0324 .  amiodarone (PACERONE) tablet 200 mg, 200 mg, Oral, Daily, Ashok Pall, MD, 200 mg at 02/14/21 0919 .  amLODipine (NORVASC) tablet 2.5 mg, 2.5 mg, Oral, Daily, Julian Hy, DO, 2.5 mg at 02/14/21 0920 .  apixaban (ELIQUIS) tablet 10 mg, 10 mg, Oral, BID, 10 mg at 02/14/21 2041 **FOLLOWED BY** [START ON 02/19/2021] apixaban (ELIQUIS) tablet 5 mg, 5 mg, Oral, BID, Carlis Abbott, Laura P, DO .  ceFAZolin (ANCEF) IVPB 1 g/50 mL premix, 1 g, Intravenous, Q8H, Cabbell, Marylyn Ishihara, MD, Last Rate: 100 mL/hr at 02/15/21 0608, 1 g at 02/15/21 0608 .  Chlorhexidine Gluconate Cloth 2 % PADS 6 each, 6 each, Topical, Daily, Ashok Pall, MD, 6 each at 02/14/21 [phone] .  diphenhydrAMINE-zinc acetate (BENADRYL) 2-0.1 % cream, , Topical, TID PRN, Ogan, [place]wo U, MD .  docusate sodium (COLACE) capsule 100 mg, 100 mg, Oral, BID, Bowser, Grace E, NP, 100 mg at 02/14/21 2041 .  EPINEPHrine (EPI-PEN) injection 0.3 mg, 0.3 mg, Intramuscular, PRN, Ashok Pall, MD .  HYDROcodone-acetaminophen (NORCO/VICODIN) 5-325 MG per tablet 1 tablet, 1 tablet, Oral, Q4H PRN, Ashok Pall, MD, 1 tablet at 02/15/21 0324 .  labetalol (NORMODYNE) injection 10-40 mg, 10-40 mg, Intravenous, Q10 min PRN, Ashok Pall, MD, 20 mg at 02/07/21 1909 .  levETIRAcetam (KEPPRA) tablet 500 mg, 500 mg, Oral, BID, Ashok Pall, MD, 500 mg at 02/14/21 2039 .  levothyroxine (SYNTHROID) tablet 112 mcg, 112 mcg, Oral, QAC breakfast, Ashok Pall, MD, 112 mcg at 02/15/21 0612 .  LORazepam (ATIVAN) tablet 0.5 mg, 0.5 mg, Oral, QPC lunch, Ashok Pall, MD, 0.5 mg at 02/14/21 1252 .  LORazepam (ATIVAN) tablet 1 mg, 1 mg, Oral, BID,  Ashok Pall, MD, 1 mg at 02/14/21 2039 .  metoprolol succinate (TOPROL-XL) 24 hr tablet 25 mg, 25 mg, Oral, BID, Julian Hy, DO, 25 mg at 02/14/21 2040 .  mometasone-formoterol (DULERA) 100-5 MCG/ACT inhaler 2 puff, 2 puff, Inhalation, BID, Joselyn Glassman A, RPH, 2 puff at 02/15/21 0750 .  multivitamin with minerals tablet, , Oral, Daily, Ashok Pall, MD, 1 tablet at 02/14/21 0919 .  ondansetron (ZOFRAN) tablet 4 mg, 4 mg, Oral, Q4H PRN, 4 mg at 02/11/21 0752 **OR** ondansetron (ZOFRAN) injection 4 mg, 4 mg, Intravenous, Q4H PRN, Ashok Pall, MD .  pantoprazole (PROTONIX) EC  tablet 40 mg, 40 mg, Oral, QHS, Ashok Pall, MD, 40 mg at 02/14/21 2042 .  polyvinyl alcohol (LIQUIFILM TEARS) 1.4 % ophthalmic solution 1 drop, 1 drop, Both Eyes, Daily PRN, Ashok Pall, MD .  promethazine (PHENERGAN) tablet 12.5-25 mg, 12.5-25 mg, Oral, Q4H PRN, Ashok Pall, MD .  senna-docusate (Senokot-S) tablet 1 tablet, 1 tablet, Oral, BID, Bowser, Laurel Dimmer, NP, 1 tablet at 02/14/21 2041 .  sodium chloride tablet 2 g, 2 g, Oral, BID, Ashok Pall, MD, 2 g at 02/14/21 2040  Patients Current Diet:  Diet Order            Diet heart healthy/carb modified Room service appropriate? Yes with Assist; Fluid consistency: Thin  Diet effective now                 Precautions / Restrictions Precautions Precautions: Fall Precaution Comments: drain from burr holes, no need to clamp Restrictions Weight Bearing Restrictions: No   Has the patient had 2 or more falls or a fall with injury in the past year?Yes  Prior Activity Level Community (5-7x/wk): Pt. was active in the community PTA  Prior Functional Level Prior Function Level of Independence: Independent Comments: fully independence, caring for his w/c level wife (manually lifts pt in and out of w/c) - wife may d/c to ST-SNF while he is recovering  Self Care: Did the patient need help bathing, dressing, using the toilet or eating?   Independent  Indoor Mobility: Did the patient need assistance with walking from room to room (with or without device)? Independent  Stairs: Did the patient need assistance with internal or external stairs (with or without device)? Independent  Functional Cognition: Did the patient need help planning regular tasks such as shopping or remembering to take medications? Independent  Home Assistive Devices / [place] Devices/Equipment: Eyeglasses Home Equipment: Other (comment),Grab bars - tub/shower,Grab bars - toilet  Prior Device Use: Indicate devices/aids used by the patient prior to current illness, exacerbation or injury? None of the above  Current Functional Level Cognition  Overall Cognitive Status: Impaired/Different from baseline Orientation Level: Oriented X4 Following Commands: Follows one step commands with increased time Safety/Judgement: Decreased awareness of safety,Decreased awareness of deficits General Comments: Pt perseverating on certain topics (for example, pt repeating that he would like his head shaved throughout session despite cues and conversation that herapist with talk with RN). Pt with poor awarneess of deficits and change in functional status    Extremity Assessment (includes Sensation/Coordination)  Upper Extremity Assessment: Overall WFL for tasks assessed  Lower Extremity Assessment: Defer to PT evaluation    ADLs  Overall ADL's : Needs assistance/impaired Eating/Feeding: Supervision/ safety,Set up,Sitting Grooming: Minimal assistance,Standing Upper Body Bathing: Minimal assistance,Standing Lower Body Bathing: Minimal assistance,Sit to/from stand Upper Body Dressing : Supervision/safety,Set up,Sitting Lower Body Dressing: Moderate assistance,Sit to/from stand Toilet Transfer: Ambulation,RW,Minimal assistance (simulated to recliner) Toilet Transfer Details (indicate cue type and reason): Min A for safe descent Functional mobility during  ADLs: Minimal assistance,Rolling walker General ADL Comments: Pt requesting to take a sponge bath at sink. Requiring Min A throughout for decreased balance. Pt also decreased cognition and awareness compared to prior sessions.    Mobility  Overal bed mobility: Needs Assistance Bed Mobility: Sit to Supine Supine to sit: Min assist,HOB elevated Sit to supine: Min guard General bed mobility comments: Min Guard A for safety    Transfers  Overall transfer level: Needs assistance Equipment used: Rolling walker (2 wheeled) Transfers: Sit to/from Stand Sit to  Stand: Min assist,Min guard General transfer comment: Min Guard A for safety into standing and then Min A for safe descent    Ambulation / Gait / Stairs / Emergency planning/management officer  Ambulation/Gait Ambulation/Gait assistance: Herbalist (Feet): 100 Feet Assistive device: Rolling walker (2 wheeled) Gait Pattern/deviations: Decreased stride length,Shuffle,Step-to pattern,Decreased stance time - right General Gait Details: Step to R gait with mild instability requiring min A to steady.  Frequent cues for larger steps and RW proximity.  When cued for increased step length he would take 2-3 but then resort back to shuffle Gait velocity: significantly decreased Gait velocity interpretation: <1.31 ft/sec, indicative of household ambulator    Posture / Balance Balance Overall balance assessment: Needs assistance Sitting-balance support: Feet supported,Bilateral upper extremity supported,No upper extremity supported Sitting balance-[name]: Fair Standing balance support: Bilateral upper extremity supported Standing balance-[name]: Poor Standing balance comment: Reliance on RW for BUE support    Special needs/care consideration Skin surgical incision     Previous Home Environment (from acute therapy documentation) Living Arrangements: Spouse/significant other Available Help at Discharge: Family,Available 24  hours/day Type of Home: House Home Layout: Able to live on main level with bedroom/bathroom Home Access: Stairs to enter,Ramped entrance Entrance Stairs-Number of Steps: 2 (front), 3 (carport) Bathroom Shower/Tub: Walk-in shower (lip to step over) Biochemist, clinical: Handicapped height Home Care Services: No Additional Comments: hoyer lift for wife (does not use)  Discharge Living Setting Plans for Discharge Living Setting: Patient's home Type of Home at Discharge: House Discharge Home Layout: One level Discharge Home Access: Stairs to enter,Ramped entrance Entrance Stairs-Rails: Left Entrance Stairs-Number of Steps: 2 Discharge Bathroom Shower/Tub: Walk-in shower Discharge Bathroom Toilet: Handicapped height Discharge Bathroom Accessibility: No Does the patient have any problems obtaining your medications?: No  Social/Family/Support Systems Patient Roles: Other (Comment) Anticipated Caregiver: Daryel November, Private duty RN Caregiver Availability: 24/7 Discharge Plan Discussed with Primary Caregiver: Yes Is Caregiver In Agreement with Plan?: No Does Caregiver/Family have Issues with Lodging/Transportation while Pt is in Rehab?: No   Goals Patient/Family Goal for Rehab: PT/OT Supervision Expected length of stay: 5-7 days Pt/Family Agrees to Admission and willing to participate: Yes Program Orientation Provided & Reviewed with Pt/Caregiver Including Roles  & Responsibilities: Yes   Decrease burden of Care through IP rehab admission: Specialzed equipment needs, Decrease number of caregivers and Patient/family education   Possible need for SNF placement upon discharge:not anticipated    Patient Condition: This patient's condition remains as documented in the consult dated 02/14/21, in which the Rehabilitation Physician determined and documented that the patient's condition is appropriate for intensive rehabilitative care in an inpatient rehabilitation facility. Will admit to  inpatient rehab today.  Preadmission Screen Completed By:  Genella Mech, CCC-SLP, 02/15/2021 9:14 AM ______________________________________________________________________   Discussed status with Dr. Dagoberto Ligas on 3/8/22at 1000 and received approval for admission today.  Admission Coordinator:  Genella Mech, time 0938 /Date 3/8/222   Addendum: Patient held yesterday by neurosurgery due to sodium levels, later, per neurosurgery, stable for discharge.  Will plan for admission on 02/16/2021.

## 2021-02-15 NOTE — Progress Notes (Signed)
Physical Therapy Treatment Patient Details Name: John Mack MRN: [id number] DOB: October 04, 1941 Today's Date: 02/15/2021    History of Present Illness Pt is 80 yo male admitted to [facility] on 2/28, s/p R burr holes and drain placement for chronic R SDH. CTH on 3/2 shows decompressed subdural hemorrhage on the right without complicating features; midline shift has diminished to 4 mm. On 3/3 pt had episode of pre-syncope with PT and decreasing O2 sats with rapid response called. Pt was then found to have submassive PE and started on Heparin. PMH includes anxiety, skin cancer, HF, dilated cardiomyopathy, depression, HLD, HTN, TIAs, arthroscopies L shoulder and B knees.    PT Comments    Pt improving slowly toward  goals.  He is very anxious, perseverative on falling.  Emphasis on transitions, sit to standing, progressing gait stability and pattern quality.    Follow Up Recommendations  CIR     Equipment Recommendations  None recommended by PT    Recommendations for Other Services Rehab consult     Precautions / Restrictions Precautions Precautions: Fall    Mobility  Bed Mobility Overal bed mobility: Needs Assistance Bed Mobility: Sit to Supine     Supine to sit: Min guard;HOB elevated     General bed mobility comments: cues for direction,    Transfers Overall transfer level: Needs assistance Equipment used: Rolling walker (2 wheeled) Transfers: Sit to/from Stand Sit to Stand: Min assist         General transfer comment: cues for hand placement, stability assist  Ambulation/Gait Ambulation/Gait assistance: Min assist Gait Distance (Feet): 95 Feet (then additional 90 feet with RW) Assistive device: Rolling walker (2 wheeled) Gait Pattern/deviations: Step-through pattern;Decreased stride length;Shuffle Gait velocity: significantly decreased Gait velocity interpretation: <1.31 ft/sec, indicative of household ambulator General Gait Details: mildly unsteady, almost  retropulsive, shuffles steps, very short step length.   Stairs             Wheelchair Mobility    Modified Rankin (Stroke Patients Only)       Balance Overall balance assessment: Needs assistance Sitting-balance support: Feet supported Sitting balance-[name]: Fair     Standing balance support: Bilateral upper extremity supported Standing balance-[name]: Poor Standing balance comment: Reliance on RW for BUE support                            Cognition Arousal/Alertness: Awake/alert Behavior During Therapy: WFL for tasks assessed/performed Overall Cognitive Status: Impaired/Different from baseline                       Memory: Decreased short-term memory Following Commands: Follows one step commands with increased time Safety/Judgement: Decreased awareness of safety;Decreased awareness of deficits     General Comments: `      Exercises      General Comments        Pertinent Vitals/Pain Pain Assessment: No/denies pain    Home Living                      Prior Function            PT Goals (current goals can now be found in the care plan section) Acute Rehab PT Goals Patient Stated Goal: go home PT Goal Formulation: With patient Time For Goal Achievement: 02/23/21 Potential to Achieve Goals: Good Progress towards PT goals: Progressing toward goals    Frequency    Min 4X/week  PT Plan Discharge plan needs to be updated    Co-evaluation              AM-PAC PT "6 Clicks" Mobility   Outcome Measure  Help needed turning from your back to your side while in a flat bed without using bedrails?: A Little Help needed moving from lying on your back to sitting on the side of a flat bed without using bedrails?: A Little Help needed moving to and from a bed to a chair (including a wheelchair)?: A Little Help needed standing up from a chair using your arms (e.g., wheelchair or bedside chair)?: A  Little Help needed to walk in hospital room?: A Little Help needed climbing 3-5 steps with a railing? : A Lot 6 Click Score: 17    End of Session   Activity Tolerance: Patient tolerated treatment well Patient left: in chair;with call bell/phone within reach;with chair alarm set Nurse Communication: Mobility status PT Visit Diagnosis: Other abnormalities of gait and mobility (R26.89);History of falling (Z91.81)     Time: 3382-5053 PT Time Calculation (min) (ACUTE ONLY): 33 min  Charges:  $Gait Training: 8-22 mins $Therapeutic Activity: 8-22 mins                     02/15/2021  Ginger Carne., PT Acute Rehabilitation Services [phone]  (pager) [phone]  (office)   John Mack 02/15/2021, 3:40 PM

## 2021-02-15 NOTE — Progress Notes (Signed)
Inpatient Rehab Admissions Coordinator:   Noted discharge orders have now been entered. Therapy schedulers have left for the day, so I cannot admit to CIR this evening. I will consider Pt. For admission tomorrow pending bed availability.   Clemens Catholic, [place], [place] Admissions Coordinator  [phone] ([place]) [phone] (office)

## 2021-02-15 NOTE — Care Management Important Message (Signed)
Important Message  Patient Details  Name: John Mack MRN: [id number] Date of Birth: [birth year]   Medicare Important Message Given:  Yes     Orbie Pyo 02/15/2021, 3:26 PM

## 2021-02-15 NOTE — Discharge Summary (Signed)
Physician Discharge Summary  Patient ID: John Mack MRN: [id number] DOB/AGE: 1941-07-21 81 y.o.  Admit date: 02/07/2021 Discharge date: 02/15/2021  Admission Diagnoses:chronic subdural hematoma  Discharge Diagnoses:  Active Problems:   Subdural hematoma (HCC)   Subdural hematoma, chronic ([place])   Discharged Condition: good  Hospital Course: John Mack was admitted for an elective burr hole for hematoma evacuation. He underwent a burr hole and ventricular drain placement in the subdural space. He did well post op until he was tachycardic and had a slight irregular rhythm. He then had a syncopal episode. Found to have a submassive PE on ct angio, he was started on heparin. This caused no neurological decline and he was subsequently placed on eliquis. Arrangements were made for outpatient follow up. He is discharged to the Rehabilitation service doing well and normal neurologically.   Treatments: surgery: burr hole for subdural hematoma  Discharge Exam: Blood pressure (!) 158/83, pulse 85, temperature 98.3 F (36.8 C), temperature source Oral, resp. rate 19, height 5\' 10"  (1.778 m), weight 96.7 kg, SpO2 97 %. General appearance: alert, cooperative and no distress  Disposition: Discharge disposition: 70-Another Health Care Institution Not Defined      Subdural hematoma     Signed: Ashok Pall 02/15/2021, 4:29 PM

## 2021-02-15 NOTE — H&P (Addendum)
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   History taken from chart review and patient.  Patient lives with spouse.  Two-level home bed and bath main level with 2 steps to entry.  Reportedly independent prior to admission caring for his wife who is wheelchair-bound.  He presented on 01/30/2021 with persistent headaches as well as decreased functional mobility.  X-rays imaging revealed right panhemispheric chronic SDH.  Underwent right bur hole for subdural hematoma  On 02/07/2021 per Dr. Christella Noa.  Maintained on Keppra for seizure prophylaxis.  Hospital course episode presyncope associated PVCs identified on telemetry.  Noted blood pressure 60/40 received IV fluid with bolus.  Echocardiogram with ejection fraction of 50-55%, moderately depressed RV systolic function with cardiology service follow-up.  Initially maintained on amiodarone.  CTA of the chest obtained on 02/10/2021 revealed acute bilateral PEs,  a saddle embolus at right pulmonary hilum extending into right upper lobe and right middle lobe as well as multiple small emboli within the right lower lobe, left lower lobe and left upper lobe pulmonary arteries.  Lower extremity Dopplers negative.  Patient was cleared to begin Eliquis for pulmonary emboli after initially being maintained on intravenous heparin.  Patient does have a history of persistent hyponatremia 125-131 and monitored.  Tolerating a regular diet.  Therapy evaluations completed and due to patient's decreased functional mobility recommendations of physical medicine rehab consult the patient was admitted for a comprehensive rehab program.  Please see preadmission assessment from earlier  today as well.  Review of Systems  Constitutional: Negative for chills and fever.  HENT: Negative for hearing loss.   Eyes: Negative for blurred vision and double vision.  Respiratory: Negative for cough.        Occasional shortness of breath with exertion  Cardiovascular: Negative for chest pain, palpitations and leg swelling.  Gastrointestinal: Positive for constipation. Negative for heartburn, nausea and vomiting.       GERD  Genitourinary: Negative for dysuria, flank pain and hematuria.  Musculoskeletal: Positive for joint pain and myalgias.  Skin: Negative for rash.  Neurological: Positive for dizziness and headaches.  Psychiatric/Behavioral: Positive for depression. The patient has insomnia.        Anxiety  All other systems reviewed and are negative.  Past Medical History:  Diagnosis Date  . Allergy   . Anxiety   . Arthritis   . Asthma   . Cancer ([place])    skin  . CHF (congestive heart failure) (HCC)    chornic systolic CHF  . DCM (dilated cardiomyopathy) ([place]) 04/18/2017   ED 45% by echo at [facility]  . Depression   . Diverticulosis   . Dysrhythmia   . Eosinophilic esophagitis 5/57/3220   EGD 04/2014 17 Eos/hpf esophageal bxs  . Eye abnormalities    calcium deposits behind eyes  . Frequent PVCs   . GERD (gastroesophageal reflux disease)   . Hx of colonic polyps 12/18/2011  . Hyperlipidemia   . Hypertension   . Hypothyroidism   . SDH (subdural hematoma) ([place]) 11/30/2020  . Stroke [facility])    2 TIAs  . Thyroid disease    hypothyroid   Past Surgical History:  Procedure Laterality Date  . APPENDECTOMY    . BURR HOLE Right 02/07/2021   Procedure: Right  Burr holes for subdural hematoma;  Surgeon: Ashok Pall, MD;  Location: [facility];  Service: Neurosurgery;  Laterality: Right;  Right Burr holes for subdural hematoma  . COLONOSCOPY  10/02/2006, 12/18/2011  . ESOPHAGOSCOPY  12/18/2011   with Maloney dilation  . KNEE ARTHROSCOPY     both knees  . SHOULDER ARTHROSCOPY     left  shoulder, both knees  . SHOULDER SURGERY     right surgery  . TONSILLECTOMY    . UPPER GASTROINTESTINAL ENDOSCOPY  02/04/2009   with Venia Minks dilation   Family History  Problem Relation Age of Onset  . Mesothelioma Paternal Aunt   . Mesothelioma Paternal Uncle   . Colon cancer Neg Hx   . Esophageal cancer Neg Hx   . Stomach cancer Neg Hx   . Rectal cancer Neg Hx    Social History:  reports that he has never smoked. He has never used smokeless tobacco. He reports that he does not drink alcohol and does not use drugs. Allergies: No Known Allergies Medications Prior to Admission  Medication Sig Dispense Refill  . Albuterol Sulfate, sensor, (PROAIR DIGIHALER) 108 (90 Base) MCG/ACT AEPB Inhale 1 puff into the lungs every 6 (six) hours as needed (shortness of breath).    Marland Kitchen alprazolam (XANAX) 2 MG tablet Take 2 mg by mouth at bedtime.    Marland Kitchen amiodarone (PACERONE) 200 MG tablet Take 200 mg by mouth daily.    Marland Kitchen amLODipine (NORVASC) 2.5 MG tablet Take 2.5 mg by mouth daily.    . cyanocobalamin (,VITAMIN B-12,) 1000 MCG/ML injection Inject 1,000 mcg into the muscle See admin instructions. Inject 1 mL (101mcg) intramuscularly every 30 days as needed for vitamin B12/energy.    Marland Kitchen EPINEPHrine 0.3 mg/0.3 mL IJ SOAJ injection Inject 0.3 mg into the muscle as needed for anaphylaxis.    Marland Kitchen esomeprazole (NEXIUM) 40 MG capsule Take 40 mg by mouth 2 (two) times daily.    . hydrochlorothiazide (HYDRODIURIL) 25 MG tablet Take 25 mg by mouth daily.    Marland Kitchen levothyroxine (SYNTHROID) 112 MCG tablet Take 112 mcg by mouth every morning.    Marland Kitchen LORazepam (ATIVAN) 1 MG tablet Take 0.5-1 mg by mouth See admin instructions. Take 1 tablet (1mg ) by mouth twice daily and 1/2 tablet (0.5mg ) by mouth after lunch.    . metoprolol succinate (TOPROL-XL) 25 MG 24 hr tablet Take 25 mg by mouth 2 (two) times daily.    . Multiple Vitamins-Minerals (MULTIVITAMIN PO) Take 1 tablet by mouth daily.     . pantoprazole (PROTONIX) 40 MG tablet  Take 1 tablet by mouth 2 (two) times daily.    Vladimir Faster Glycol-Propyl Glycol (SYSTANE OP) Place 1 drop into both eyes daily as needed (dry eyes).    . pravastatin (PRAVACHOL) 20 MG tablet Take 20 mg by mouth daily.    . SYMBICORT 160-4.5 MCG/ACT inhaler Inhale 2 puffs into the lungs 2 (two) times daily as needed for shortness of breath.    . telmisartan (MICARDIS) 80 MG tablet Take 80 mg by mouth daily.    Marland Kitchen testosterone cypionate (DEPOTESTOTERONE CYPIONATE) 100 MG/ML injection Inject 200 mg into the muscle every 14 (fourteen) days. For IM use only    . vardenafil (LEVITRA) 20 MG tablet Take 20 mg by mouth daily as needed for erectile dysfunction.    . [DISCONTINUED] Levothyroxine Sodium 112 MCG CAPS Take 100 mcg by mouth daily before breakfast.      Drug Regimen Review Drug regimen was reviewed and remains  appropriate with no significant issues identified  Home: Home Living Family/patient expects to be discharged to:: Private residence Living Arrangements: Spouse/significant other Available Help at Discharge: Family,Available 24 hours/day Type of Home: House Home Access: Stairs to enter,Ramped entrance Entrance Stairs-Number of Steps: 2 (front), 3 (carport) Home Layout: Able to live on main level with bedroom/bathroom Bathroom Shower/Tub: Walk-in shower (lip to step over) Biochemist, clinical: Handicapped height Home Equipment: Other (comment),Grab bars - tub/shower,Grab bars - toilet Additional Comments: hoyer lift for wife (does not use)   Functional History: Prior Function Level of Independence: Independent Comments: fully independence, caring for his w/c level wife (manually lifts pt in and out of w/c) - wife may d/c to [facility] while he is recovering  Functional Status:  Mobility: Bed Mobility Overal bed mobility: Needs Assistance Bed Mobility: Supine to Sit Supine to sit: Min guard Sit to supine: Min guard General bed mobility comments: min guard for  safety Transfers Overall transfer level: Needs assistance Equipment used: Rolling walker (2 wheeled) Transfers: Sit to/from [employer] Sit to Stand: Min guard Stand pivot transfers: Min guard General transfer comment: min guard for safety and verbal cues for hand placement Ambulation/Gait Ambulation/Gait assistance: Min assist Gait Distance (Feet): 95 Feet (then additional 90 feet with RW) Assistive device: Rolling walker (2 wheeled) Gait Pattern/deviations: Step-through pattern,Decreased stride length,Shuffle General Gait Details: mildly unsteady, almost retropulsive, shuffles steps, very short step length. Gait velocity: significantly decreased Gait velocity interpretation: <1.31 ft/sec, indicative of household ambulator    ADL: ADL Overall ADL's : Needs assistance/impaired Eating/Feeding: Supervision/ safety,Set up,Sitting Grooming: Wash/dry hands,Wash/dry face,Oral care,Min guard,Standing Grooming Details (indicate cue type and reason): verbal cues for sequencing and thoroughness Upper Body Bathing: Minimal assistance,Standing Lower Body Bathing: Minimal assistance,Sit to/from stand Upper Body Dressing : Supervision/safety,Set up,Sitting Lower Body Dressing: Minimal assistance,Sit to/from stand Toilet Transfer: Min guard,Ambulation,Comfort height toilet,Grab bars,RW Toilet Transfer Details (indicate cue type and reason): verbal cues for hand placement Functional mobility during ADLs: Minimal assistance,Rolling walker General ADL Comments: Pt requesting to take a sponge bath at sink. Requiring Min A throughout for decreased balance. Pt also decreased cognition and awareness compared to prior sessions.  Cognition: Cognition Overall Cognitive Status: Impaired/Different from baseline Orientation Level: Oriented X4 Cognition Arousal/Alertness: Awake/alert Behavior During Therapy: WFL for tasks assessed/performed Overall Cognitive Status: Impaired/Different from  baseline Area of Impairment: Attention,Memory,Following commands,Awareness,Problem solving,Safety/judgement Current Attention Level: Selective Memory: Decreased short-term memory Following Commands: Follows one step commands consistently,Follows multi-step commands inconsistently Safety/Judgement: Decreased awareness of deficits,Decreased awareness of safety Awareness: Emergent Problem Solving: Requires verbal cues,Requires tactile cues,Difficulty sequencing General Comments: Pt repeats self frequently with no awareness.   He requires cues for problem solving and sequencing as he frequently self distracts  Physical Exam: Blood pressure (!) 146/83, pulse 67, temperature 98.8 F (37.1 C), temperature source Oral, resp. rate 17, height 5\' 10"  (1.778 m), weight 96.7 kg, SpO2 96 %. Physical Exam Vitals reviewed.  Constitutional:      General: He is not in acute distress.    Appearance: He is obese.  HENT:     Head:     Comments: Bur hole site right scalp    Right Ear: External ear normal.     Left Ear: External ear normal.     Nose: Nose normal.  Eyes:     General:        Right eye: No discharge.        Left eye: No discharge.     Extraocular Movements: Extraocular movements  intact.  Cardiovascular:     Rate and Rhythm: Normal rate and regular rhythm.  Pulmonary:     Effort: Pulmonary effort is normal. No respiratory distress.     Breath sounds: Normal breath sounds. No stridor.  Abdominal:     General: Abdomen is flat. Bowel sounds are normal. There is no distension.  Musculoskeletal:     Cervical back: Normal range of motion and neck supple.     Comments: No edema or tenderness in extremities  Skin:    Comments: Right scalp incision site with staples CDI  Neurological:     Mental Status: He is alert.     Comments: Alert Makes eye contact with examiner.   Follows simple commands.   Provides name age and place.   He was not able to recall why he fell in early  December. Motor: 4+/5 throughout  Psychiatric:        Mood and Affect: Mood normal.        Behavior: Behavior normal.     Results for orders placed or performed during the hospital encounter of 02/07/21 (from the past 48 hour(s))  Sodium     Status: Abnormal   Collection Time: 02/14/21  1:36 PM  Result Value Ref Range   Sodium 125 (L) 135 - 145 mmol/L    Comment: Performed at [facility], [place] [address], [place], [place] [address]  CBC     Status: Abnormal   Collection Time: 02/15/21  4:03 AM  Result Value Ref Range   WBC 6.9 4.0 - 10.5 K/uL   RBC 3.31 (L) 4.22 - 5.81 MIL/uL   Hemoglobin 10.6 (L) 13.0 - 17.0 g/dL   HCT 30.2 (L) 39.0 - 52.0 %   MCV 91.2 80.0 - 100.0 fL   MCH 32.0 26.0 - 34.0 pg   MCHC 35.1 30.0 - 36.0 g/dL   RDW 13.7 11.5 - 15.5 %   Platelets 225 150 - 400 K/uL   nRBC 0.0 0.0 - 0.2 %    Comment: Performed at [facility], [place] [address] [place], [place] [address]   No results found.     Medical Problem List and Plan: 1.  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  Status post bur hole 02/07/2021  -patient may not shower  -ELOS/Goals: 7-10 days/mod/supervision  Admit to CIR 2.  Antithrombotics: Bilateral pulmonary emboli identified on CT angiogram of the chest 02/10/2021 -DVT/anticoagulation: Eliquis  -antiplatelet therapy: N/A 3. Pain Management: Hydrocodone as needed  Monitor with increased exertion, particularly for vascular headaches 4. Mood: Xanax 1 mg nightly as needed  -antipsychotic agents: N/A 5. Neuropsych: This patient is capable of making decisions on his own behalf. 6. Skin/Wound Care: Routine skin checks 7. Fluids/Electrolytes/Nutrition: Routine in and outs 8.  Hypotension with frequent PVCs.  Patient cleared to begin low-dose Norvasc 2.5 mg daily as well as home amiodarone 200 mg daily and Toprol 25 mg twice daily  Monitor with increased  exertion 9.  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  Ejection fraction 45%.  Followed at [facility].  Follow-up cardiology services as needed 10.  Seizure prophylaxis.  Keppra 500 mg twice daily. 11.  Hypothyroidism: Synthroid 12.  GERD.  Protonix 13.  Asthma.  Dulera 2 puffs twice daily 14.  Persistent hyponatremia: Continue sodium chloride tablets  CMP ordered for tomorrow a.m.  Lavon Paganini Angiulli, PA-C 02/16/2021   I have personally performed a  face to face diagnostic evaluation, including, but not limited to relevant history and physical exam findings, of this patient and developed relevant assessment and plan.  Additionally, I have reviewed and concur with the physician assistant's documentation above.  Delice Lesch, MD, ABPMR

## 2021-02-15 NOTE — Progress Notes (Signed)
Inpatient Rehab Admissions Coordinator:   Per CM, Pt.'s medical assistant, Ebony Hail, spoke with Dr. Christella Noa and he would like to hold CIR admission due to medical instability, low sodium. I will not admit today. I will continue to follow for potential admission pending bed availability and insurance authorization.  Clemens Catholic, [place], [place] Admissions Coordinator  [phone] ([place]) [phone] (office)

## 2021-02-15 NOTE — Progress Notes (Signed)
Inpatient Rehab Admissions Coordinator:    Pt. Needs d/c orders so that he can transfer to CIR. I have paged [facility] and Dr. Lacy Duverney medical assistant is to reach out to him to request d/c orders.   Clemens Catholic, [place], [place] Admissions Coordinator  [phone] ([place]) [phone] (office)

## 2021-02-15 NOTE — Progress Notes (Signed)
Inpatient Rehab Admissions Coordinator:    I have a CIR bed for this pt. Today. RN may call report to [phone].  Jaqlyn Gruenhagen, MS, CCC-SLP Rehab Admissions Coordinator  [phone] ([place]) [phone] (office)  

## 2021-02-15 NOTE — Progress Notes (Addendum)
Per case management, CIR has a bed ready and will admit this pt today but need d/c orders. This RN called [facility] to request d/c orders from Dr. Christella Noa and to verify if his sodium level needs to be rechecked. The receptionist put this RN on the phone with Dr. Lacy Duverney MA. The MA got back in touch with this RN and said that Dr. Christella Noa said "not to recheck the pt's sodium but that the pt is not medically stable for discharge." This RN secure chatted the case manager about the situation that the pt is not going to CIR today.   Discharge order placed by Dr. Christella Noa at [phone]. This RN reached back out to case management to see if there will still be a bed available for this pt today.  Justice Rocher, RN

## 2021-02-16 ENCOUNTER — Inpatient Hospital Stay (HOSPITAL_COMMUNITY)
Admission: RE | Admit: 2021-02-16 | Discharge: 2021-02-22 | DRG: 945 | Disposition: A | Discharge status: Home Health | Payer: Medicare Other | Source: Intra-hospital | Attending: Physical Medicine and Rehabilitation | Admitting: Physical Medicine and Rehabilitation

## 2021-02-16 ENCOUNTER — Encounter (HOSPITAL_COMMUNITY): Payer: Self-pay | Admitting: Physical Medicine & Rehabilitation

## 2021-02-16 ENCOUNTER — Other Ambulatory Visit: Payer: Self-pay

## 2021-02-16 DIAGNOSIS — Z8673 Personal history of transient ischemic attack (TIA), and cerebral infarction without residual deficits: Secondary | ICD-10-CM | POA: Diagnosis not present

## 2021-02-16 DIAGNOSIS — F419 Anxiety disorder, unspecified: Secondary | ICD-10-CM | POA: Diagnosis present

## 2021-02-16 DIAGNOSIS — Z808 Family history of malignant neoplasm of other organs or systems: Secondary | ICD-10-CM | POA: Diagnosis not present

## 2021-02-16 DIAGNOSIS — I5032 Chronic diastolic (congestive) heart failure: Secondary | ICD-10-CM | POA: Diagnosis present

## 2021-02-16 DIAGNOSIS — I493 Ventricular premature depolarization: Secondary | ICD-10-CM | POA: Diagnosis present

## 2021-02-16 DIAGNOSIS — I110 Hypertensive heart disease with heart failure: Secondary | ICD-10-CM | POA: Diagnosis present

## 2021-02-16 DIAGNOSIS — R270 Ataxia, unspecified: Secondary | ICD-10-CM | POA: Diagnosis present

## 2021-02-16 DIAGNOSIS — J45909 Unspecified asthma, uncomplicated: Secondary | ICD-10-CM | POA: Diagnosis present

## 2021-02-16 DIAGNOSIS — G441 Vascular headache, not elsewhere classified: Secondary | ICD-10-CM

## 2021-02-16 DIAGNOSIS — E876 Hypokalemia: Secondary | ICD-10-CM | POA: Diagnosis not present

## 2021-02-16 DIAGNOSIS — I2699 Other pulmonary embolism without acute cor pulmonale: Secondary | ICD-10-CM | POA: Diagnosis present

## 2021-02-16 DIAGNOSIS — S065X9A Traumatic subdural hemorrhage with loss of consciousness of unspecified duration, initial encounter: Secondary | ICD-10-CM | POA: Diagnosis present

## 2021-02-16 DIAGNOSIS — S065X9D Traumatic subdural hemorrhage with loss of consciousness of unspecified duration, subsequent encounter: Secondary | ICD-10-CM | POA: Diagnosis not present

## 2021-02-16 DIAGNOSIS — Z79890 Hormone replacement therapy (postmenopausal): Secondary | ICD-10-CM | POA: Diagnosis not present

## 2021-02-16 DIAGNOSIS — E039 Hypothyroidism, unspecified: Secondary | ICD-10-CM

## 2021-02-16 DIAGNOSIS — I2692 Saddle embolus of pulmonary artery without acute cor pulmonale: Secondary | ICD-10-CM | POA: Diagnosis present

## 2021-02-16 DIAGNOSIS — W19XXXD Unspecified fall, subsequent encounter: Secondary | ICD-10-CM | POA: Diagnosis present

## 2021-02-16 DIAGNOSIS — K219 Gastro-esophageal reflux disease without esophagitis: Secondary | ICD-10-CM | POA: Diagnosis present

## 2021-02-16 DIAGNOSIS — E785 Hyperlipidemia, unspecified: Secondary | ICD-10-CM | POA: Diagnosis present

## 2021-02-16 DIAGNOSIS — Z7951 Long term (current) use of inhaled steroids: Secondary | ICD-10-CM

## 2021-02-16 DIAGNOSIS — I959 Hypotension, unspecified: Secondary | ICD-10-CM | POA: Diagnosis present

## 2021-02-16 DIAGNOSIS — I10 Essential (primary) hypertension: Secondary | ICD-10-CM

## 2021-02-16 DIAGNOSIS — E871 Hypo-osmolality and hyponatremia: Secondary | ICD-10-CM | POA: Diagnosis present

## 2021-02-16 DIAGNOSIS — I420 Dilated cardiomyopathy: Secondary | ICD-10-CM | POA: Diagnosis present

## 2021-02-16 DIAGNOSIS — S065XAA Traumatic subdural hemorrhage with loss of consciousness status unknown, initial encounter: Secondary | ICD-10-CM | POA: Diagnosis present

## 2021-02-16 DIAGNOSIS — I6203 Nontraumatic chronic subdural hemorrhage: Secondary | ICD-10-CM | POA: Diagnosis present

## 2021-02-16 MED ORDER — METOPROLOL SUCCINATE ER 25 MG PO TB24
25.0000 mg | ORAL_TABLET | Freq: Two times a day (BID) | ORAL | Status: DC
  Administered 2021-02-16 – 2021-02-22 (×12): 25 mg via ORAL
  Filled 2021-02-16 (×14): qty 1

## 2021-02-16 MED ORDER — PANTOPRAZOLE SODIUM 40 MG PO TBEC
40.0000 mg | DELAYED_RELEASE_TABLET | Freq: Every day | ORAL | Status: DC
  Administered 2021-02-16 – 2021-02-21 (×6): 40 mg via ORAL
  Filled 2021-02-16 (×6): qty 1

## 2021-02-16 MED ORDER — LEVOTHYROXINE SODIUM 112 MCG PO TABS
112.0000 ug | ORAL_TABLET | Freq: Every day | ORAL | Status: DC
  Administered 2021-02-17 – 2021-02-22 (×6): 112 ug via ORAL
  Filled 2021-02-16 (×6): qty 1

## 2021-02-16 MED ORDER — ALPRAZOLAM 0.5 MG PO TABS
1.0000 mg | ORAL_TABLET | Freq: Every evening | ORAL | Status: DC | PRN
  Administered 2021-02-16 – 2021-02-19 (×4): 1 mg via ORAL
  Filled 2021-02-16 (×4): qty 2

## 2021-02-16 MED ORDER — WHITE PETROLATUM EX OINT
1.0000 "application " | TOPICAL_OINTMENT | CUTANEOUS | Status: DC | PRN
  Administered 2021-02-16: 1 via TOPICAL
  Filled 2021-02-16: qty 28.35

## 2021-02-16 MED ORDER — SODIUM CHLORIDE 1 G PO TABS
2.0000 g | ORAL_TABLET | Freq: Two times a day (BID) | ORAL | Status: DC
  Administered 2021-02-16 – 2021-02-22 (×12): 2 g via ORAL
  Filled 2021-02-16 (×12): qty 2

## 2021-02-16 MED ORDER — MENTHOL 3 MG MT LOZG
1.0000 | LOZENGE | OROMUCOSAL | Status: DC | PRN
  Administered 2021-02-16: 3 mg via ORAL
  Filled 2021-02-16: qty 9

## 2021-02-16 MED ORDER — APIXABAN 5 MG PO TABS
5.0000 mg | ORAL_TABLET | Freq: Two times a day (BID) | ORAL | Status: DC
  Administered 2021-02-19 – 2021-02-22 (×7): 5 mg via ORAL
  Filled 2021-02-16 (×7): qty 1

## 2021-02-16 MED ORDER — AMIODARONE HCL 200 MG PO TABS
200.0000 mg | ORAL_TABLET | Freq: Every day | ORAL | Status: DC
  Administered 2021-02-17 – 2021-02-22 (×6): 200 mg via ORAL
  Filled 2021-02-16 (×6): qty 1

## 2021-02-16 MED ORDER — ONDANSETRON HCL 4 MG/2ML IJ SOLN: 4.0000 mg | INTRAMUSCULAR | Status: DC | PRN

## 2021-02-16 MED ORDER — APIXABAN 5 MG PO TABS
10.0000 mg | ORAL_TABLET | Freq: Two times a day (BID) | ORAL | Status: AC
  Administered 2021-02-16 – 2021-02-18 (×5): 10 mg via ORAL
  Filled 2021-02-16 (×5): qty 2

## 2021-02-16 MED ORDER — HYDROCODONE-ACETAMINOPHEN 5-325 MG PO TABS
1.0000 | ORAL_TABLET | ORAL | Status: DC | PRN
Start: 2021-02-16 — End: 2021-02-17

## 2021-02-16 MED ORDER — MOMETASONE FURO-FORMOTEROL FUM 100-5 MCG/ACT IN AERO
2.0000 | INHALATION_SPRAY | Freq: Two times a day (BID) | RESPIRATORY_TRACT | Status: DC
  Administered 2021-02-17 – 2021-02-22 (×11): 2 via RESPIRATORY_TRACT
  Filled 2021-02-16: qty 8.8

## 2021-02-16 MED ORDER — ONDANSETRON HCL 4 MG PO TABS: 4.0000 mg | ORAL_TABLET | ORAL | Status: DC | PRN

## 2021-02-16 MED ORDER — LORAZEPAM 0.5 MG PO TABS
0.5000 mg | ORAL_TABLET | Freq: Every day | ORAL | Status: DC
  Administered 2021-02-17 – 2021-02-21 (×5): 0.5 mg via ORAL
  Filled 2021-02-16 (×5): qty 1

## 2021-02-16 MED ORDER — POLYVINYL ALCOHOL 1.4 % OP SOLN: 1.0000 [drp] | Freq: Every day | OPHTHALMIC | Status: DC | PRN

## 2021-02-16 MED ORDER — LORAZEPAM 1 MG PO TABS
1.0000 mg | ORAL_TABLET | Freq: Two times a day (BID) | ORAL | Status: DC
  Administered 2021-02-16 – 2021-02-22 (×12): 1 mg via ORAL
  Filled 2021-02-16 (×12): qty 1

## 2021-02-16 MED ORDER — ALBUTEROL SULFATE HFA 108 (90 BASE) MCG/ACT IN AERS: 1.0000 | INHALATION_SPRAY | Freq: Four times a day (QID) | RESPIRATORY_TRACT | Status: DC | PRN

## 2021-02-16 MED ORDER — ACETAMINOPHEN 650 MG RE SUPP: 650.0000 mg | RECTAL | Status: DC | PRN

## 2021-02-16 MED ORDER — LEVETIRACETAM 500 MG PO TABS
500.0000 mg | ORAL_TABLET | Freq: Two times a day (BID) | ORAL | Status: DC
  Administered 2021-02-16 – 2021-02-21 (×10): 500 mg via ORAL
  Filled 2021-02-16 (×10): qty 1

## 2021-02-16 MED ORDER — DOCUSATE SODIUM 100 MG PO CAPS
100.0000 mg | ORAL_CAPSULE | Freq: Two times a day (BID) | ORAL | Status: DC
  Administered 2021-02-18 – 2021-02-21 (×6): 100 mg via ORAL
  Filled 2021-02-16 (×11): qty 1

## 2021-02-16 MED ORDER — ACETAMINOPHEN 325 MG PO TABS
650.0000 mg | ORAL_TABLET | ORAL | Status: DC | PRN
  Administered 2021-02-16 – 2021-02-21 (×11): 650 mg via ORAL
  Filled 2021-02-16 (×11): qty 2

## 2021-02-16 MED ORDER — AMLODIPINE BESYLATE 2.5 MG PO TABS
2.5000 mg | ORAL_TABLET | Freq: Every day | ORAL | Status: DC
  Administered 2021-02-17 – 2021-02-22 (×6): 2.5 mg via ORAL
  Filled 2021-02-16 (×6): qty 1

## 2021-02-16 NOTE — H&P (Signed)
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   History taken from chart review and patient.  Patient lives with spouse.  Two-level home bed and bath main level with 2 steps to entry.  Reportedly independent prior to admission caring for his wife who is wheelchair-bound.  He presented on 01/30/2021 with persistent headaches as well as decreased functional mobility.  X-rays imaging revealed right panhemispheric chronic SDH.  Underwent right bur hole for subdural hematoma  On 02/07/2021 per Dr. Christella Noa.  Maintained on Keppra for seizure prophylaxis.  Hospital course episode presyncope associated PVCs identified on telemetry.  Noted blood pressure 60/40 received IV fluid with bolus.  Echocardiogram with ejection fraction of 50-55%, moderately depressed RV systolic function with cardiology service follow-up.  Initially maintained on amiodarone.  CTA of the chest obtained on 02/10/2021 revealed acute bilateral PEs,  a saddle embolus at right pulmonary hilum extending into right upper lobe and right middle lobe as well as multiple small emboli within the right lower lobe, left lower lobe and left upper lobe pulmonary arteries.  Lower extremity Dopplers negative.  Patient was cleared to begin Eliquis for pulmonary emboli after initially being maintained on intravenous heparin.  Patient does have a history of persistent hyponatremia 125-131 and monitored.  Tolerating a regular diet.  Therapy evaluations completed and due to patient's decreased functional mobility recommendations of physical medicine rehab consult the patient was admitted for a comprehensive rehab program.  Please see preadmission assessment from earlier  today as well.  Review of Systems  Constitutional: Negative for chills and fever.  HENT: Negative for hearing loss.   Eyes: Negative for blurred vision and double vision.  Respiratory: Negative for cough.        Occasional shortness of breath with exertion  Cardiovascular: Negative for chest pain, palpitations and leg swelling.  Gastrointestinal: Positive for constipation. Negative for heartburn, nausea and vomiting.       GERD  Genitourinary: Negative for dysuria, flank pain and hematuria.  Musculoskeletal: Positive for joint pain and myalgias.  Skin: Negative for rash.  Neurological: Positive for dizziness and headaches.  Psychiatric/Behavioral: Positive for depression. The patient has insomnia.        Anxiety  All other systems reviewed and are negative.  Past Medical History:  Diagnosis Date  . Allergy   . Anxiety   . Arthritis   . Asthma   . Cancer ([place])    skin  . CHF (congestive heart failure) (HCC)    chornic systolic CHF  . DCM (dilated cardiomyopathy) ([place]) 04/18/2017   ED 45% by [facility] at [facility]  . Depression   . Diverticulosis   . Dysrhythmia   . Eosinophilic esophagitis 6/54/6503   EGD 04/2014 17 Eos/hpf esophageal bxs  . Eye abnormalities    calcium deposits behind eyes  . Frequent PVCs   . GERD (gastroesophageal reflux disease)   . Hx of colonic polyps 12/18/2011  . Hyperlipidemia   . Hypertension   . Hypothyroidism   . SDH (subdural hematoma) ([place]) 11/30/2020  . Stroke [facility])    2 TIAs  . Thyroid disease    hypothyroid   Past Surgical History:  Procedure Laterality Date  . APPENDECTOMY    . BURR HOLE Right 02/07/2021   Procedure: Right  Burr holes for subdural hematoma;  Surgeon: Ashok Pall, MD;  Location: [facility];  Service: Neurosurgery;  Laterality: Right;  Right Burr holes for subdural hematoma  . COLONOSCOPY  10/02/2006, 12/18/2011  . ESOPHAGOSCOPY  12/18/2011   with Maloney dilation  . KNEE ARTHROSCOPY     both knees  . SHOULDER ARTHROSCOPY     left  shoulder, both knees  . SHOULDER SURGERY     right surgery  . TONSILLECTOMY    . UPPER GASTROINTESTINAL ENDOSCOPY  02/04/2009   with Venia Minks dilation   Family History  Problem Relation Age of Onset  . Mesothelioma Paternal Aunt   . Mesothelioma Paternal Uncle   . Colon cancer Neg Hx   . Esophageal cancer Neg Hx   . Stomach cancer Neg Hx   . Rectal cancer Neg Hx    Social History:  reports that he has never smoked. He has never used smokeless tobacco. He reports that he does not drink alcohol and does not use drugs. Allergies: No Known Allergies Medications Prior to Admission  Medication Sig Dispense Refill  . Albuterol Sulfate, sensor, (PROAIR DIGIHALER) 108 (90 Base) MCG/ACT AEPB Inhale 1 puff into the lungs every 6 (six) hours as needed (shortness of breath).    Marland Kitchen alprazolam (XANAX) 2 MG tablet Take 2 mg by mouth at bedtime.    Marland Kitchen amiodarone (PACERONE) 200 MG tablet Take 200 mg by mouth daily.    Marland Kitchen amLODipine (NORVASC) 2.5 MG tablet Take 2.5 mg by mouth daily.    . cyanocobalamin (,VITAMIN B-12,) 1000 MCG/ML injection Inject 1,000 mcg into the muscle See admin instructions. Inject 1 mL (1068mcg) intramuscularly every 30 days as needed for vitamin B12/energy.    Marland Kitchen EPINEPHrine 0.3 mg/0.3 mL IJ SOAJ injection Inject 0.3 mg into the muscle as needed for anaphylaxis.    Marland Kitchen esomeprazole (NEXIUM) 40 MG capsule Take 40 mg by mouth 2 (two) times daily.    . hydrochlorothiazide (HYDRODIURIL) 25 MG tablet Take 25 mg by mouth daily.    Marland Kitchen levothyroxine (SYNTHROID) 112 MCG tablet Take 112 mcg by mouth every morning.    Marland Kitchen LORazepam (ATIVAN) 1 MG tablet Take 0.5-1 mg by mouth See admin instructions. Take 1 tablet (1mg ) by mouth twice daily and 1/2 tablet (0.5mg ) by mouth after lunch.    . metoprolol succinate (TOPROL-XL) 25 MG 24 hr tablet Take 25 mg by mouth 2 (two) times daily.    . Multiple Vitamins-Minerals (MULTIVITAMIN PO) Take 1 tablet by mouth daily.     . pantoprazole (PROTONIX) 40 MG tablet  Take 1 tablet by mouth 2 (two) times daily.    Vladimir Faster Glycol-Propyl Glycol (SYSTANE OP) Place 1 drop into both eyes daily as needed (dry eyes).    . pravastatin (PRAVACHOL) 20 MG tablet Take 20 mg by mouth daily.    . SYMBICORT 160-4.5 MCG/ACT inhaler Inhale 2 puffs into the lungs 2 (two) times daily as needed for shortness of breath.    . telmisartan (MICARDIS) 80 MG tablet Take 80 mg by mouth daily.    Marland Kitchen testosterone cypionate (DEPOTESTOTERONE CYPIONATE) 100 MG/ML injection Inject 200 mg into the muscle every 14 (fourteen) days. For IM use only    . vardenafil (LEVITRA) 20 MG tablet Take 20 mg by mouth daily as needed for erectile dysfunction.    . [DISCONTINUED] Levothyroxine Sodium 112 MCG CAPS Take 100 mcg by mouth daily before breakfast.      Drug Regimen Review Drug regimen was reviewed and remains  appropriate with no significant issues identified  Home: Home Living Family/patient expects to be discharged to:: Private residence Living Arrangements: Spouse/significant other Available Help at Discharge: Family,Available 24 hours/day Type of Home: House Home Access: Stairs to enter,Ramped entrance Entrance Stairs-Number of Steps: 2 (front), 3 (carport) Home Layout: Able to live on main level with bedroom/bathroom Bathroom Shower/Tub: Walk-in shower (lip to step over) Biochemist, clinical: Handicapped height Home Equipment: Other (comment),Grab bars - tub/shower,Grab bars - toilet Additional Comments: hoyer lift for wife (does not use)   Functional History: Prior Function Level of Independence: Independent Comments: fully independence, caring for his w/c level wife (manually lifts pt in and out of w/c) - wife may d/c to [facility] while he is recovering  Functional Status:  Mobility: Bed Mobility Overal bed mobility: Needs Assistance Bed Mobility: Supine to Sit Supine to sit: Min guard Sit to supine: Min guard General bed mobility comments: min guard for  safety Transfers Overall transfer level: Needs assistance Equipment used: Rolling walker (2 wheeled) Transfers: Sit to/from [employer] Sit to Stand: Min guard Stand pivot transfers: Min guard General transfer comment: min guard for safety and verbal cues for hand placement Ambulation/Gait Ambulation/Gait assistance: Min assist Gait Distance (Feet): 95 Feet (then additional 90 feet with RW) Assistive device: Rolling walker (2 wheeled) Gait Pattern/deviations: Step-through pattern,Decreased stride length,Shuffle General Gait Details: mildly unsteady, almost retropulsive, shuffles steps, very short step length. Gait velocity: significantly decreased Gait velocity interpretation: <1.31 ft/sec, indicative of household ambulator    ADL: ADL Overall ADL's : Needs assistance/impaired Eating/Feeding: Supervision/ safety,Set up,Sitting Grooming: Wash/dry hands,Wash/dry face,Oral care,Min guard,Standing Grooming Details (indicate cue type and reason): verbal cues for sequencing and thoroughness Upper Body Bathing: Minimal assistance,Standing Lower Body Bathing: Minimal assistance,Sit to/from stand Upper Body Dressing : Supervision/safety,Set up,Sitting Lower Body Dressing: Minimal assistance,Sit to/from stand Toilet Transfer: Min guard,Ambulation,Comfort height toilet,Grab bars,RW Toilet Transfer Details (indicate cue type and reason): verbal cues for hand placement Functional mobility during ADLs: Minimal assistance,Rolling walker General ADL Comments: Pt requesting to take a sponge bath at sink. Requiring Min A throughout for decreased balance. Pt also decreased cognition and awareness compared to prior sessions.  Cognition: Cognition Overall Cognitive Status: Impaired/Different from baseline Orientation Level: Oriented X4 Cognition Arousal/Alertness: Awake/alert Behavior During Therapy: WFL for tasks assessed/performed Overall Cognitive Status: Impaired/Different from  baseline Area of Impairment: Attention,Memory,Following commands,Awareness,Problem solving,Safety/judgement Current Attention Level: Selective Memory: Decreased short-term memory Following Commands: Follows one step commands consistently,Follows multi-step commands inconsistently Safety/Judgement: Decreased awareness of deficits,Decreased awareness of safety Awareness: Emergent Problem Solving: Requires verbal cues,Requires tactile cues,Difficulty sequencing General Comments: Pt repeats self frequently with no awareness.   He requires cues for problem solving and sequencing as he frequently self distracts  Physical Exam: Blood pressure (!) 146/83, pulse 67, temperature 98.8 F (37.1 C), temperature source Oral, resp. rate 17, height 5\' 10"  (1.778 m), weight 96.7 kg, SpO2 96 %. Physical Exam Vitals reviewed.  Constitutional:      General: He is not in acute distress.    Appearance: He is obese.  HENT:     Head:     Comments: Bur hole site right scalp    Right Ear: External ear normal.     Left Ear: External ear normal.     Nose: Nose normal.  Eyes:     General:        Right eye: No discharge.        Left eye: No discharge.     Extraocular Movements: Extraocular movements  intact.  Cardiovascular:     Rate and Rhythm: Normal rate and regular rhythm.  Pulmonary:     Effort: Pulmonary effort is normal. No respiratory distress.     Breath sounds: Normal breath sounds. No stridor.  Abdominal:     General: Abdomen is flat. Bowel sounds are normal. There is no distension.  Musculoskeletal:     Cervical back: Normal range of motion and neck supple.     Comments: No edema or tenderness in extremities  Skin:    Comments: Right scalp incision site with staples CDI  Neurological:     Mental Status: He is alert.     Comments: Alert Makes eye contact with examiner.   Follows simple commands.   Provides name age and place.   He was not able to recall why he fell in early  December. Motor: 4+/5 throughout  Psychiatric:        Mood and Affect: Mood normal.        Behavior: Behavior normal.     Results for orders placed or performed during the hospital encounter of 02/07/21 (from the past 48 hour(s))  Sodium     Status: Abnormal   Collection Time: 02/14/21  1:36 PM  Result Value Ref Range   Sodium 125 (L) 135 - 145 mmol/L    Comment: Performed at [facility], [place] [address], [place], [place] [address]  CBC     Status: Abnormal   Collection Time: 02/15/21  4:03 AM  Result Value Ref Range   WBC 6.9 4.0 - 10.5 K/uL   RBC 3.31 (L) 4.22 - 5.81 MIL/uL   Hemoglobin 10.6 (L) 13.0 - 17.0 g/dL   HCT 30.2 (L) 39.0 - 52.0 %   MCV 91.2 80.0 - 100.0 fL   MCH 32.0 26.0 - 34.0 pg   MCHC 35.1 30.0 - 36.0 g/dL   RDW 13.7 11.5 - 15.5 %   Platelets 225 150 - 400 K/uL   nRBC 0.0 0.0 - 0.2 %    Comment: Performed at [facility], [place] [address] [place], [place] [address]   No results found.     Medical Problem List and Plan: 1.  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  Status post bur hole 02/07/2021  -patient may not shower  -ELOS/Goals: 7-10 days/mod/supervision  Admit to CIR 2.  Antithrombotics: Bilateral pulmonary emboli identified on CT angiogram of the chest 02/10/2021 -DVT/anticoagulation: Eliquis  -antiplatelet therapy: N/A 3. Pain Management: Hydrocodone as needed  Monitor with increased exertion, particularly for vascular headaches 4. Mood: Xanax 1 mg nightly as needed  -antipsychotic agents: N/A 5. Neuropsych: This patient is capable of making decisions on his own behalf. 6. Skin/Wound Care: Routine skin checks 7. Fluids/Electrolytes/Nutrition: Routine in and outs 8.  Hypotension with frequent PVCs.  Patient cleared to begin low-dose Norvasc 2.5 mg daily as well as home amiodarone 200 mg daily and Toprol 25 mg twice daily  Monitor with increased  exertion 9.  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  Ejection fraction 45%.  Followed at [facility].  Follow-up cardiology services as needed 10.  Seizure prophylaxis.  Keppra 500 mg twice daily. 11.  Hypothyroidism: Synthroid 12.  GERD.  Protonix 13.  Asthma.  Dulera 2 puffs twice daily 14.  Persistent hyponatremia: Continue sodium chloride tablets  CMP ordered for tomorrow a.m.  Lavon Paganini Angiulli, PA-C 02/16/2021  I have personally performed a face  to face diagnostic evaluation, including, but not limited to relevant history and physical exam findings, of this patient and developed relevant assessment and plan.  Additionally, I have reviewed and concur with the physician assistant's documentation above.  Delice Lesch, MD, ABPMR  The patient's status has not changed. Any changes from the pre-admission screening or documentation from the acute chart are noted above.   Delice Lesch, MD, ABPMR

## 2021-02-16 NOTE — Progress Notes (Signed)
Related encounter: Admission (Current) from 02/07/2021 in [place]       Signed      Expand All Collapse All     Show:Clear all [x] Manual[x] Template[] Copied  Added by: [x] Angiulli, Lavon Paganini, PA-C[x] Raulkar, Clide Deutscher, MD   [] Hover for details       Physical Medicine and Rehabilitation Consult Reason for Consult: Persistent headache with decreased functional mobility Referring Physician: Dr. Christella Noa   HPI: John Mack is a 80 y.o. right-handed male with history of diastolic congestive heart failure, dilated cardiomyopathy ejection fraction 45% followed at [facility], hypertension, SDH after fall 11/30/2020.  Per chart review patient lives with spouse.  Two-level home bed and bath main level 2 steps to entry.  Poorly independent prior to admission caring for his wheelchair-bound wife.  Presented 02/07/2021 with persistent headache decreased functional mobility.  X-rays and imaging revealed right panhemispheric chronic subdural hematoma.  Underwent right bur hole for subdural hematoma 02/07/2021 per Dr. Christella Noa.  Maintained on Keppra for seizure prophylaxis.  Hospital course episode presyncope associated PVCs identified on telemetry.  Noted blood pressure 60 over 40s received IV fluid bolus.  Echocardiogram showed moderately depressed RV systolic function with ejection fraction 50 to 55% with cardiology service follow-up.  Currently maintained on amiodarone.  CTA of the chest obtained revealed acute submassive PE with RV/LV of 1.91 and lower extremity Dopplers were negative.  Patient was cleared to begin Eliquis.  Tolerating a regular diet.  Therapy evaluations completed due to patient decreased functional mobility recommendations of physical medicine rehab consult. He was an avid runner at home- all kinds of distances. Cannot recall why he fell.    Review of Systems  Constitutional: Negative for chills and fever.  HENT: Negative for  hearing loss.   Eyes: Negative for blurred vision and double vision.  Respiratory: Negative for cough.        Occasional shortness of breath with exertion  Cardiovascular: Negative for chest pain, palpitations and leg swelling.  Gastrointestinal: Positive for constipation. Negative for heartburn, nausea and vomiting.       GERD  Genitourinary: Negative for dysuria, flank pain and hematuria.  Musculoskeletal: Positive for joint pain and myalgias.  Skin: Negative for rash.  Neurological: Positive for dizziness, weakness and headaches.  Psychiatric/Behavioral: Positive for depression. The patient has insomnia.   All other systems reviewed and are negative.      Past Medical History:  Diagnosis Date   Allergy    Anxiety    Arthritis    Asthma    Cancer ([place])    skin   CHF (congestive heart failure) (HCC)    chornic systolic CHF   DCM (dilated cardiomyopathy) ([place]) 04/18/2017   ED 45% by echo at [facility]   Depression    Diverticulosis    Dysrhythmia    Eosinophilic esophagitis 3/84/6659   EGD 04/2014 17 Eos/hpf esophageal bxs   Eye abnormalities    calcium deposits behind eyes   Frequent PVCs    GERD (gastroesophageal reflux disease)    Hx of colonic polyps 12/18/2011   Hyperlipidemia    Hypertension    Hypothyroidism    SDH (subdural hematoma) ([place]) 11/30/2020   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   Thyroid disease    hypothyroid        Past Surgical History:  Procedure Laterality Date   APPENDECTOMY     BURR HOLE Right 02/07/2021   Procedure: Right Burr holes for subdural hematoma;  Surgeon:  Ashok Pall, MD;  Location: [place];  Service: Neurosurgery;  Laterality: Right;  Right Burr holes for subdural hematoma   COLONOSCOPY  10/02/2006, 12/18/2011   ESOPHAGOSCOPY  12/18/2011   with Venia Minks dilation   KNEE ARTHROSCOPY     both knees   SHOULDER ARTHROSCOPY     left shoulder, both knees   SHOULDER SURGERY     right  surgery   TONSILLECTOMY     UPPER GASTROINTESTINAL ENDOSCOPY  02/04/2009   with Venia Minks dilation        Family History  Problem Relation Age of Onset   Mesothelioma Paternal Aunt    Mesothelioma Paternal Uncle    Colon cancer Neg Hx    Esophageal cancer Neg Hx    Stomach cancer Neg Hx    Rectal cancer Neg Hx    Social History:  reports that he has never smoked. He has never used smokeless tobacco. He reports that he does not drink alcohol and does not use drugs. Allergies: No Known Allergies       Medications Prior to Admission  Medication Sig Dispense Refill   Albuterol Sulfate, sensor, (PROAIR DIGIHALER) 108 (90 Base) MCG/ACT AEPB Inhale 1 puff into the lungs every 6 (six) hours as needed (shortness of breath).     alprazolam (XANAX) 2 MG tablet Take 2 mg by mouth at bedtime.     amiodarone (PACERONE) 200 MG tablet Take 200 mg by mouth daily.     amLODipine (NORVASC) 2.5 MG tablet Take 2.5 mg by mouth daily.     cyanocobalamin (,VITAMIN B-12,) 1000 MCG/ML injection Inject 1,000 mcg into the muscle See admin instructions. Inject 1 mL (1064mcg) intramuscularly every 30 days as needed for vitamin B12/energy.     EPINEPHrine 0.3 mg/0.3 mL IJ SOAJ injection Inject 0.3 mg into the muscle as needed for anaphylaxis.     esomeprazole (NEXIUM) 40 MG capsule Take 40 mg by mouth 2 (two) times daily.     hydrochlorothiazide (HYDRODIURIL) 25 MG tablet Take 25 mg by mouth daily.     levothyroxine (SYNTHROID) 112 MCG tablet Take 112 mcg by mouth every morning.     LORazepam (ATIVAN) 1 MG tablet Take 0.5-1 mg by mouth See admin instructions. Take 1 tablet (1mg ) by mouth twice daily and 1/2 tablet (0.5mg ) by mouth after lunch.     metoprolol succinate (TOPROL-XL) 25 MG 24 hr tablet Take 25 mg by mouth 2 (two) times daily.     Multiple Vitamins-Minerals (MULTIVITAMIN PO) Take 1 tablet by mouth daily.      pantoprazole (PROTONIX) 40 MG tablet Take  1 tablet by mouth 2 (two) times daily.     Polyethyl Glycol-Propyl Glycol (SYSTANE OP) Place 1 drop into both eyes daily as needed (dry eyes).     pravastatin (PRAVACHOL) 20 MG tablet Take 20 mg by mouth daily.     SYMBICORT 160-4.5 MCG/ACT inhaler Inhale 2 puffs into the lungs 2 (two) times daily as needed for shortness of breath.     telmisartan (MICARDIS) 80 MG tablet Take 80 mg by mouth daily.     testosterone cypionate (DEPOTESTOTERONE CYPIONATE) 100 MG/ML injection Inject 200 mg into the muscle every 14 (fourteen) days. For IM use only     vardenafil (LEVITRA) 20 MG tablet Take 20 mg by mouth daily as needed for erectile dysfunction.      Home: Home Living Family/patient expects to be discharged to:: Private residence Living Arrangements: Spouse/significant other Available Help at Discharge: Family,Available 24 hours/day Type  of Home: House Home Access: Stairs to enter,Ramped entrance [employer] of Steps: 2 (front), 3 (carport) Home Layout: Able to live on main level with bedroom/bathroom Bathroom Shower/Tub: Walk-in shower (lip to step over) Biochemist, clinical: Handicapped height Home Equipment: Other (comment),Grab bars - tub/shower,Grab bars - toilet Additional Comments: hoyer lift for wife (does not use)  Functional History: Prior Function Level of Independence: Independent Comments: fully independence, caring for his w/c level wife (manually lifts pt in and out of w/c) - wife may d/c to ST[facility] while he is recovering Functional Status:  Mobility: Bed Mobility Overal bed mobility: Needs Assistance Bed Mobility: Supine to Sit Supine to sit: Min assist,HOB elevated Sit to supine: Total assist,+2 for physical assistance General bed mobility comments: Min A to scoot forward; increased time Transfers Overall transfer level: Needs assistance Equipment used: Rolling walker (2 wheeled) Transfers: Sit to/from Stand Sit to Stand: Min assist General  transfer comment: Performed x 2; required cues for safe hand placement; min A to steady Ambulation/Gait Ambulation/Gait assistance: Min assist Gait Distance (Feet): 100 Feet Assistive device: Rolling walker (2 wheeled) Gait Pattern/deviations: Decreased stride length,Shuffle,Step-to pattern,Decreased stance time - right General Gait Details: Step to R gait with mild instability requiring min A to steady.  Frequent cues for larger steps and RW proximity.  When cued for increased step length he would take 2-3 but then resort back to shuffle Gait velocity: significantly decreased Gait velocity interpretation: <1.31 ft/sec, indicative of household ambulator  ADL: ADL Overall ADL's : Needs assistance/impaired Eating/Feeding: Supervision/ safety,Set up,Sitting Grooming: Minimal assistance,Standing Upper Body Bathing: Supervision/ safety,Set up,Sitting Lower Body Bathing: Minimal assistance,Sit to/from stand Upper Body Dressing : Supervision/safety,Set up,Sitting Lower Body Dressing: Moderate assistance,Sit to/from stand Toilet Transfer: Min guard,Ambulation,RW (simulated to recliner) Armed forces technical officer Details (indicate cue type and reason): Min Guard A for safety Functional mobility during ADLs: Min guard,Rolling walker General ADL Comments: Pt performing mobility with Min Guard A for safety. Reporting feeling uncertain since medical event on Thursday. Pt demonstrating decreased strength and balance but nothing significant compared to PT eval and OT eval. Pt did present this session with memory deficits that were not noted on Thursday.  Cognition: Cognition Overall Cognitive Status: Impaired/Different from baseline Orientation Level: Oriented X4 Cognition Arousal/Alertness: Awake/alert Behavior During Therapy: WFL for tasks assessed/performed Overall Cognitive Status: Impaired/Different from baseline Area of Impairment: Memory,Following commands,Safety/judgement Memory: Decreased  short-term memory Following Commands: Follows one step commands with increased time Safety/Judgement: Decreased awareness of safety,Decreased awareness of deficits General Comments: Multi modal cues thorughout session with decreased carry over at times  Blood pressure (!) 141/88, pulse 90, temperature 99.3 F (37.4 C), temperature source Oral, resp. rate 20, height 5\' 10"  (1.778 m), weight 96.7 kg, SpO2 93 %. Physical Exam Gen: no distress, normal appearing HEENT: oral mucosa pink and moist, NCAT Cardio: Reg rate Chest: normal effort, normal rate of breathing Abd: soft, non-distended Ext: no edema Psych: pleasant, normal affect Skin: intact Neurological:     Comments: Patient is alert and oriented x3  and in no acute distress.  Makes eye contact with examiner.  Provides name and age.  Follows simple commands. 5/5 strength, except for 4/5 in LLE   Lab Results Last 24 Hours       Results for orders placed or performed during the hospital encounter of 02/07/21 (from the past 24 hour(s))  CBC     Status: Abnormal   Collection Time: 02/14/21  1:36 AM  Result Value Ref Range   WBC 7.8  4.0 - 10.5 K/uL   RBC 3.56 (L) 4.22 - 5.81 MIL/uL   Hemoglobin 11.2 (L) 13.0 - 17.0 g/dL   HCT 32.4 (L) 39.0 - 52.0 %   MCV 91.0 80.0 - 100.0 fL   MCH 31.5 26.0 - 34.0 pg   MCHC 34.6 30.0 - 36.0 g/dL   RDW 14.0 11.5 - 15.5 %   Platelets 223 150 - 400 K/uL   nRBC 0.0 0.0 - 0.2 %     Imaging Results (Last 48 hours)  No results found.     Assessment/Plan: Diagnosis: Chronic R SDH s/p burr hole on 2/28 1. Does the need for close, 24 hr/day medical supervision in concert with the patient's rehab needs make it unreasonable for this patient to be served in a less intensive setting? Yes 2. Co-Morbidities requiring supervision/potential complications:  1. Hyponatremia: monitor Na regularly 2. Pre-syncope: monitor closely in therapy 3. Decreasing oxygen saturation: monitor sats  TID 4. Submassive PE: continue heparin 5. Dilated cardiomyopathy 3. Due to bladder management, bowel management, safety, skin/wound care, disease management, medication administration, pain management and patient education, does the patient require 24 hr/day rehab nursing? Yes 4. Does the patient require coordinated care of a physician, rehab nurse, therapy disciplines of PT, OT, SLP to address physical and functional deficits in the context of the above medical diagnosis(es)? Yes Addressing deficits in the following areas: balance, endurance, locomotion, strength, transferring, bowel/bladder control, bathing, dressing, feeding, grooming, toileting, cognition and psychosocial support 5. Can the patient actively participate in an intensive therapy program of at least 3 hrs of therapy per day at least 5 days per week? Yes 6. The potential for patient to make measurable gains while on inpatient rehab is excellent 7. Anticipated functional outcomes upon discharge from inpatient rehab are supervision  with PT, supervision with OT, supervision with SLP. 8. Estimated rehab length of stay to reach the above functional goals is: 5-7 days 9. Anticipated discharge destination: Home 10. Overall Rehab/Functional Prognosis: excellent  RECOMMENDATIONS: This patient's condition is appropriate for continued rehabilitative care in the following setting: CIR Patient has agreed to participate in recommended program. Yes Note that insurance prior authorization may be required for reimbursement for recommended care.  Comment: Thank you for this consult. Admission coordinator to follow.   I have personally performed a face to face diagnostic evaluation, including, but not limited to relevant history and physical exam findings, of this patient and developed relevant assessment and plan.  Additionally, I have reviewed and concur with the physician assistant's documentation above.  Leeroy Cha, MD  Lavon Paganini  [place], PA-C 02/14/2021

## 2021-02-16 NOTE — Progress Notes (Signed)
Pt expressed having anxiety. Requested to get xanax. RN gave ativan per [facility]. RN called pharmacy to confirm that the pt could have the xanax early. Pharmacist approved. RN will monitor the pt for sedation throughout the night. RN notified Linna Hoff, [place] as well. Pt stable at this time. RN will continue to monitor.

## 2021-02-16 NOTE — Progress Notes (Signed)
Inpatient Rehab Admissions Coordinator:  I have a CIR bed for this patient and plan to admit today. RN may call report [phone].  Clemens Catholic, [place], [place] Admissions Coordinator  [phone] ([place]) [phone] (office)

## 2021-02-16 NOTE — TOC Transition Note (Signed)
Transition of Care (TOC) - CM/SW Discharge Note Marvetta Gibbons RN,BSN Transitions of Care Unit 4NP (non trauma) - RN Case Manager See Treatment Team for direct Phone #   Patient Details  Name: John Mack MRN: [id number] Date of Birth: 1941/10/27  Transition of [facility]) CM/SW Contact:  Dawayne Patricia, RN Phone Number: 02/16/2021, 12:38 PM   Clinical Narrative:    Pt stable for transition to [place] rehab, per [facility] - pt has bed available for admit today, pt agreeable.  Plan to admit to [facility] later today. Has been cleared for discharge by MD   Final next level of care: IP Rehab Facility Barriers to Discharge: No Barriers Identified   Patient Goals and CMS Choice Patient states their goals for this hospitalization and ongoing recovery are:: rehab CMS Medicare.gov Compare Post Acute Care list provided to:: Patient Choice offered to / list presented to : Patient  Discharge Placement                 [facility]      Discharge Plan and Services   Discharge Planning Services: CM Consult Post Acute Care Choice: IP Rehab                               Social Determinants of Health (SDOH) Interventions     Readmission Risk Interventions Readmission Risk Prevention Plan 02/16/2021  Post Dischage Appt Not Complete  Appt Comments per MD f/u within 3 wks  Medication Screening Complete  Transportation Screening Complete  Some recent data might be hidden

## 2021-02-16 NOTE — Progress Notes (Signed)
Occupational Therapy Treatment Patient Details Name: John Mack MRN: [id number] DOB: 12-21-1940 Today's Date: 02/16/2021    History of present illness Pt is 80 yo male admitted to [facility] on 2/28, s/p R burr holes and drain placement for chronic R SDH. CTH on 3/2 shows decompressed subdural hemorrhage on the right without complicating features; midline shift has diminished to 4 mm. On 3/3 pt had episode of pre-syncope with PT and decreasing O2 sats with rapid response called. Pt was then found to have submassive PE and started on Heparin. PMH includes anxiety, skin cancer, HF, dilated cardiomyopathy, depression, HLD, HTN, TIAs, arthroscopies L shoulder and B knees.   OT comments  Pt requires min guard - min A for ADLs, and min guard assist for functional transfers.  He requires cues for sequencing, memory, and problem solving.   Recommend CIR   Follow Up Recommendations  CIR    Equipment Recommendations  3 in 1 bedside commode;Other (comment)    Recommendations for Other Services      Precautions / Restrictions Precautions Precautions: Fall       Mobility Bed Mobility Overal bed mobility: Needs Assistance Bed Mobility: Supine to Sit     Supine to sit: Min guard Sit to supine: Min guard   General bed mobility comments: min guard for safety    Transfers Overall transfer level: Needs assistance Equipment used: Rolling walker (2 wheeled) Transfers: Sit to/from [facility] Sit to Stand: Min guard Stand pivot transfers: Min guard       General transfer comment: min guard for safety and verbal cues for hand placement    Balance Overall balance assessment: Needs assistance Sitting-balance support: Feet supported Sitting balance-[name]: Fair     Standing balance support: Bilateral upper extremity supported Standing balance-[name]: Poor Standing balance comment: Reliance on RW for BUE support                           ADL either  performed or assessed with clinical judgement   ADL Overall ADL's : Needs assistance/impaired     Grooming: Wash/dry hands;Wash/dry face;Oral care;Min guard;Standing Grooming Details (indicate cue type and reason): verbal cues for sequencing and thoroughness             Lower Body Dressing: Minimal assistance;Sit to/from stand   Toilet Transfer: Min guard;Ambulation;Comfort height toilet;Grab bars;RW Armed forces technical officer Details (indicate cue type and reason): verbal cues for hand placement         Functional mobility during ADLs: Minimal assistance;Rolling walker       Vision       Perception     Praxis      Cognition Arousal/Alertness: Awake/alert Behavior During Therapy: WFL for tasks assessed/performed Overall Cognitive Status: Impaired/Different from baseline Area of Impairment: Attention;Memory;Following commands;Awareness;Problem solving;Safety/judgement                   Current Attention Level: Selective Memory: Decreased short-term memory Following Commands: Follows one step commands consistently;Follows multi-step commands inconsistently Safety/Judgement: Decreased awareness of deficits;Decreased awareness of safety Awareness: Emergent Problem Solving: Requires verbal cues;Requires tactile cues;Difficulty sequencing General Comments: Pt repeats self frequently with no awareness.   He requires cues for problem solving and sequencing as he frequently self distracts        Exercises     Shoulder Instructions       General Comments VSS    Pertinent Vitals/ Pain       Pain Assessment:  No/denies pain  Home Living                                          Prior Functioning/Environment              Frequency  Min 2X/week        Progress Toward Goals  OT Goals(current goals can now be found in the care plan section)  Progress towards OT goals: Progressing toward goals     Plan Discharge plan needs to be updated     Co-evaluation                 AM-PAC OT "6 Clicks" Daily Activity     Outcome Measure   Help from another person eating meals?: None Help from another person taking care of personal grooming?: A Little Help from another person toileting, which includes using toliet, bedpan, or urinal?: A Little Help from another person bathing (including washing, rinsing, drying)?: A Little Help from another person to put on and taking off regular upper body clothing?: A Little Help from another person to put on and taking off regular lower body clothing?: A Little 6 Click Score: 19    End of Session Equipment Utilized During Treatment: Rolling walker  OT Visit Diagnosis: Unsteadiness on feet (R26.81);Other abnormalities of gait and mobility (R26.89);Muscle weakness (generalized) (M62.81)   Activity Tolerance Patient tolerated treatment well   Patient Left in chair;with call bell/phone within reach;with chair alarm set   Nurse Communication Mobility status        Time: 5732-2025 OT Time Calculation (min): 26 min  Charges: OT General Charges $OT Visit: 1 Visit OT Treatments $Self Care/Home Management : 23-37 mins  John Mack OTR/L Acute Rehabilitation Services Pager [phone] Office [phone]    John Mack 02/16/2021, 9:06 AM

## 2021-02-16 NOTE — PMR Pre-admission (Deleted)
PMR Admission Coordinator Pre-Admission Assessment  Patient: John Mack is an 80 y.o., male MRN: [id number] DOB: 11-03-1941 Height: 5\' 10"  (177.8 cm) Weight: 96.7 kg                                                                                                                                                  Insurance Information HMO:     PPO:      PCP:      IPA:      80/20:      OTHER:  PRIMARY: Medicare Part A and B       Policy#:[id number]      Subscriber: Pt.  CM Name:       Phone#:      Fax#:  Pre-Cert#: verified Civil engineer, contracting:  Benefits:  Phone #:      Name:  Eff. Date: 11/10/08 A and B     Deduct: $1566      Out of Pocket Max: n/a      Life Max: n/a CIR: 100%      SNF: 20 full days Outpatient: 80%     Co-Pay: 20% Home Health: 100%      Co-Pay:  DME: 80%     Co-Pay: 20% Providers: pt choice Providers: not anticipated SECONDARY: [employer]      Policy#: [id number]      Phone#:   Financial Counselor:       Phone#:   The "Data Collection Information Summary" for patients in Inpatient Rehabilitation Facilities with attached "Privacy Act Conception Records" was provided and verbally reviewed with: Patient  Emergency Contact Information         Contact Information    Name Relation Home Work Mobile   [place] [phone]     [place] Sister [phone]  [phone]     Current Medical History  Patient Admitting Diagnosis: SDH s/p Trudee Kuster hole History of Present Illness: John Mack a 80 y.o.right-handed malewith history of diastolic congestive heart failure, dilated cardiomyopathy ejection fraction 45% followed at [facility], hypertension, SDH after fall 11/30/2020. Per chart review patient lives with spouse. Two-level home bed and bath main level 2 steps to entry. Poorly independent prior to admission caring for his wheelchair-bound wife. Presented 02/07/2021 with persistent headache decreased functional  mobility. X-rays and imaging revealed right panhemispheric chronic subdural hematoma. Underwent right bur hole for subdural hematoma 02/07/2021 per Dr. Christella Noa. Maintained on Keppra for seizure prophylaxis. Hospital course episode presyncope associated PVCs identified on telemetry. Noted blood pressure 60 over 40s received IV fluid bolus. Echocardiogram showed moderately depressed RV systolic function with ejection fraction 50 to 55% with cardiology service follow-up. Currently maintained on amiodarone. CTA of the chest obtained revealed acute submassive PE with RV/LV of 1.91 and lower extremity Dopplers were negative. Patient was cleared to  begin Eliquis. Tolerating a regular diet. Therapy evaluations completed due to patient decreased functional mobility recommendations of physical medicine rehab consult. He was an avid runner at home- all kinds of distances. Cannot recall why he fell. Glasgow Coma Scale Score: (!) 20  Past Medical History      Past Medical History:  Diagnosis Date  . Allergy   . Anxiety   . Arthritis   . Asthma   . Cancer ([place])    skin  . CHF (congestive heart failure) (HCC)    chornic systolic CHF  . DCM (dilated cardiomyopathy) ([place]) 04/18/2017   ED 45% by echo at [facility]  . Depression   . Diverticulosis   . Dysrhythmia   . Eosinophilic esophagitis 6/50/3546   EGD 04/2014 17 Eos/hpf esophageal bxs  . Eye abnormalities    calcium deposits behind eyes  . Frequent PVCs   . GERD (gastroesophageal reflux disease)   . Hx of colonic polyps 12/18/2011  . Hyperlipidemia   . Hypertension   . Hypothyroidism   . SDH (subdural hematoma) ([place]) 11/30/2020  . 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 Thyroid disease    hypothyroid    Family History  family history includes Mesothelioma in his paternal aunt and paternal uncle.  Prior Rehab/Hospitalizations:  Has the patient had prior rehab or hospitalizations prior to admission? No  Has the patient had  major surgery during 100 days prior to admission? Yes   Current Medications   Current Facility-Administered Medications:  .  acetaminophen (TYLENOL) tablet 650 mg, 650 mg, Oral, Q4H PRN, 650 mg at 02/15/21 1622 **OR** acetaminophen (TYLENOL) suppository 650 mg, 650 mg, Rectal, Q4H PRN, Ashok Pall, MD .  albuterol (VENTOLIN HFA) 108 (90 Base) MCG/ACT inhaler 1 puff, 1 puff, Inhalation, Q6H PRN, Ashok Pall, MD .  ALPRAZolam Duanne Moron) tablet 1 mg, 1 mg, Oral, QHS PRN, Ashok Pall, MD, 1 mg at 02/16/21 0159 .  amiodarone (PACERONE) tablet 200 mg, 200 mg, Oral, Daily, Ashok Pall, MD, 200 mg at 02/15/21 1017 .  amLODipine (NORVASC) tablet 2.5 mg, 2.5 mg, Oral, Daily, Noemi Chapel P, DO, 2.5 mg at 02/15/21 1017 .  apixaban (ELIQUIS) tablet 10 mg, 10 mg, Oral, BID, 10 mg at 02/15/21 2152 **FOLLOWED BY** [START ON 02/19/2021] apixaban (ELIQUIS) tablet 5 mg, 5 mg, Oral, BID, Carlis Abbott, Paulett Kaufhold P, DO .  ceFAZolin (ANCEF) IVPB 1 g/50 mL premix, 1 g, Intravenous, Q8H, Cabbell, Kyle, MD, Last Rate: 100 mL/hr at 02/16/21 0605, 1 g at 02/16/21 0605 .  Chlorhexidine Gluconate Cloth 2 % PADS 6 each, 6 each, Topical, Daily, Ashok Pall, MD, 6 each at 02/15/21 1217 .  diphenhydrAMINE-zinc acetate (BENADRYL) 2-0.1 % cream, , Topical, TID PRN, Ogan, Okoronkwo U, MD .  docusate sodium (COLACE) capsule 100 mg, 100 mg, Oral, BID, Bowser, Grace E, NP, 100 mg at 02/15/21 2153 .  EPINEPHrine (EPI-PEN) injection 0.3 mg, 0.3 mg, Intramuscular, PRN, Ashok Pall, MD .  HYDROcodone-acetaminophen (NORCO/VICODIN) 5-325 MG per tablet 1 tablet, 1 tablet, Oral, Q4H PRN, Ashok Pall, MD, 1 tablet at 02/16/21 0159 .  labetalol (NORMODYNE) injection 10-40 mg, 10-40 mg, Intravenous, Q10 min PRN, Ashok Pall, MD, 20 mg at 02/07/21 1909 .  levETIRAcetam (KEPPRA) tablet 500 mg, 500 mg, Oral, BID, Ashok Pall, MD, 500 mg at 02/15/21 2152 .  levothyroxine (SYNTHROID) tablet 112 mcg, 112 mcg, Oral, QAC breakfast, Ashok Pall,  MD, 112 mcg at 02/16/21 0533 .  LORazepam (ATIVAN) tablet 0.5 mg, 0.5 mg, Oral, QPC lunch,  Ashok Pall, MD, 0.5 mg at 02/15/21 1217 .  LORazepam (ATIVAN) tablet 1 mg, 1 mg, Oral, BID, Ashok Pall, MD, 1 mg at 02/15/21 2154 .  metoprolol succinate (TOPROL-XL) 24 hr tablet 25 mg, 25 mg, Oral, BID, Julian Hy, DO, 25 mg at 02/15/21 2155 .  mometasone-formoterol (DULERA) 100-5 MCG/ACT inhaler 2 puff, 2 puff, Inhalation, BID, Joselyn Glassman A, RPH, 2 puff at 02/16/21 0806 .  multivitamin with minerals tablet, , Oral, Daily, Ashok Pall, MD, 1 tablet at 02/15/21 1016 .  ondansetron (ZOFRAN) tablet 4 mg, 4 mg, Oral, Q4H PRN, 4 mg at 02/11/21 0752 **OR** ondansetron (ZOFRAN) injection 4 mg, 4 mg, Intravenous, Q4H PRN, Ashok Pall, MD .  pantoprazole (PROTONIX) EC tablet 40 mg, 40 mg, Oral, QHS, Ashok Pall, MD, 40 mg at 02/15/21 2155 .  polyvinyl alcohol (LIQUIFILM TEARS) 1.4 % ophthalmic solution 1 drop, 1 drop, Both Eyes, Daily PRN, Ashok Pall, MD .  promethazine (PHENERGAN) tablet 12.5-25 mg, 12.5-25 mg, Oral, Q4H PRN, Ashok Pall, MD .  senna-docusate (Senokot-S) tablet 1 tablet, 1 tablet, Oral, BID, Bowser, Laurel Dimmer, NP, 1 tablet at 02/15/21 2154 .  sodium chloride tablet 2 g, 2 g, Oral, BID, Ashok Pall, MD, 2 g at 02/15/21 2153  Patients Current Diet:  Diet Order            Diet heart healthy/carb modified Room service appropriate? Yes with Assist; Fluid consistency: Thin  Diet effective now                Precautions / Restrictions Precautions Precautions: Fall Precaution Comments: drain from burr holes, no need to clamp Restrictions Weight Bearing Restrictions: No   Has the patient had 2 or more falls or a fall with injury in the past year?Yes  Prior Activity Level Community (5-7x/wk): Pt. was active in the community PTA  Prior Functional Level Prior Function Level of Independence: Independent Comments: fully independence, caring for his w/c level wife  (manually lifts pt in and out of w/c) - wife may d/c to ST-SNF while he is recovering  Self Care: Did the patient need help bathing, dressing, using the toilet or eating?  Independent  Indoor Mobility: Did the patient need assistance with walking from room to room (with or without device)? Independent  Stairs: Did the patient need assistance with internal or external stairs (with or without device)? Independent  Functional Cognition: Did the patient need help planning regular tasks such as shopping or remembering to take medications? Independent  Home Assistive Devices / [place] Devices/Equipment: Eyeglasses Home Equipment: Other (comment),Grab bars - tub/shower,Grab bars - toilet  Prior Device Use: Indicate devices/aids used by the patient prior to current illness, exacerbation or injury? None of the above  Current Functional Level Cognition  Overall Cognitive Status: Impaired/Different from baseline Orientation Level: Oriented X4 Following Commands: Follows one step commands with increased time Safety/Judgement: Decreased awareness of safety,Decreased awareness of deficits General Comments: Pt perseverating on certain topics (for example, pt repeating that he would like his head shaved throughout session despite cues and conversation that herapist with talk with RN). Pt with poor awarneess of deficits and change in functional status    Extremity Assessment (includes Sensation/Coordination)  Upper Extremity Assessment: Overall WFL for tasks assessed  Lower Extremity Assessment: Defer to PT evaluation    ADLs  Overall ADL's : Needs assistance/impaired Eating/Feeding: Supervision/ safety,Set up,Sitting Grooming: Minimal assistance,Standing Upper Body Bathing: Minimal assistance,Standing Lower Body Bathing: Minimal assistance,Sit to/from stand Upper Body Dressing :  Supervision/safety,Set up,Sitting Lower Body Dressing: Moderate assistance,Sit to/from  stand Toilet Transfer: Ambulation,RW,Minimal assistance (simulated to recliner) Toilet Transfer Details (indicate cue type and reason): Min A for safe descent Functional mobility during ADLs: Minimal assistance,Rolling walker General ADL Comments: Pt requesting to take a sponge bath at sink. Requiring Min A throughout for decreased balance. Pt also decreased cognition and awareness compared to prior sessions.    Mobility  Overal bed mobility: Needs Assistance Bed Mobility: Sit to Supine Supine to sit: Min assist,HOB elevated Sit to supine: Min guard General bed mobility comments: Min Guard A for safety    Transfers  Overall transfer level: Needs assistance Equipment used: Rolling walker (2 wheeled) Transfers: Sit to/from Stand Sit to Stand: Min assist,Min guard General transfer comment: Min Guard A for safety into standing and then Min A for safe descent    Ambulation / Gait / Stairs / Emergency planning/management officer  Ambulation/Gait Ambulation/Gait assistance: Herbalist (Feet): 100 Feet Assistive device: Rolling walker (2 wheeled) Gait Pattern/deviations: Decreased stride length,Shuffle,Step-to pattern,Decreased stance time - right General Gait Details: Step to R gait with mild instability requiring min A to steady.  Frequent cues for larger steps and RW proximity.  When cued for increased step length he would take 2-3 but then resort back to shuffle Gait velocity: significantly decreased Gait velocity interpretation: <1.31 ft/sec, indicative of household ambulator    Posture / Balance Balance Overall balance assessment: Needs assistance Sitting-balance support: Feet supported,Bilateral upper extremity supported,No upper extremity supported Sitting balance-[name]: Fair Standing balance support: Bilateral upper extremity supported Standing balance-[name]: Poor Standing balance comment: Reliance on RW for BUE support    Special needs/care consideration Skin  surgical incision     Previous Home Environment (from acute therapy documentation) Living Arrangements: Spouse/significant other Available Help at Discharge: Family,Available 24 hours/day Type of Home: House Home Layout: Able to live on main level with bedroom/bathroom Home Access: Stairs to enter,Ramped entrance Entrance Stairs-Number of Steps: 2 (front), 3 (carport) Bathroom Shower/Tub: Walk-in shower (lip to step over) Biochemist, clinical: Handicapped height Home Care Services: No Additional Comments: hoyer lift for wife (does not use)  Discharge Living Setting Plans for Discharge Living Setting: Patient's home Type of Home at Discharge: House Discharge Home Layout: One level Discharge Home Access: Stairs to enter,Ramped entrance Entrance Stairs-Rails: Left Entrance Stairs-Number of Steps: 2 Discharge Bathroom Shower/Tub: Walk-in shower Discharge Bathroom Toilet: Handicapped height Discharge Bathroom Accessibility: No Does the patient have any problems obtaining your medications?: No  Social/Family/Support Systems Patient Roles: Other (Comment) Anticipated Caregiver: Daryel November, Private duty RN Caregiver Availability: 24/7 Discharge Plan Discussed with Primary Caregiver: Yes Is Caregiver In Agreement with Plan?: No Does Caregiver/Family have Issues with Lodging/Transportation while Pt is in Rehab?: No   Goals Patient/Family Goal for Rehab: PT/OT Supervision Expected length of stay: 5-7 days Pt/Family Agrees to Admission and willing to participate: Yes Program Orientation Provided & Reviewed with Pt/Caregiver Including Roles  & Responsibilities: Yes   Decrease burden of Care through IP rehab admission: Specialzed equipment needs, Decrease number of caregivers and Patient/family education   Possible need for SNF placement upon discharge:not anticipated   Patient Condition: This patient's condition remains as documented in the consult dated 02/14/21, in which  the Rehabilitation Physician determined and documented that the patient's condition is appropriate for intensive rehabilitative care in an inpatient rehabilitation facility. Will admit to inpatient rehab today.  Preadmission Screen Completed By:  Genella Mech, CCC-SLP, 02/16/2021 10:00 AM ______________________________________________________________________   Discussed  status with Dr. Posey Pronto on 02/16/21 at 63 and received approval for admission today.  Admission Coordinator:  Genella Mech, time 116 Sudie Grumbling 02/16/2021

## 2021-02-16 NOTE — Progress Notes (Signed)
PMR Admission Coordinator Pre-Admission Assessment  Patient: John Mack is an 80 y.o., male MRN: [id number] DOB: 1941/04/28 Height: 5\' 10"  (177.8 cm) Weight: 96.7 kg                                                                                                                                                  Insurance Information HMO:     PPO:      PCP:      IPA:      80/20:      OTHER:  PRIMARY: Medicare Part A and B       Policy#:[id number]      Subscriber: Pt.  CM Name:       Phone#:      Fax#:  Pre-Cert#: verified Civil engineer, contracting:  Benefits:  Phone #:      Name:  Eff. Date: 11/10/08 A and B     Deduct: $1566      Out of Pocket Max: n/a      Life Max: n/a CIR: 100%      SNF: 20 full days Outpatient: 80%     Co-Pay: 20% Home Health: 100%      Co-Pay:  DME: 80%     Co-Pay: 20% Providers: pt choice Providers: not anticipated SECONDARY: [employer]      Policy#: [id number]      Phone#:   Financial Counselor:       Phone#:   The "Data Collection Information Summary" for patients in Inpatient Rehabilitation Facilities with attached "Privacy Act [place] Records" was provided and verbally reviewed with: Patient  Emergency Contact Information         Contact Information    Name Relation Home Work Mobile   John Mack [phone]     [place] Sister [phone]  [phone]     Current Medical History  Patient Admitting Diagnosis: SDH s/p John Mack hole History of Present Illness: John Ingrum Preddyis a 80 y.o.right-handed malewith history of diastolic congestive heart failure, dilated cardiomyopathy ejection fraction 45% followed at [facility], hypertension, SDH after fall 11/30/2020. Per chart review patient lives with spouse. Two-level home bed and bath main level 2 steps to entry. Poorly independent prior to admission caring for his wheelchair-bound wife. Presented 02/07/2021 with persistent headache decreased functional  mobility. X-rays and imaging revealed right panhemispheric chronic subdural hematoma. Underwent right bur hole for subdural hematoma 02/07/2021 per Dr. Christella Noa. Maintained on Keppra for seizure prophylaxis. Hospital course episode presyncope associated PVCs identified on telemetry. Noted blood pressure 60 over 40s received IV fluid bolus. Echocardiogram showed moderately depressed RV systolic function with ejection fraction 50 to 55% with cardiology service follow-up. Currently maintained on amiodarone. CTA of the chest obtained revealed acute submassive PE with RV/LV of 1.91 and lower extremity Dopplers were negative. Patient was cleared to  begin Eliquis. Tolerating a regular diet. Therapy evaluations completed due to patient decreased functional mobility recommendations of physical medicine rehab consult. He was an avid runner at home- all kinds of distances. Cannot recall why he fell. Glasgow Coma Scale Score: (!) 20  Past Medical History      Past Medical History:  Diagnosis Date  . Allergy   . Anxiety   . Arthritis   . Asthma   . Cancer ([place])    skin  . CHF (congestive heart failure) (HCC)    chornic systolic CHF  . DCM (dilated cardiomyopathy) ([place]) 04/18/2017   ED 45% by echo at [facility]  . Depression   . Diverticulosis   . Dysrhythmia   . Eosinophilic esophagitis 06/03/7627   EGD 04/2014 17 Eos/hpf esophageal bxs  . Eye abnormalities    calcium deposits behind eyes  . Frequent PVCs   . GERD (gastroesophageal reflux disease)   . Hx of colonic polyps 12/18/2011  . Hyperlipidemia   . Hypertension   . Hypothyroidism   . SDH (subdural hematoma) ([place]) 11/30/2020  . 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 Thyroid disease    hypothyroid    Family History  family history includes Mesothelioma in his paternal aunt and paternal uncle.  Prior Rehab/Hospitalizations:  Has the patient had prior rehab or hospitalizations prior to admission? No  Has the patient had  major surgery during 100 days prior to admission? Yes  Current Medications   Current Facility-Administered Medications:  .  acetaminophen (TYLENOL) tablet 650 mg, 650 mg, Oral, Q4H PRN, 650 mg at 02/14/21 2037 **OR** acetaminophen (TYLENOL) suppository 650 mg, 650 mg, Rectal, Q4H PRN, Ashok Pall, MD .  albuterol (VENTOLIN HFA) 108 (90 Base) MCG/ACT inhaler 1 puff, 1 puff, Inhalation, Q6H PRN, Ashok Pall, MD .  ALPRAZolam Duanne Moron) tablet 1 mg, 1 mg, Oral, QHS PRN, Ashok Pall, MD, 1 mg at 02/15/21 0324 .  amiodarone (PACERONE) tablet 200 mg, 200 mg, Oral, Daily, Ashok Pall, MD, 200 mg at 02/14/21 0919 .  amLODipine (NORVASC) tablet 2.5 mg, 2.5 mg, Oral, Daily, Julian Hy, DO, 2.5 mg at 02/14/21 0920 .  apixaban (ELIQUIS) tablet 10 mg, 10 mg, Oral, BID, 10 mg at 02/14/21 2041 **FOLLOWED BY** [START ON 02/19/2021] apixaban (ELIQUIS) tablet 5 mg, 5 mg, Oral, BID, Carlis Abbott, Nyilah Kight P, DO .  ceFAZolin (ANCEF) IVPB 1 g/50 mL premix, 1 g, Intravenous, Q8H, Cabbell, Marylyn Ishihara, MD, Last Rate: 100 mL/hr at 02/15/21 0608, 1 g at 02/15/21 0608 .  Chlorhexidine Gluconate Cloth 2 % PADS 6 each, 6 each, Topical, Daily, Ashok Pall, MD, 6 each at 02/14/21 [phone] .  diphenhydrAMINE-zinc acetate (BENADRYL) 2-0.1 % cream, , Topical, TID PRN, Ogan, Okoronkwo U, MD .  docusate sodium (COLACE) capsule 100 mg, 100 mg, Oral, BID, Bowser, Grace E, NP, 100 mg at 02/14/21 2041 .  EPINEPHrine (EPI-PEN) injection 0.3 mg, 0.3 mg, Intramuscular, PRN, Ashok Pall, MD .  HYDROcodone-acetaminophen (NORCO/VICODIN) 5-325 MG per tablet 1 tablet, 1 tablet, Oral, Q4H PRN, Ashok Pall, MD, 1 tablet at 02/15/21 0324 .  labetalol (NORMODYNE) injection 10-40 mg, 10-40 mg, Intravenous, Q10 min PRN, Ashok Pall, MD, 20 mg at 02/07/21 1909 .  levETIRAcetam (KEPPRA) tablet 500 mg, 500 mg, Oral, BID, Ashok Pall, MD, 500 mg at 02/14/21 2039 .  levothyroxine (SYNTHROID) tablet 112 mcg, 112 mcg, Oral, QAC breakfast, Ashok Pall, MD,  112 mcg at 02/15/21 0612 .  LORazepam (ATIVAN) tablet 0.5 mg, 0.5 mg, Oral, QPC lunch, Cabbell,  Marylyn Ishihara, MD, 0.5 mg at 02/14/21 1252 .  LORazepam (ATIVAN) tablet 1 mg, 1 mg, Oral, BID, Ashok Pall, MD, 1 mg at 02/14/21 2039 .  metoprolol succinate (TOPROL-XL) 24 hr tablet 25 mg, 25 mg, Oral, BID, Julian Hy, DO, 25 mg at 02/14/21 2040 .  mometasone-formoterol (DULERA) 100-5 MCG/ACT inhaler 2 puff, 2 puff, Inhalation, BID, Joselyn Glassman A, RPH, 2 puff at 02/15/21 0750 .  multivitamin with minerals tablet, , Oral, Daily, Ashok Pall, MD, 1 tablet at 02/14/21 0919 .  ondansetron (ZOFRAN) tablet 4 mg, 4 mg, Oral, Q4H PRN, 4 mg at 02/11/21 0752 **OR** ondansetron (ZOFRAN) injection 4 mg, 4 mg, Intravenous, Q4H PRN, Ashok Pall, MD .  pantoprazole (PROTONIX) EC tablet 40 mg, 40 mg, Oral, QHS, Ashok Pall, MD, 40 mg at 02/14/21 2042 .  polyvinyl alcohol (LIQUIFILM TEARS) 1.4 % ophthalmic solution 1 drop, 1 drop, Both Eyes, Daily PRN, Ashok Pall, MD .  promethazine (PHENERGAN) tablet 12.5-25 mg, 12.5-25 mg, Oral, Q4H PRN, Ashok Pall, MD .  senna-docusate (Senokot-S) tablet 1 tablet, 1 tablet, Oral, BID, Bowser, Laurel Dimmer, NP, 1 tablet at 02/14/21 2041 .  sodium chloride tablet 2 g, 2 g, Oral, BID, Ashok Pall, MD, 2 g at 02/14/21 2040  Patients Current Diet:     Diet Order                  Diet heart healthy/carb modified Room service appropriate? Yes with Assist; Fluid consistency: Thin  Diet effective now                  Precautions / Restrictions Precautions Precautions: Fall Precaution Comments: drain from burr holes, no need to clamp Restrictions Weight Bearing Restrictions: No   Has the patient had 2 or more falls or a fall with injury in the past year?Yes  Prior Activity Level Community (5-7x/wk): Pt. was active in the community PTA  Prior Functional Level Prior Function Level of Independence: Independent Comments: fully independence, caring for his  w/c level wife (manually lifts pt in and out of w/c) - wife may d/c to ST-SNF while he is recovering  Self Care: Did the patient need help bathing, dressing, using the toilet or eating?  Independent  Indoor Mobility: Did the patient need assistance with walking from room to room (with or without device)? Independent  Stairs: Did the patient need assistance with internal or external stairs (with or without device)? Independent  Functional Cognition: Did the patient need help planning regular tasks such as shopping or remembering to take medications? Independent  Home Assistive Devices / Tropic Devices/Equipment: Eyeglasses Home Equipment: Other (comment),Grab bars - tub/shower,Grab bars - toilet  Prior Device Use: Indicate devices/aids used by the patient prior to current illness, exacerbation or injury? None of the above  Current Functional Level Cognition  Overall Cognitive Status: Impaired/Different from baseline Orientation Level: Oriented X4 Following Commands: Follows one step commands with increased time Safety/Judgement: Decreased awareness of safety,Decreased awareness of deficits General Comments: Pt perseverating on certain topics (for example, pt repeating that he would like his head shaved throughout session despite cues and conversation that herapist with talk with RN). Pt with poor awarneess of deficits and change in functional status    Extremity Assessment (includes Sensation/Coordination)  Upper Extremity Assessment: Overall WFL for tasks assessed  Lower Extremity Assessment: Defer to PT evaluation    ADLs  Overall ADL's : Needs assistance/impaired Eating/Feeding: Supervision/ safety,Set up,Sitting Grooming: Minimal assistance,Standing Upper Body Bathing: Minimal assistance,Standing  Lower Body Bathing: Minimal assistance,Sit to/from stand Upper Body Dressing : Supervision/safety,Set up,Sitting Lower Body Dressing: Moderate  assistance,Sit to/from stand Toilet Transfer: Ambulation,RW,Minimal assistance (simulated to recliner) Toilet Transfer Details (indicate cue type and reason): Min A for safe descent Functional mobility during ADLs: Minimal assistance,Rolling walker General ADL Comments: Pt requesting to take a sponge bath at sink. Requiring Min A throughout for decreased balance. Pt also decreased cognition and awareness compared to prior sessions.    Mobility  Overal bed mobility: Needs Assistance Bed Mobility: Sit to Supine Supine to sit: Min assist,HOB elevated Sit to supine: Min guard General bed mobility comments: Min Guard A for safety    Transfers  Overall transfer level: Needs assistance Equipment used: Rolling walker (2 wheeled) Transfers: Sit to/from Stand Sit to Stand: Min assist,Min guard General transfer comment: Min Guard A for safety into standing and then Min A for safe descent    Ambulation / Gait / Stairs / Emergency planning/management officer  Ambulation/Gait Ambulation/Gait assistance: Herbalist (Feet): 100 Feet Assistive device: Rolling walker (2 wheeled) Gait Pattern/deviations: Decreased stride length,Shuffle,Step-to pattern,Decreased stance time - right General Gait Details: Step to R gait with mild instability requiring min A to steady.  Frequent cues for larger steps and RW proximity.  When cued for increased step length he would take 2-3 but then resort back to shuffle Gait velocity: significantly decreased Gait velocity interpretation: <1.31 ft/sec, indicative of household ambulator    Posture / Balance Balance Overall balance assessment: Needs assistance Sitting-balance support: Feet supported,Bilateral upper extremity supported,No upper extremity supported Sitting balance-[name]: Fair Standing balance support: Bilateral upper extremity supported Standing balance-[name]: Poor Standing balance comment: Reliance on RW for BUE support    Special  needs/care consideration Skin surgical incision     Previous Home Environment (from acute therapy documentation) Living Arrangements: Spouse/significant other Available Help at Discharge: Family,Available 24 hours/day Type of Home: House Home Layout: Able to live on main level with bedroom/bathroom Home Access: Stairs to enter,Ramped entrance Entrance Stairs-Number of Steps: 2 (front), 3 (carport) Bathroom Shower/Tub: Walk-in shower (lip to step over) Biochemist, clinical: Handicapped height Home Care Services: No Additional Comments: hoyer lift for wife (does not use)  Discharge Living Setting Plans for Discharge Living Setting: Patient's home Type of Home at Discharge: House Discharge Home Layout: One level Discharge Home Access: Stairs to enter,Ramped entrance Entrance Stairs-Rails: Left Entrance Stairs-Number of Steps: 2 Discharge Bathroom Shower/Tub: Walk-in shower Discharge Bathroom Toilet: Handicapped height Discharge Bathroom Accessibility: No Does the patient have any problems obtaining your medications?: No  Social/Family/Support Systems Patient Roles: Other (Comment) Anticipated Caregiver: Daryel November, Private duty RN Caregiver Availability: 24/7 Discharge Plan Discussed with Primary Caregiver: Yes Is Caregiver In Agreement with Plan?: No Does Caregiver/Family have Issues with Lodging/Transportation while Pt is in Rehab?: No   Goals Patient/Family Goal for Rehab: PT/OT Supervision Expected length of stay: 5-7 days Pt/Family Agrees to Admission and willing to participate: Yes Program Orientation Provided & Reviewed with Pt/Caregiver Including Roles  & Responsibilities: Yes   Decrease burden of Care through IP rehab admission: Specialzed equipment needs, Decrease number of caregivers and Patient/family education   Possible need for SNF placement upon discharge:not anticipated    Patient Condition: This patient's condition remains as documented in  the consult dated 02/14/21, in which the Rehabilitation Physician determined and documented that the patient's condition is appropriate for intensive rehabilitative care in an inpatient rehabilitation facility. Will admit to inpatient rehab today.  Preadmission Screen Completed By:  Genella Mech, CCC-SLP, 02/15/2021 9:14 AM ______________________________________________________________________   Discussed status with Dr. Dagoberto Ligas on 3/8/22at 1000 and received approval for admission today.  Admission Coordinator:  Genella Mech, time 0034 /Date 3/8/222   Addendum: Patient held yesterday by neurosurgery due to sodium levels, later, per neurosurgery, stable for discharge.  Will plan for admission on 02/16/2021.

## 2021-02-16 NOTE — Progress Notes (Signed)
Inpatient Rehabilitation Medication Review by a Pharmacist  A complete drug regimen review was completed for this patient to identify any potential clinically significant medication issues.  Clinically significant medication issues were identified:  no   Type of Medication Issue Identified Description of Issue Urgent (address now) Non-Urgent (address on AM team rounds) Plan Plan Accepted by Provider? (Yes / No / Pending AM Rounds)  Other  Multiple discrepancies between meds ordered per DC summary and current medications.  However, all current inpatient meds continued on transfer, except Cefazolin (intended to stop on transfer) and Senokot-S BID (on Docusate BID).   N/a N/a N/a   No issues to resolve:  Discrepancies noted below.  Pharmacist comments:   Continued but not on DC summary:     - Keppra, NaCl, Docusate.  Appropriate   DC summary indicates plan to resume, but held for now:    - HCTZ and Telmisartan (inpt substitute Irbesartan) > both stopped on 3/3 due to hypotension, as well as Amlodipine and Toprol.  Toprol added back on 3/5 and amlodipine on 3/6.   - Protonix 40 mg BID and Nexium 40 mg BID > on Protonix 40 mg qhs, appropriate for now.   - Pravastatin 20 mg daily > held due to SDH   - Xanax 2 mg qhs prn > Xanax 1mg  qhs prn as inpt (also on scheduled Lorazepam as PTA and inpatient)   - B-12 monthly, Testosterone q14 days, prn Levitra, epipen > expect resuming after discharge.  Time spent performing this drug regimen review (minutes):  [place], [place], [place] 02/16/2021 7:25 PM

## 2021-02-17 DIAGNOSIS — S065X9A Traumatic subdural hemorrhage with loss of consciousness of unspecified duration, initial encounter: Secondary | ICD-10-CM | POA: Diagnosis not present

## 2021-02-17 LAB — COMPREHENSIVE METABOLIC PANEL
ALT: 30 U/L (ref 0–44)
AST: 46 U/L — ABNORMAL HIGH (ref 15–41)
Albumin: 2.7 g/dL — ABNORMAL LOW (ref 3.5–5.0)
Alkaline Phosphatase: 46 U/L (ref 38–126)
Anion gap: 8 (ref 5–15)
BUN: 8 mg/dL (ref 8–23)
CO2: 26 mmol/L (ref 22–32)
Calcium: 8.5 mg/dL — ABNORMAL LOW (ref 8.9–10.3)
Chloride: 94 mmol/L — ABNORMAL LOW (ref 98–111)
Creatinine, Ser: 0.73 mg/dL (ref 0.61–1.24)
GFR, Estimated: >60 mL/min (ref >60)
Glucose, Bld: 114 mg/dL — ABNORMAL HIGH (ref 70–99)
Potassium: 3.4 mmol/L — ABNORMAL LOW (ref 3.5–5.1)
Sodium: 128 mmol/L — ABNORMAL LOW (ref 135–145)
Total Bilirubin: 0.6 mg/dL (ref 0.3–1.2)
Total Protein: 5.8 g/dL — ABNORMAL LOW (ref 6.5–8.1)

## 2021-02-17 LAB — CBC WITH DIFFERENTIAL/PLATELET
Abs Immature Granulocytes: 0.03 10*3/uL (ref 0.00–0.07)
Basophils Absolute: 0 10*3/uL (ref 0.0–0.1)
Basophils Relative: 1 %
Eosinophils Absolute: 0.3 10*3/uL (ref 0.0–0.5)
Eosinophils Relative: 5 %
HCT: 31 % — ABNORMAL LOW (ref 39.0–52.0)
Hemoglobin: 11.3 g/dL — ABNORMAL LOW (ref 13.0–17.0)
Immature Granulocytes: 1 %
Lymphocytes Relative: 13 %
Lymphs Abs: 0.9 10*3/uL (ref 0.7–4.0)
MCH: 32.4 pg (ref 26.0–34.0)
MCHC: 36.5 g/dL — ABNORMAL HIGH (ref 30.0–36.0)
MCV: 88.8 fL (ref 80.0–100.0)
Monocytes Absolute: 0.7 10*3/uL (ref 0.1–1.0)
Monocytes Relative: 11 %
Neutro Abs: 4.5 10*3/uL (ref 1.7–7.7)
Neutrophils Relative %: 69 %
Platelets: 265 10*3/uL (ref 150–400)
RBC: 3.49 MIL/uL — ABNORMAL LOW (ref 4.22–5.81)
RDW: 13.5 % (ref 11.5–15.5)
WBC: 6.5 10*3/uL (ref 4.0–10.5)
nRBC: 0 % (ref 0.0–0.2)

## 2021-02-17 MED ORDER — HYDROCODONE-ACETAMINOPHEN 5-325 MG PO TABS: 1.0000 | ORAL_TABLET | Freq: Four times a day (QID) | ORAL | Status: DC | PRN

## 2021-02-17 MED ORDER — POTASSIUM CHLORIDE CRYS ER 20 MEQ PO TBCR
40.0000 meq | EXTENDED_RELEASE_TABLET | Freq: Once | ORAL | Status: AC
  Administered 2021-02-17: 40 meq via ORAL
  Filled 2021-02-17: qty 2

## 2021-02-17 NOTE — Progress Notes (Signed)
Inpatient [facility] Individual Statement of Services  Patient Name:  John Mack  Date:  02/17/2021  Welcome to the [facility].  Our goal is to provide you with an individualized program based on your diagnosis and situation, designed to meet your specific needs.  With this comprehensive rehabilitation program, you will be expected to participate in at least 3 hours of rehabilitation therapies Monday-Friday, with modified therapy programming on the weekends.  Your rehabilitation program will include the following services:  Physical Therapy (PT), Occupational Therapy (OT), Speech Therapy (ST), 24 hour per day rehabilitation nursing, Neuropsychology, Care Coordinator, Rehabilitation Medicine, Nutrition Services and Pharmacy Services  Weekly team conferences will be held on Tuesday to discuss your progress.  Your Inpatient Rehabilitation Care Coordinator will talk with you frequently to get your input and to update you on team discussions.  Team conferences with you and your family in attendance may also be held.  Expected length of stay: 7-10 days  Overall anticipated outcome: independent with device  Depending on your progress and recovery, your program may change. Your Inpatient Rehabilitation Care Coordinator will coordinate services and will keep you informed of any changes. Your Inpatient Rehabilitation Care Coordinator's name and contact numbers are listed  below.  The following services may also be recommended but are not provided by the [facility] will be made to provide these services after discharge if needed.  Arrangements include referral to agencies that provide these services.  Your insurance has been verified to be:  [place] primary doctor is:  Ravisankar Avva  Pertinent information  will be shared with your doctor and your insurance company.  Inpatient Rehabilitation Care Coordinator:  Ovidio Kin, [place] or Emilia Beck  Information discussed with and copy given to patient by: [name], 02/17/2021, 12:48 PM

## 2021-02-17 NOTE — Evaluation (Signed)
Occupational Therapy Assessment and Plan  Patient Details  Name: John Mack MRN: [id number] Date of Birth: 03/08/1941  OT Diagnosis: ataxia, muscle weakness (generalized) and BLE weakness Rehab Potential:   ELOS: 7-10 days   Today's Date: 02/17/2021 OT Individual Time: 7867-5449 OT Individual Time Calculation (min): 56 min     Hospital Problem: Principal Problem:   Subdural hematoma ([place])   Past Medical History:  Past Medical History:  Diagnosis Date  . Allergy   . Anxiety   . Arthritis   . Asthma   . Cancer ([place])    skin  . CHF (congestive heart failure) (HCC)    chornic systolic CHF  . DCM (dilated cardiomyopathy) ([place]) 04/18/2017   ED 45% by echo at [facility]  . Depression   . Diverticulosis   . Dysrhythmia   . Eosinophilic esophagitis 01/11/70   EGD 04/2014 17 Eos/hpf esophageal bxs  . Eye abnormalities    calcium deposits behind eyes  . Frequent PVCs   . GERD (gastroesophageal reflux disease)   . Hx of colonic polyps 12/18/2011  . Hyperlipidemia   . Hypertension   . Hypothyroidism   . SDH (subdural hematoma) ([place]) 11/30/2020  . Stroke [facility])    2 TIAs  . Thyroid disease    hypothyroid   Past Surgical History:  Past Surgical History:  Procedure Laterality Date  . APPENDECTOMY    . BURR HOLE Right 02/07/2021   Procedure: Right Burr holes for subdural hematoma;  Surgeon: Ashok Pall, MD;  Location: [place];  Service: Neurosurgery;  Laterality: Right;  Right Burr holes for subdural hematoma  . COLONOSCOPY  10/02/2006, 12/18/2011  . ESOPHAGOSCOPY  12/18/2011   with Maloney dilation  . KNEE ARTHROSCOPY     both knees  . SHOULDER ARTHROSCOPY     left shoulder, both knees  . SHOULDER SURGERY     right surgery  . TONSILLECTOMY    . UPPER GASTROINTESTINAL ENDOSCOPY  02/04/2009   with Venia Minks dilation    Assessment & Plan Clinical Impression: John Mack is a 80 year old right-handed male history of diastolic congestive heart failure, dilated cardiomyopathy  ejection fraction 45% followed at [facility] maintained on amiodarone, hyponatremia, hypertension, SDH after fall 11/30/2021 with conservative care provided.   History taken from chart review and patient.  Patient lives with spouse.  Two-level home bed and bath main level with 2 steps to entry.  Reportedly independent prior to admission caring for his wife who is wheelchair-bound.  He presented on 01/30/2021 with persistent headaches as well as decreased functional mobility.  X-rays imaging revealed right panhemispheric chronic SDH.  Underwent right bur hole for subdural hematoma  On 02/07/2021 per Dr. Christella Noa.  Maintained on Keppra for seizure prophylaxis.  Hospital course episode presyncope associated PVCs identified on telemetry.  Noted blood pressure 60/40 received IV fluid with bolus.  Echocardiogram with ejection fraction of 50-55%, moderately depressed RV systolic function with cardiology service follow-up.  Initially maintained on amiodarone.  CTA of the chest obtained on 02/10/2021 revealed acute bilateral PEs,  a saddle embolus at right pulmonary hilum extending into right upper lobe and right middle lobe as well as multiple small emboli within the right lower lobe, left lower lobe and left upper lobe pulmonary arteries.  Lower extremity Dopplers negative.  Patient was cleared to begin Eliquis for pulmonary emboli after initially being maintained on intravenous heparin.  Patient does have a history of persistent hyponatremia 125-131 and monitored.  Tolerating a regular diet.  Therapy  evaluations completed and due to patient's decreased functional mobility recommendations of physical medicine rehab consult the patient was admitted for a comprehensive rehab program.  Please see preadmission assessment from earlier today as well. Patient transferred to CIR on 02/16/2021 .    Patient currently requires min with basic self-care skills secondary to muscle weakness, decreased cardiorespiratoy endurance,  impaired timing and sequencing, unbalanced muscle activation and ataxia and decreased standing balance, decreased postural control and decreased balance strategies.  Prior to hospitalization, patient could complete BADL, IADLs, and caring for wife with independent .  Patient will benefit from skilled intervention to decrease level of assist with basic self-care skills and increase independence with basic self-care skills prior to discharge home with care partner. (family members - still TBD for specific help). Anticipate patient will require intermittent supervision and follow up home health.  OT - End of Session Activity Tolerance: Tolerates 30+ min activity with multiple rests Endurance Deficit: Yes OT Assessment OT Barriers to Discharge: Decreased caregiver support OT Patient demonstrates impairments in the following area(s): Balance;Cognition;Endurance;Motor;Perception;Safety OT Basic ADL's Functional Problem(s): Grooming;Bathing;Dressing;Toileting OT Transfers Functional Problem(s): Toilet;Tub/Shower OT Additional Impairment(s): None OT Plan OT Intensity: Minimum of 1-2 x/day, 45 to 90 minutes OT Frequency: 5 out of 7 days OT Duration/Estimated Length of Stay: 7-10 days OT Treatment/Interventions: Balance/vestibular training;Discharge planning;Pain management;Self Care/advanced ADL retraining;Therapeutic Activities;UE/LE Coordination activities;Visual/perceptual remediation/compensation;Therapeutic Exercise;Skin care/wound managment;Patient/family education;Functional mobility training;Disease mangement/prevention;Cognitive remediation/compensation;Community reintegration;DME/adaptive equipment instruction;Neuromuscular re-education;Psychosocial support;UE/LE Strength taining/ROM;Wheelchair propulsion/positioning OT Self Feeding Anticipated Outcome(s): no goal OT Basic Self-Care Anticipated Outcome(s): Supervision OT Toileting Anticipated Outcome(s): Mod I OT Bathroom Transfers Anticipated  Outcome(s): Supervision OT Recommendation Patient destination: Home Follow Up Recommendations: Home health OT Equipment Recommended: 3 in 1 bedside comode;To be determined;Tub/shower seat   OT Evaluation Precautions/Restrictions  Precautions Precautions: Fall Restrictions Weight Bearing Restrictions: No Pain Pain Assessment Pain Scale: 0-10 Pain Score: 0-No pain Home Living/Prior Functioning Home Living Family/patient expects to be discharged to:: Private residence Living Arrangements: Spouse/significant other Available Help at Discharge: Family,Available 24 hours/day (family available to Supervise) Type of Home: House Home Access: Stairs to enter,Ramped entrance Entrance Stairs-Number of Steps: ramp, 2 (front), 3 (carport) Home Layout: Able to live on main level with bedroom/bathroom Bathroom Shower/Tub: Multimedia programmer: Handicapped height Additional Comments: hoyer lift for wife (does not use)  Lives With: Spouse Prior Function Level of Independence: Independent with basic ADLs,Independent with homemaking with ambulation,Independent with gait  Able to Take Stairs?: Yes Comments: fully independence, caring for his w/c level wife (manually lifts pt in and out of w/c) - wife may d/c to ST-SNF while he is recovering Vision Baseline Vision/History: Wears glasses Wears Glasses: At all times Patient Visual Report: No change from baseline Vision Assessment?: No apparent visual deficits Visual Fields: No apparent deficits Perception  Perception: Within Functional Limits Praxis Praxis: Intact Cognition Overall Cognitive Status: Within Functional Limits for tasks assessed Arousal/Alertness: Awake/alert Orientation Level: Person;Situation Person: Oriented Situation: Oriented Year: 2022 Month: March Day of Week: Incorrect Memory: Appears intact Immediate Memory Recall: Sock;Blue;Bed Memory Recall Sock: Without Cue Memory Recall Blue: Without Cue Memory  Recall Bed: Not able to recall Attention: Focused;Sustained Focused Attention: Appears intact Sustained Attention: Appears intact (hyperverbal, needs cues to redirect at times) Awareness: Appears intact Problem Solving: Appears intact Safety/Judgment: Appears intact Sensation Sensation Light Touch: Appears Intact Coordination Gross Motor Movements are Fluid and Coordinated: Yes Fine Motor Movements are Fluid and Coordinated: Yes Motor  Motor Motor: Within Functional Limits  Trunk/Postural Assessment  Cervical Assessment  Cervical Assessment: Within Functional Limits Thoracic Assessment Thoracic Assessment: Exceptions to [facility] (mildly rounded shoulders) Lumbar Assessment Lumbar Assessment: Within Functional Limits Postural Control Postural Control: Within Functional Limits  Balance Balance Balance Assessed: Yes Static Sitting Balance Static Sitting - Balance Support: Feet supported Static Sitting - Level of Assistance: 6: Modified independent (Device/Increase time) Dynamic Sitting Balance Dynamic Sitting - Balance Support: Feet supported;During functional activity Dynamic Sitting - Level of Assistance: 5: Stand by assistance Dynamic Sitting - Balance Activities: Lateral lean/weight shifting;Forward lean/weight shifting;Reaching for objects Static Standing Balance Static Standing - Balance Support: During functional activity;Left upper extremity supported Static Standing - Level of Assistance: 5: Stand by assistance Dynamic Standing Balance Dynamic Standing - Balance Support: During functional activity;Bilateral upper extremity supported Dynamic Standing - Level of Assistance: 4: Min assist Dynamic Standing - Balance Activities: Lateral lean/weight shifting;Forward lean/weight shifting;Reaching for objects Extremity/Trunk Assessment RUE Assessment RUE Assessment: Within Functional Limits General Strength Comments: mild weakness from hospitalization, 4 to 4+/5 throughout LUE  Assessment LUE Assessment: Within Functional Limits General Strength Comments: mild weakness from hospitalization, 4 to 4+/5 throughout  Care Tool Care Tool Self Care Eating    Indep    Oral Care    Oral Care Assist Level: Set up assist    Bathing   Body parts bathed by patient: Right arm;Left arm;Chest;Abdomen;Front perineal area;Buttocks;Right upper leg;Left upper leg;Right lower leg;Left lower leg;Face     Assist Level: Minimal Assistance - Patient > 75% (Min A for standing balance during standing for LB bathing)    Upper Body Dressing(including orthotics)   What is the patient wearing?: Pull over shirt   Assist Level: Set up assist    Lower Body Dressing (excluding footwear)   What is the patient wearing?: Pants Assist for lower body dressing: Minimal Assistance - Patient > 75%    Putting on/Taking off footwear      Min A       Care Tool Toileting Toileting activity   Assist for toileting: Minimal Assistance - Patient > 75%     Care Tool Bed Mobility Roll left and right activity    Supervision    Sit to lying activity   Sit to lying assist level: Contact Guard/Touching assist    Lying to sitting edge of bed activity   Lying to sitting edge of bed assist level: Contact Guard/Touching assist     Care Tool Transfers Sit to stand transfer   Sit to stand assist level: Minimal Assistance - Patient > 75%    Chair/bed transfer   Chair/bed transfer assist level: Minimal Assistance - Patient > 75%     Toilet transfer   Assist Level: Minimal Assistance - Patient > 75%     Care Tool Cognition Expression of Ideas and Wants Expression of Ideas and Wants: Without difficulty (complex and basic) - expresses complex messages without difficulty and with speech that is clear and easy to understand   Understanding Verbal and Non-Verbal Content Understanding Verbal and Non-Verbal Content: Usually understands - understands most conversations, but misses some part/intent of  message. Requires cues at times to understand   Memory/Recall Ability *first 3 days only Memory/Recall Ability *first 3 days only: Current season;Location of own room;Staff names and faces;That he or she is in a hospital/hospital unit    Refer to Care Plan for [place] 1 OT Short Term Goal 1 (Week 1): STGs = LTGs d/t ELOS  Recommendations for other services: None    Skilled  Therapeutic Intervention ADL ADL Eating: Not assessed Grooming: Setup Where Assessed-Grooming: Wheelchair;Sitting at sink Upper Body Bathing: Supervision/safety Where Assessed-Upper Body Bathing: Sitting at sink;Wheelchair Lower Body Bathing: Minimal assistance;Contact guard Where Assessed-Lower Body Bathing: Sitting at sink;Standing at sink Upper Body Dressing: Setup Where Assessed-Upper Body Dressing: Wheelchair Lower Body Dressing: Minimal assistance Where Assessed-Lower Body Dressing: Standing at sink;Sitting at sink;Wheelchair Toileting: Minimal Print production planner: Minimal Print production planner Method: Stand pivot Mobility  Bed Mobility Bed Mobility: Rolling Right;Rolling Left;Right Sidelying to Sit Rolling Right: Independent Rolling Left: Independent Right Sidelying to Sit: Contact Guard/Touching assist Transfers Sit to Stand: Minimal Assistance - Patient > 75%;Contact Guard/Touching assist Stand to Sit: Contact Guard/Touching assist    Skilled Intervention: Pt greeted at time of session supine in bed resting, agreeable to OT session and eval. Explained role and purpose of OT and pt agreeable. See above and below for full details.   Therapist initially retrieving wheelchair for session, 18x18 manual wheelchair with standard leg rests. Supine > sit CGA/close supervision and sit > stand Min A and transferred to wheelchair same manner. Set up at sink and performed UB/LB bathing Min/CGA overall with occasional Min A for standing balance during washing buttocks.  Extensive state shaving face as well seated at sink with Supervision, therapist going behind pt for thoroughness. UB dress set up, LB dress CGA/Min with figure four and anterior weight shift. Note pt hyperverbal and required cues for redirecting to tasks throughout. Consistently Min/CGA for transfers. Pt also receiving several phone calls during session. Stand pivot back to bed CGA w/ RW. Alarm on call bell in reach. Discussion throughout session for DC planning and prepping for home.    Discharge Criteria: Patient will be discharged from OT if patient refuses treatment 3 consecutive times without medical reason, if treatment goals not met, if there is a change in medical status, if patient makes no progress towards goals or if patient is discharged from hospital.  The above assessment, treatment plan, treatment alternatives and goals were discussed and mutually agreed upon: by patient  John Mack 02/17/2021, 12:49 PM

## 2021-02-17 NOTE — Progress Notes (Signed)
PROGRESS NOTE   Subjective/Complaints: No complaints this morning Discussed removal of staples on Day 14, Monday, plan for d/c home Tuesday. Patient cares for wife, whom he says is currently in critical care Na up to 128, repeat tomorrow.   ROS: denies pain  Objective:   No results found. Recent Labs    02/15/21 0403 02/17/21 0024  WBC 6.9 6.5  HGB 10.6* 11.3*  HCT 30.2* 31.0*  PLT 225 265   Recent Labs    02/14/21 1336 02/17/21 0024  NA 125* 128*  K  --  3.4*  CL  --  94*  CO2  --  26  GLUCOSE  --  114*  BUN  --  8  CREATININE  --  0.73  CALCIUM  --  8.5*    Intake/Output Summary (Last 24 hours) at 02/17/2021 1152 Last data filed at 02/17/2021 0900 Gross per 24 hour  Intake 355 ml  Output 2601 ml  Net -2246 ml        Physical Exam: Vital Signs Blood pressure 130/71, pulse 74, temperature 100.1 F (37.8 C), temperature source Oral, resp. rate 18, height 5\' 10"  (1.778 m), SpO2 93 %. Gen: no distress, normal appearing HEENT: oral mucosa pink and moist, NCAT Cardio: Reg rate Chest: normal effort, normal rate of breathing Abd: soft, non-distended Ext: no edema Psych: pleasant, normal affect Skin: intact Musculoskeletal:     Cervical back: Normal range of motion and neck supple.     Comments: No edema or tenderness in extremities  Skin:    Comments: Right scalp incision site with staples CDI  Neurological:     Mental Status: He is alert.     Comments: Alert Makes eye contact with examiner.   Follows simple commands.   Provides name age and place.   He was not able to recall why he fell in early December. Motor: 4+/5 throughout  Psychiatric:        Mood and Affect: Mood normal.        Behavior: Behavior normal.   Assessment/Plan: 1. Functional deficits which require 3+ hours per day of interdisciplinary therapy in a comprehensive inpatient rehab setting.  Physiatrist is providing close team  supervision and 24 hour management of active medical problems listed below.  Physiatrist and rehab team continue to assess barriers to discharge/monitor patient progress toward functional and medical goals  Care Tool:  Bathing              Bathing assist       Upper Body Dressing/Undressing Upper body dressing        Upper body assist      Lower Body Dressing/Undressing Lower body dressing            Lower body assist       Toileting Toileting    Toileting assist       Transfers Chair/bed transfer  Transfers assist           Locomotion Ambulation   Ambulation assist              Walk 10 feet activity   Assist           Walk  50 feet activity   Assist           Walk 150 feet activity   Assist           Walk 10 feet on uneven surface  activity   Assist           Wheelchair     Assist               Wheelchair 50 feet with 2 turns activity    Assist            Wheelchair 150 feet activity     Assist          Blood pressure 130/71, pulse 74, temperature 100.1 F (37.8 C), temperature source Oral, resp. rate 18, height 5\' 10"  (1.778 m), SpO2 93 %.    Medical Problem List and Plan: 1.  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  Status post bur hole 02/07/2021             -patient may not shower             -ELOS/Goals: 7-10 days/mod/supervision            Initial CIR evals today 2.  Antithrombotics: Bilateral pulmonary emboli identified on CT angiogram of the chest 02/10/2021 -DVT/anticoagulation: Continue Eliquis             -antiplatelet therapy: N/A 3. Pain Management: Hydrocodone as needed  3/10: wean hydrocodone to q6H prn. Has not required in past 24H             Monitor with increased exertion, particularly for vascular headaches 4. Mood: Xanax 1 mg nightly as needed             -antipsychotic  agents: N/A 5. Neuropsych: This patient is capable of making decisions on his own behalf. 6. Skin/Wound Care: Routine skin checks  3/10: healing well: d/c staples on Monday 7. Fluids/Electrolytes/Nutrition: Routine in and outs 8.  Hypotension with frequent PVCs.  Patient cleared to begin low-dose Norvasc 2.5 mg daily as well as home amiodarone 200 mg daily and Toprol 25 mg twice daily             Monitor with increased exertion 9.  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  Ejection fraction 45%.  Followed at [facility]  Follow-up cardiology services as needed 10.  Seizure prophylaxis.  Keppra 500 mg twice daily. 11.  Hypothyroidism: Synthroid 12.  GERD.  Protonix 13.  Asthma.  Dulera 2 puffs twice daily 14.  Persistent hyponatremia: Continue sodium chloride tablets             3/10: Na up to 128, repeat tomorrow.  15. Disposition: plan for d/c Tuesday- discussed with team. Patient cares for his wife who is in critical care right now. Currently ambulating MinA 95 feet.    LOS: 1 days A FACE TO FACE EVALUATION WAS PERFORMED  Clide Deutscher Raulkar 02/17/2021, 11:52 AM

## 2021-02-17 NOTE — Evaluation (Incomplete)
Physical Therapy Assessment and Plan   Patient Details  Name: John Mack MRN: [id number] Date of Birth: 03-Oct-1941   PT Diagnosis: Abnormality of gait, Difficulty walking and Muscle weakness Rehab Potential: Good ELOS: 7-10    Today's Date: 02/17/2021 PT Individual Time: 1100-1157 PT Individual Time Calculation (min): 57 min     Hospital Problem: Principal Problem:   Subdural hematoma ([place])     Past Medical History:      Past Medical History:  Diagnosis Date  . Allergy    . Anxiety    . Arthritis    . Asthma    . Cancer ([place])      skin  . CHF (congestive heart failure) (HCC)      chornic systolic CHF  . DCM (dilated cardiomyopathy) ([place]) 04/18/2017    ED 45% by echo at [facility]  . Depression    . Diverticulosis    . Dysrhythmia    . Eosinophilic esophagitis 2/44/0102    EGD 04/2014 17 Eos/hpf esophageal bxs  . Eye abnormalities      calcium deposits behind eyes  . Frequent PVCs    . GERD (gastroesophageal reflux disease)    . Hx of colonic polyps 12/18/2011  . Hyperlipidemia    . Hypertension    . Hypothyroidism    . SDH (subdural hematoma) ([place]) 11/30/2020  . Stroke [facility])      2 TIAs  . Thyroid disease      hypothyroid    Past Surgical History:       Past Surgical History:  Procedure Laterality Date  . APPENDECTOMY      . BURR HOLE Right 02/07/2021    Procedure: Right Burr holes for subdural hematoma;  Surgeon: Ashok Pall, MD;  Location: [place];  Service: Neurosurgery;  Laterality: Right;  Right Burr holes for subdural hematoma  . COLONOSCOPY   10/02/2006, 12/18/2011  . ESOPHAGOSCOPY   12/18/2011    with Maloney dilation  . KNEE ARTHROSCOPY        both knees  . SHOULDER ARTHROSCOPY        left shoulder, both knees  . SHOULDER SURGERY        right surgery  . TONSILLECTOMY      . UPPER GASTROINTESTINAL ENDOSCOPY   02/04/2009    with Venia Minks dilation      Assessment & Plan Clinical Impression: Patient is a 80 y.o. year old male with recent admission to  the hospital on with history of diastolic congestive heart failure, dilated cardiomyopathy ejection fraction 45% followed at [facility], hypertension, SDH after fall 11/30/2020.  Per chart review patient lives with spouse.  Two-level home bed and bath main level 2 steps to entry.  Poorly independent prior to admission caring for his wheelchair-bound wife.  Presented 02/07/2021 with persistent headache decreased functional mobility.  X-rays and imaging revealed right panhemispheric chronic subdural hematoma.  Underwent right bur hole for subdural hematoma 02/07/2021 per Dr. Christella Noa.  Maintained on Keppra for seizure prophylaxis.  Hospital course episode presyncope associated PVCs identified on telemetry.  Noted blood pressure 60 over 40s received IV fluid bolus.  Echocardiogram showed moderately depressed RV systolic function with ejection fraction 50 to 55% with cardiology service follow-up.  Currently maintained on amiodarone.  CTA of the chest obtained revealed acute submassive PE with RV/LV of 1.91 and lower extremity Dopplers were negative.  Patient was cleared to begin Eliquis.  Tolerating a regular diet.  Therapy evaluations completed due to patient decreased functional mobility  recommendations of physical medicine rehab consult. He was an avid runner at home- all kinds of distances. Cannot recall why he fell.  Patient transferred to CIR on 02/16/2021 .    Patient currently requires mod with mobility secondary to muscle weakness, decreased cardiorespiratoy endurance and decreased standing balance.  Prior to hospitalization, patient was independent  with mobility and lived with Spouse in a House home.  Home access is ramp, 2 (front), 3 (carport)Stairs to enter,Ramped entrance.   Patient will benefit from skilled PT intervention to maximize safe functional mobility, minimize fall risk and decrease caregiver burden for planned discharge home with intermittent assist.  Anticipate patient will benefit from  follow up [facility] at discharge.   PT - End of Session Activity Tolerance: Tolerates 30+ min activity without fatigue Endurance Deficit: Yes PT Assessment Rehab Potential (ACUTE/IP ONLY): Good PT Barriers to Discharge: Decreased caregiver support;Lack of/limited family support PT Barriers to Discharge Comments: pt does not have over night supervision PT Patient demonstrates impairments in the following area(s): Balance;Endurance;Safety PT Transfers Functional Problem(s): Bed to Chair;Car;Bed Mobility;Furniture PT Locomotion Functional Problem(s): Ambulation;Stairs PT Plan PT Intensity: Minimum of 1-2 x/day ,45 to 90 minutes PT Frequency: 5 out of 7 days PT Duration Estimated Length of Stay: 7-10 PT Treatment/Interventions: Ambulation/gait training;Balance/vestibular training;Community reintegration;Discharge planning;Patient/family education;Therapeutic Activities;Therapeutic Exercise PT Transfers Anticipated Outcome(s): Mod I PT Locomotion Anticipated Outcome(s): Mod I PT Recommendation Follow Up Recommendations: Home health PT Patient destination: Home Equipment Recommended: To be determined   PT Evaluation Precautions/Restrictions Precautions Precautions: Fall Restrictions Weight Bearing Restrictions: No   Pain Pain Assessment Pain Scale: 0-10 Pain Score: 0-No pain   Home Living/Prior Functioning Home Living Living Arrangements: Spouse/significant other Available Help at Discharge: Family;Available 24 hours/day (family available to Supervise) Type of Home: House Home Access: Stairs to enter;Ramped entrance Entrance Stairs-Number of Steps: ramp, 2 (front), 3 (carport) Home Layout: Able to live on main level with bedroom/bathroom Bathroom Shower/Tub: Multimedia programmer: Handicapped height Additional Comments: hoyer lift for wife (does not use)  Lives With: Spouse Prior Function Level of Independence: Independent with basic ADLs;Independent with homemaking with  ambulation;Independent with gait  Able to Take Stairs?: Yes 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, 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 Comments: fully independence, caring for his w/c level wife (manually lifts pt in and out of w/c) - wife may d/c to ST-SNF while he is recovering   Vision/Perception  Vision - History Baseline Vision: Wears glasses all the time Perception Perception: Within Functional Limits   Cognition Overall Cognitive Status: Within Functional Limits for tasks assessed Arousal/Alertness: Awake/alert Orientation Level: Oriented X4 Attention: Focused;Sustained Focused Attention: Appears intact Sustained Attention: Appears intact (hyperverbal, needs cues to redirect at times) Memory: Appears intact Awareness: Appears intact Problem Solving: Appears intact Safety/Judgment: Appears intact   Sensation Sensation Light Touch: Appears Intact Coordination Gross Motor Movements are Fluid and Coordinated: Yes Fine Motor Movements are Fluid and Coordinated: Yes   Motor  Motor Motor: Within Functional Limits    Balance Balance Balance Assessed: Yes Static Sitting Balance Static Sitting - Balance Support: Feet supported Static Sitting - Level of Assistance: 6: Modified independent (Device/Increase time) Dynamic Sitting Balance Dynamic Sitting - Balance Support: Feet supported;During functional activity Dynamic Sitting - Level of Assistance: 5: Stand by assistance Dynamic Sitting Balance - Compensations: retropulsive Dynamic Sitting - Balance Activities: Lateral lean/weight shifting;Forward lean/weight shifting;Reaching for objects Static Standing Balance Static Standing - Balance Support: During functional activity;Left upper extremity supported Static Standing - Level of Assistance: 5: Stand by  assistance Dynamic Standing Balance Dynamic Standing - Balance Support: During functional activity;Bilateral upper extremity supported Dynamic Standing - Level of Assistance: 4: Min  assist Dynamic Standing - Balance Activities: Lateral lean/weight shifting;Forward lean/weight shifting;Reaching for objects Extremity Assessment  RLE Assessment RLE Assessment: Within Functional Limits LLE Assessment LLE Assessment: Exceptions to [facility] LLE Strength LLE Overall Strength Comments: gross 4/5   Care Tool Care Tool Bed Mobility Roll left and right activity Roll left and right assist level: Independent   Sit to lying activity Sit to lying assist level: Contact Guard/Touching assist   Lying to sitting edge of bed activity Lying to sitting edge of bed assist level: Contact Guard/Touching assist     Care Tool Transfers Sit to stand transfer Sit to stand assist level: Minimal Assistance - Patient > 75%   Chair/bed transfer Chair/bed transfer assist level: Minimal Assistance - Patient > 75%    Toilet transfer Assist Level: Minimal Assistance - Patient > 75%   Car transfer Car transfer assist level: Moderate Assistance - Patient 50 - 74%     Care Tool Locomotion Ambulation Assist level: Moderate Assistance - Patient 50 - 74% Assistive device: Hand held assist Max distance: 10  Walk 10 feet activity Assist level: Moderate Assistance - Patient - 50 - 74% Assistive device: Hand held assist    Walk 50 feet with 2 turns activity Walk 50 feet with 2 turns activity did not occur: Safety/medical concerns (fatigue)   Walk 150 feet activity Walk 150 feet activity did not occur: Safety/medical concerns (fatigue)   Walk 10 feet on uneven surfaces activity Assist level: Moderate Assistance - Patient - 50 - 74% Assistive device: Hand held assist  Stairs Stair activity did not occur: Safety/medical concerns (fatigued)   Walk up/down 1 step activity Walk up/down 1 step or curb (drop down) activity did not occur: Safety/medical concerns (fatigue) Walk up/down 4 steps activity did not occuR: Safety/medical concerns (fatigue)  Walk up/down 4 steps activity   Walk up/down 12 steps  activity Walk up/down 12 steps activity did not occur: Safety/medical concerns (fatigue)   Pick up small objects from floor Pick up small object from the floor (from standing position) activity did not occur: Safety/medical concerns (balance deficit)   Wheelchair Will patient use wheelchair at discharge?: No   Wheel 50 feet with 2 turns activity   Wheel 150 feet activity       Refer to Care Plan for Long Term Goals   SHORT TERM GOAL WEEK 1  STG = LTG 2/2 LOS   Recommendations for other services: None    Skilled Therapeutic Intervention Evaluation completed (see details above and below) with education on PT POC and goals and individual treatment initiated with focus on assessment of bed mobility, transfers, balance, ambulation and car transfers. Overall, pt required SBA/CGA for bed mobility, MinA for transfer to WC/recliner, and ModA for amb of 67ft on even and uneven surface and car transfer. Pt was then able to continue ambulation with MinA + RW for 162ft with 1 rest break.    At end of session, pt was left seated in recliner with legs elevated, alarm in place, nurse call bell and all needs in reach. Pt was educated on plan for PT care plan and agreed.     Mobility Bed Mobility Bed Mobility: Rolling Right;Rolling Left;Right Sidelying to Sit Rolling Right: Independent Rolling Left: Independent Right Sidelying to Sit: Contact Guard/Touching assist Transfers Transfers: Sit to Stand;Stand to Sit;Stand Pivot Transfers Sit to Stand: Minimal  Assistance - Patient > 75%;Contact Guard/Touching assist Stand to Sit: Contact Guard/Touching assist Stand Pivot Transfers: Minimal Assistance - Patient > 75% Stand Pivot Transfer Details: Verbal cues for sequencing;Verbal cues for technique;Verbal cues for precautions/safety;Verbal cues for safe use of DME/AE Transfer (Assistive device): Rolling walker Locomotion  Gait Ambulation: Yes Gait Assistance: Minimal Assistance - Patient > 75% Gait  Distance (Feet): 60 Feet Assistive device: Rolling walker Gait Assistance Details: Verbal cues for safe use of DME/AE;Verbal cues for precautions/safety Gait Gait Pattern: Impaired Gait velocity: significantly decreased Stairs / Additional Locomotion Ramp: Moderate Assistance - Patient 50 - 74%     Discharge Criteria: Patient will be discharged from PT if patient refuses treatment 3 consecutive times without medical reason, if treatment goals not met, if there is a change in medical status, if patient makes no progress towards goals or if patient is discharged from hospital.   The above assessment, treatment plan, treatment alternatives and goals were discussed and mutually agreed upon: by patient   Marquette Saa, PT, DPT  Becky Sax PT, DPT  02/17/2021, 1:24 PM

## 2021-02-17 NOTE — Progress Notes (Signed)
Inpatient Rehabilitation  Patient information reviewed and entered into eRehab system by Melissa M. Bowie, M.A., CCC/SLP, PPS Coordinator.  Information including medical coding, functional ability and quality indicators will be reviewed and updated through discharge.    

## 2021-02-17 NOTE — Evaluation (Addendum)
Speech Language Pathology Assessment and Plan  Patient Details  Name: John Mack MRN: [id number] Date of Birth: [birth year]  SLP Diagnosis: Cognitive Impairments  Rehab Potential:  N/A for ST ELOS:   N/A for ST  Today's Date: 02/17/2021 SLP Individual Time: 1300-1350 SLP Individual Time Calculation (min): 50 min   Hospital Problem: Principal Problem:   Subdural hematoma ([place])  Past Medical History:  Past Medical History:  Diagnosis Date  . Allergy   . Anxiety   . Arthritis   . Asthma   . Cancer ([place])    skin  . CHF (congestive heart failure) (HCC)    chornic systolic CHF  . DCM (dilated cardiomyopathy) ([place]) 04/18/2017   ED 45% by echo at [facility]  . Depression   . Diverticulosis   . Dysrhythmia   . Eosinophilic esophagitis 8/41/6606   EGD 04/2014 17 Eos/hpf esophageal bxs  . Eye abnormalities    calcium deposits behind eyes  . Frequent PVCs   . GERD (gastroesophageal reflux disease)   . Hx of colonic polyps 12/18/2011  . Hyperlipidemia   . Hypertension   . Hypothyroidism   . SDH (subdural hematoma) ([place]) 11/30/2020  . Stroke [facility])    2 TIAs  . Thyroid disease    hypothyroid   Past Surgical History:  Past Surgical History:  Procedure Laterality Date  . APPENDECTOMY    . BURR HOLE Right 02/07/2021   Procedure: Right Burr holes for subdural hematoma;  Surgeon: Ashok Pall, MD;  Location: [place];  Service: Neurosurgery;  Laterality: Right;  Right Burr holes for subdural hematoma  . COLONOSCOPY  10/02/2006, 12/18/2011  . ESOPHAGOSCOPY  12/18/2011   with Maloney dilation  . KNEE ARTHROSCOPY     both knees  . SHOULDER ARTHROSCOPY     left shoulder, both knees  . SHOULDER SURGERY     right surgery  . TONSILLECTOMY    . UPPER GASTROINTESTINAL ENDOSCOPY  02/04/2009   with Venia Minks dilation    Assessment / Plan / Recommendation  John Mack is a 80 year old right-handed male history of diastolic congestive heart failure, dilated cardiomyopathy ejection  fraction 45% followed at [facility] maintained on amiodarone, hyponatremia, hypertension, SDH after fall 11/30/2021 with conservative care provided. History taken from chart review and patient. Patient lives with spouse. Two-level home bed and bath main level with 2 steps to entry. Reportedly independent prior to admission caring for his wife who is wheelchair-bound. He presented on 01/30/2021 with persistent headaches as well as decreased functional mobility. X-rays imaging revealed right panhemispheric chronic SDH. Underwent right bur hole for subdural hematoma On 02/07/2021 per Dr. Christella Noa. Maintained on Keppra for seizure prophylaxis. Hospital course episode presyncope associated PVCs identified on telemetry. Noted blood pressure 60/40 received IV fluid with bolus. Echocardiogram with ejection fraction of 50-55%, moderately depressed RV systolic function with cardiology service follow-up. Initially maintained on amiodarone. CTA of the chest obtained on 02/10/2021 revealed acute bilateral PEs, a saddle embolus at right pulmonary hilum extending into right upper lobe and right middle lobe as well as multiple small emboli within the right lower lobe, left lower lobe and left upper lobe pulmonary arteries. Lower extremity Dopplers negative. Patient was cleared to begin Eliquis for pulmonary emboli after initially being maintained on intravenous heparin. Patient does have a history of persistent hyponatremia 125-131 and monitored. Tolerating a regular diet. Therapy evaluations completed and due to patient's decreased functional mobility recommendations of physical medicine rehab consult the patient was admitted for a comprehensive  rehab program. Please see preadmission assessment from earlier today as well. Patient transferred to CIR on 02/16/2021 .    Clinical Impression Patient presents with very mild cognitive impairment impacting his delayed recall but reasoning, problem solving,  awareness all appear intact. He was in average range for Attention, Reasoning, Construction, Orientation, Abstract thinking sections of Cognistat but was in mild impairment range for Memory/Delayed recall. SLP is not recommending any further intervention from [place] as patient appears to be at or near his baseline for cognition; he will have necessary supervision upon discharge. If patient is demonstrating any cognitive difficulty in safety awareness, recall of strategies/precautions during PT and OT, SLP could re-evaluate.   Skilled Therapeutic Interventions          Cognistat and portions of ALFA, cognitive-linguistic assessment  SLP Assessment  Patient does not need any further Speech Lanaguage Pathology Services    Recommendations  Patient destination: Home Follow up Recommendations: None Equipment Recommended: None recommended by SLP    SLP Frequency   N/A  SLP Duration  SLP Intensity  SLP Treatment/Interventions  N/A   N/A    N/A   Pain Pain Assessment Pain Scale: 0-10 Pain Score: 0-No pain Pain Type: Surgical pain Pain Location: Head Pain Orientation: Anterior Pain Descriptors / Indicators: Aching Pain Frequency: Intermittent Pain Onset: On-going Patients Stated Pain Goal: 2 Pain Intervention(s): Medication (See eMAR) Multiple Pain Sites: No  Prior Functioning Cognitive/Linguistic Baseline: Information not available Type of Home: House  Lives With: Spouse Available Help at Discharge: Family;Available 24 hours/day Education: got his Masters degree in Mathematics at age 66 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, 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  SLP Evaluation Cognition Overall Cognitive Status: Within Functional Limits for tasks assessed  Comprehension Auditory Comprehension Overall Auditory Comprehension: Appears within functional limits for tasks assessed Expression Expression Primary Mode of Expression: Verbal Verbal Expression Overall Verbal Expression: Appears  within functional limits for tasks assessed Oral Motor Oral Motor/Sensory Function Overall Oral Motor/Sensory Function: Within functional limits  Care Tool Care Tool Cognition Expression of Ideas and Wants     Understanding Verbal and Non-Verbal Content     Memory/Recall Ability *first 3 days only      Short Term Goals: No short term goals set  Refer to Care Plan for Long Term Goals  Recommendations for other services: Neuropsych  Discharge Criteria: Patient will be discharged from SLP if patient refuses treatment 3 consecutive times without medical reason, if treatment goals not met, if there is a change in medical status, if patient makes no progress towards goals or if patient is discharged from hospital.  The above assessment, treatment plan, treatment alternatives and goals were discussed and mutually agreed upon: by patient  Sonia Baller, MA, CCC-SLP Speech Therapy

## 2021-02-17 NOTE — Progress Notes (Signed)
Inpatient Rehabilitation Care Coordinator Assessment and Plan Patient Details  Name: John Mack MRN: [id number] Date of Birth: [birth year]  Today's Date: 02/17/2021  Hospital Problems: Principal Problem:   Subdural hematoma [facility])  Past Medical History:  Past Medical History:  Diagnosis Date  . Allergy   . Anxiety   . Arthritis   . Asthma   . Cancer ([place])    skin  . CHF (congestive heart failure) (HCC)    chornic systolic CHF  . DCM (dilated cardiomyopathy) ([place]) 04/18/2017   ED 45% by echo at [facility]  . Depression   . Diverticulosis   . Dysrhythmia   . Eosinophilic esophagitis 2/45/8099   EGD 04/2014 17 Eos/hpf esophageal bxs  . Eye abnormalities    calcium deposits behind eyes  . Frequent PVCs   . GERD (gastroesophageal reflux disease)   . Hx of colonic polyps 12/18/2011  . Hyperlipidemia   . Hypertension   . Hypothyroidism   . SDH (subdural hematoma) ([place]) 11/30/2020  . 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 Thyroid disease    hypothyroid   Past Surgical History:  Past Surgical History:  Procedure Laterality Date  . APPENDECTOMY    . BURR HOLE Right 02/07/2021   Procedure: Right Burr holes for subdural hematoma;  Surgeon: Ashok Pall, MD;  Location: [place];  Service: Neurosurgery;  Laterality: Right;  Right Burr holes for subdural hematoma  . COLONOSCOPY  10/02/2006, 12/18/2011  . ESOPHAGOSCOPY  12/18/2011   with Maloney dilation  . KNEE ARTHROSCOPY     both knees  . SHOULDER ARTHROSCOPY     left shoulder, both knees  . SHOULDER SURGERY     right surgery  . TONSILLECTOMY    . UPPER GASTROINTESTINAL ENDOSCOPY  02/04/2009   with Venia Minks dilation   Social History:  reports that he has never smoked. He has never used smokeless tobacco. He reports that he does not drink alcohol and does not use drugs.  Family / Support Systems Marital Status: Married How Long?: 41 years Patient Roles: Spouse,Parent,Caregiver Spouse/Significant Other: Diane [phone] wife is  non-verbal Children: Passenger transport manager in [place] and son in [place] Other Supports: nieghbor and hired retired Rn caring for wife while pt is here in the hospital Anticipated Caregiver: Leann-retired Microbiologist for wife-pt feels can take over once discharged home Ability/Limitations of Caregiver: Leann there short term to help while pt is in the hospital. Caregiver Availability: 24/7 Family Dynamics: Close with family and has been caregiver for wife for the past 12 years. Wife has had multiple CVA's and as a result is wheelchair bound and non-verbal.  Social History Preferred language: English Religion: [employer] Cultural Background: No issues Education: HS Read: Yes Write: Yes Employment Status: Retired Public relations account executive Issues: No issues Guardian/Conservator: None-according to MD pt is capable of making his own decisions while here. He wants to go home ASAP   Abuse/Neglect Abuse/Neglect Assessment Can Be Completed: Yes Physical Abuse: Denies Verbal Abuse: Denies Sexual Abuse: Denies Exploitation of patient/patient's resources: Denies Self-Neglect: Denies  Emotional Status Pt's affect, behavior and adjustment status: Pt is motivated to do well and get home to his wife, he put off seeking treatment after he fell and got worse so he called wife's MD's and came in. He is very committed to wife and will do what is needed to make sure she has what she needs. He feels he can manage and do what is needed for himself even now. Recent Psychosocial Issues: other health issues-caregiver  for wife Psychiatric History: History of depression/anxiety takes medications for this and finds them helpful. Will try to have neuro-psych see if here long enough. MD talking about discharge next Tuesday Substance Abuse History: No issues  Patient / Family Perceptions, Expectations & Goals Pt/Family understanding of illness & functional limitations: Pt can explain his burr hole and blood clots, he feels  he is doing well and will be ready to go home soon. MD told him on Tuesday he can go home.Will make sure he is ready due to plan on reusmng care of wife once gets there. Premorbid pt/family roles/activities: Husband, father, caregiver, retiree, friend, grandparent, etc Anticipated changes in roles/activities/participation: resume Pt/family expectations/goals: Pt states: " I plan on going home Tuesday the doctor feels I will be ready."  [employer]: None Premorbid Home Care/DME Agencies: Other (Comment) (has numerosu pieces of equipment from wife) Transportation available at discharge: Friend/nieghbor Pt was driving prior to admission Resource referrals recommended: Neuropsychology  Discharge Planning Living Arrangements: Spouse/significant other Support Systems: Spouse/significant other,Children,Friends/neighbors,Other (Comment) (hired caregiver) Type of Residence: Private residence Insurance underwriter Resources: [employer] (specify) ([place]) Museum/gallery curator Resources: [employer] Referred: No Living Expenses: Own Money Management: Patient Does the patient have any problems obtaining your medications?: No Home Management: Patient Patient/Family Preliminary Plans: Return home and resume the care of his wife, currently he has a retired Therapist, sports and neighbor taking care of her while he is here. They do have a daughter in [place] who se's on the weekend but works during the week. Pt plans on going home Tuesday 3/15 Care Coordinator Barriers to Discharge: Lack of/limited family support,Other (comments) Care Coordinator Barriers to Discharge Comments: 24/7 caregiver for wheelchair bound wife Care Coordinator Anticipated Follow Up Needs: HH/OP  Clinical Impression Pleasant very talkative gentleman who is doing well considering all that has happened to him. He is his wife's 24/7 caregiver and currently has neighbor and hired retired Therapist, sports  providing care to her while he is here. Will await therapy teams evaluations and work toward discharge. Will see if neuro-psych can see but short length of stay and unsure if will be able too prior to discharge home  [place], Gardiner Rhyme 02/17/2021, 12:46 PM

## 2021-02-17 NOTE — Plan of Care (Signed)
  Problem: RH Balance Goal: LTG: Patient will maintain dynamic sitting balance (OT) Description: LTG:  Patient will maintain dynamic sitting balance with assistance during activities of daily living (OT) Flowsheets (Taken 02/17/2021 1642) LTG: Pt will maintain dynamic sitting balance during ADLs with: Independent Goal: LTG Patient will maintain dynamic standing with ADLs (OT) Description: LTG:  Patient will maintain dynamic standing balance with assist during activities of daily living (OT)  Flowsheets (Taken 02/17/2021 1642) LTG: Pt will maintain dynamic standing balance during ADLs with: Supervision/Verbal cueing   Problem: Sit to Stand Goal: LTG:  Patient will perform sit to stand in prep for activites of daily living with assistance level (OT) Description: LTG:  Patient will perform sit to stand in prep for activites of daily living with assistance level (OT) Flowsheets (Taken 02/17/2021 1642) LTG: PT will perform sit to stand in prep for activites of daily living with assistance level: Supervision/Verbal cueing   Problem: RH Grooming Goal: LTG Patient will perform grooming w/assist,cues/equip (OT) Description: LTG: Patient will perform grooming with assist, with/without cues using equipment (OT) Flowsheets (Taken 02/17/2021 1642) LTG: Pt will perform grooming with assistance level of: Set up assist    Problem: RH Bathing Goal: LTG Patient will bathe all body parts with assist levels (OT) Description: LTG: Patient will bathe all body parts with assist levels (OT) Flowsheets (Taken 02/17/2021 1642) LTG: Pt will perform bathing with assistance level/cueing: Set up assist    Problem: RH Dressing Goal: LTG Patient will perform upper body dressing (OT) Description: LTG Patient will perform upper body dressing with assist, with/without cues (OT). Flowsheets (Taken 02/17/2021 1642) LTG: Pt will perform upper body dressing with assistance level of: Set up assist Goal: LTG Patient will perform  lower body dressing w/assist (OT) Description: LTG: Patient will perform lower body dressing with assist, with/without cues in positioning using equipment (OT) Flowsheets (Taken 02/17/2021 1642) LTG: Pt will perform lower body dressing with assistance level of: Supervision/Verbal cueing   Problem: RH Toileting Goal: LTG Patient will perform toileting task (3/3 steps) with assistance level (OT) Description: LTG: Patient will perform toileting task (3/3 steps) with assistance level (OT)  Flowsheets (Taken 02/17/2021 1642) LTG: Pt will perform toileting task (3/3 steps) with assistance level: Independent with assistive device   Problem: RH Toilet Transfers Goal: LTG Patient will perform toilet transfers w/assist (OT) Description: LTG: Patient will perform toilet transfers with assist, with/without cues using equipment (OT) Flowsheets (Taken 02/17/2021 1642) LTG: Pt will perform toilet transfers with assistance level of: Independent with assistive device   Problem: RH Tub/Shower Transfers Goal: LTG Patient will perform tub/shower transfers w/assist (OT) Description: LTG: Patient will perform tub/shower transfers with assist, with/without cues using equipment (OT) Flowsheets (Taken 02/17/2021 1642) LTG: Pt will perform tub/shower stall transfers with assistance level of: Supervision/Verbal cueing

## 2021-02-17 NOTE — Progress Notes (Addendum)
Physical Therapy Assessment and Plan  Patient Details  Name: John Mack MRN: [id number] Date of Birth: March 07, 1941  PT Diagnosis: Abnormality of gait, Difficulty walking and Muscle weakness Rehab Potential: Good ELOS: 7-10   Today's Date: 02/17/2021 PT Individual Time: 1100-1157 PT Individual Time Calculation (min): 57 min    Hospital Problem: Principal Problem:   Subdural hematoma ([place])   Past Medical History:  Past Medical History:  Diagnosis Date  . Allergy   . Anxiety   . Arthritis   . Asthma   . Cancer ([place])    skin  . CHF (congestive heart failure) (HCC)    chornic systolic CHF  . DCM (dilated cardiomyopathy) ([place]) 04/18/2017   ED 45% by echo at [facility]  . Depression   . Diverticulosis   . Dysrhythmia   . Eosinophilic esophagitis 5/99/3570   EGD 04/2014 17 Eos/hpf esophageal bxs  . Eye abnormalities    calcium deposits behind eyes  . Frequent PVCs   . GERD (gastroesophageal reflux disease)   . Hx of colonic polyps 12/18/2011  . Hyperlipidemia   . Hypertension   . Hypothyroidism   . SDH (subdural hematoma) ([place]) 11/30/2020  . 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 Thyroid disease    hypothyroid   Past Surgical History:  Past Surgical History:  Procedure Laterality Date  . APPENDECTOMY    . BURR HOLE Right 02/07/2021   Procedure: Right Burr holes for subdural hematoma;  Surgeon: Ashok Pall, MD;  Location: [place];  Service: Neurosurgery;  Laterality: Right;  Right Burr holes for subdural hematoma  . COLONOSCOPY  10/02/2006, 12/18/2011  . ESOPHAGOSCOPY  12/18/2011   with Maloney dilation  . KNEE ARTHROSCOPY     both knees  . SHOULDER ARTHROSCOPY     left shoulder, both knees  . SHOULDER SURGERY     right surgery  . TONSILLECTOMY    . UPPER GASTROINTESTINAL ENDOSCOPY  02/04/2009   with Venia Minks dilation    Assessment & Plan Clinical Impression: Patient is a 80 y.o. year old male with recent admission to the hospital on with history of diastolic congestive heart  failure, dilated cardiomyopathy ejection fraction 45% followed at [facility], hypertension, SDH after fall 11/30/2020.  Per chart review patient lives with spouse.  Two-level home bed and bath main level 2 steps to entry.  Poorly independent prior to admission caring for his wheelchair-bound wife.  Presented 02/07/2021 with persistent headache decreased functional mobility.  X-rays and imaging revealed right panhemispheric chronic subdural hematoma.  Underwent right bur hole for subdural hematoma 02/07/2021 per Dr. Christella Noa.  Maintained on Keppra for seizure prophylaxis.  Hospital course episode presyncope associated PVCs identified on telemetry.  Noted blood pressure 60 over 40s received IV fluid bolus.  Echocardiogram showed moderately depressed RV systolic function with ejection fraction 50 to 55% with cardiology service follow-up.  Currently maintained on amiodarone.  CTA of the chest obtained revealed acute submassive PE with RV/LV of 1.91 and lower extremity Dopplers were negative.  Patient was cleared to begin Eliquis.  Tolerating a regular diet.  Therapy evaluations completed due to patient decreased functional mobility recommendations of physical medicine rehab consult. He was an avid runner at home- all kinds of distances. Cannot recall why he fell.  Patient transferred to CIR on 02/16/2021 .   Patient currently requires mod with mobility secondary to muscle weakness, decreased cardiorespiratoy endurance and decreased standing balance.  Prior to hospitalization, patient was independent  with mobility and lived  with Spouse in a House home.  Home access is ramp, 2 (front), 3 (carport)Stairs to enter,Ramped entrance.  Patient will benefit from skilled PT intervention to maximize safe functional mobility, minimize fall risk and decrease caregiver burden for planned discharge home with intermittent assist.  Anticipate patient will benefit from follow up [facility] at discharge.  PT - End of Session Activity  Tolerance: Tolerates 30+ min activity without fatigue Endurance Deficit: Yes PT Assessment Rehab Potential (ACUTE/IP ONLY): Good PT Barriers to Discharge: Decreased caregiver support;Lack of/limited family support PT Barriers to Discharge Comments: pt does not have over night supervision PT Patient demonstrates impairments in the following area(s): Balance;Endurance;Safety PT Transfers Functional Problem(s): Bed to Chair;Car;Bed Mobility;Furniture PT Locomotion Functional Problem(s): Ambulation;Stairs PT Plan PT Intensity: Minimum of 1-2 x/day ,45 to 90 minutes PT Frequency: 5 out of 7 days PT Duration Estimated Length of Stay: 7-10 PT Treatment/Interventions: Ambulation/gait training;Balance/vestibular training;Community reintegration;Discharge planning;Patient/family education;Therapeutic Activities;Therapeutic Exercise PT Transfers Anticipated Outcome(s): Mod I PT Locomotion Anticipated Outcome(s): Mod I PT Recommendation Follow Up Recommendations: Home health PT Patient destination: Home Equipment Recommended: To be determined  PT Evaluation Precautions/Restrictions Precautions Precautions: Fall Restrictions Weight Bearing Restrictions: No  Pain Pain Assessment Pain Scale: 0-10 Pain Score: 0-No pain  Home Living/Prior Functioning Home Living Living Arrangements: Spouse/significant other Available Help at Discharge: Family;Available 24 hours/day (family available to Supervise) Type of Home: House Home Access: Stairs to enter;Ramped entrance Entrance Stairs-Number of Steps: ramp, 2 (front), 3 (carport) Home Layout: Able to live on main level with bedroom/bathroom Bathroom Shower/Tub: Multimedia programmer: Handicapped height Additional Comments: hoyer lift for wife (does not use)  Lives With: Spouse Prior Function Level of Independence: Independent with basic ADLs;Independent with homemaking with ambulation;Independent with gait  Able to Take Stairs?:  Yes 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, 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 Comments: fully independence, caring for his w/c level wife (manually lifts pt in and out of w/c) - wife may d/c to ST-SNF while he is recovering  Vision/Perception  Vision - History Baseline Vision: Wears glasses all the time Perception Perception: Within Functional Limits  Cognition Overall Cognitive Status: Within Functional Limits for tasks assessed Arousal/Alertness: Awake/alert Orientation Level: Oriented X4 Attention: Focused;Sustained Focused Attention: Appears intact Sustained Attention: Appears intact (hyperverbal, needs cues to redirect at times) Memory: Appears intact Awareness: Appears intact Problem Solving: Appears intact Safety/Judgment: Appears intact  Sensation Sensation Light Touch: Appears Intact Coordination Gross Motor Movements are Fluid and Coordinated: Yes Fine Motor Movements are Fluid and Coordinated: Yes  Motor  Motor Motor: Within Functional Limits    Balance Balance Balance Assessed: Yes Static Sitting Balance Static Sitting - Balance Support: Feet supported Static Sitting - Level of Assistance: 6: Modified independent (Device/Increase time) Dynamic Sitting Balance Dynamic Sitting - Balance Support: Feet supported;During functional activity Dynamic Sitting - Level of Assistance: 5: Stand by assistance Dynamic Sitting Balance - Compensations: retropulsive Dynamic Sitting - Balance Activities: Lateral lean/weight shifting;Forward lean/weight shifting;Reaching for objects Static Standing Balance Static Standing - Balance Support: During functional activity;Left upper extremity supported Static Standing - Level of Assistance: 5: Stand by assistance Dynamic Standing Balance Dynamic Standing - Balance Support: During functional activity;Bilateral upper extremity supported Dynamic Standing - Level of Assistance: 4: Min assist Dynamic Standing - Balance Activities: Lateral lean/weight  shifting;Forward lean/weight shifting;Reaching for objects Extremity Assessment  RLE Assessment RLE Assessment: Within Functional Limits LLE Assessment LLE Assessment: Exceptions to [facility] LLE Strength LLE Overall Strength Comments: gross 4/5  Care Tool Care Tool Bed Mobility Roll left  and right activity   Roll left and right assist level: Independent    Sit to lying activity   Sit to lying assist level: Contact Guard/Touching assist    Lying to sitting edge of bed activity   Lying to sitting edge of bed assist level: Contact Guard/Touching assist     Care Tool Transfers Sit to stand transfer   Sit to stand assist level: Minimal Assistance - Patient > 75%    Chair/bed transfer   Chair/bed transfer assist level: Minimal Assistance - Patient > 75%     Toilet transfer   Assist Level: Minimal Assistance - Patient > 75%    Car transfer   Car transfer assist level: Moderate Assistance - Patient 50 - 74%      Care Tool Locomotion Ambulation   Assist level: Moderate Assistance - Patient 50 - 74% Assistive device: Hand held assist Max distance: 10  Walk 10 feet activity   Assist level: Moderate Assistance - Patient - 50 - 74% Assistive device: Hand held assist   Walk 50 feet with 2 turns activity Walk 50 feet with 2 turns activity did not occur: Safety/medical concerns (fatigue)      Walk 150 feet activity Walk 150 feet activity did not occur: Safety/medical concerns (fatigue)      Walk 10 feet on uneven surfaces activity   Assist level: Moderate Assistance - Patient - 50 - 74% Assistive device: Hand held assist  Stairs Stair activity did not occur: Safety/medical concerns (fatigued)        Walk up/down 1 step activity Walk up/down 1 step or curb (drop down) activity did not occur: Safety/medical concerns (fatigue)     Walk up/down 4 steps activity did not occuR: Safety/medical concerns (fatigue)  Walk up/down 4 steps activity      Walk up/down 12 steps activity  Walk up/down 12 steps activity did not occur: Safety/medical concerns (fatigue)      Pick up small objects from floor Pick up small object from the floor (from standing position) activity did not occur: Safety/medical concerns (balance deficit)      Wheelchair Will patient use wheelchair at discharge?: No          Wheel 50 feet with 2 turns activity      Wheel 150 feet activity        Refer to Care Plan for Long Term Goals  SHORT TERM GOAL WEEK 1  STG = LTG 2/2 LOS  Recommendations for other services: None   Skilled Therapeutic Intervention Evaluation completed (see details above and below) with education on PT POC and goals and individual treatment initiated with focus on assessment of bed mobility, transfers, balance, ambulation and car transfers. Overall, pt required SBA/CGA for bed mobility, MinA for transfer to WC/recliner, and ModA for amb of 49f on even and uneven surface and car transfer. Pt was then able to continue ambulation with MinA + RW for 1331fwith 1 rest break.   At end of session, pt was left seated in recliner with legs elevated, alarm in place, nurse call bell and all needs in reach. Pt was educated on plan for PT care plan and agreed.   Mobility Bed Mobility Bed Mobility: Rolling Right;Rolling Left;Right Sidelying to Sit Rolling Right: Independent Rolling Left: Independent Right Sidelying to Sit: Contact Guard/Touching assist Transfers Transfers: Sit to Stand;Stand to Sit;Stand Pivot Transfers Sit to Stand: Minimal Assistance - Patient > 75%;Contact Guard/Touching assist Stand to Sit: Contact Guard/Touching assist Stand Pivot Transfers: Minimal Assistance -  Patient > 75% Stand Pivot Transfer Details: Verbal cues for sequencing;Verbal cues for technique;Verbal cues for precautions/safety;Verbal cues for safe use of DME/AE Transfer (Assistive device): Rolling walker Locomotion  Gait Ambulation: Yes Gait Assistance: Minimal Assistance - Patient >  75% Gait Distance (Feet): 60 Feet Assistive device: Rolling walker Gait Assistance Details: Verbal cues for safe use of DME/AE;Verbal cues for precautions/safety Gait Gait Pattern: Impaired Gait velocity: significantly decreased Stairs / Additional Locomotion Ramp: Moderate Assistance - Patient 50 - 74%   Discharge Criteria: Patient will be discharged from PT if patient refuses treatment 3 consecutive times without medical reason, if treatment goals not met, if there is a change in medical status, if patient makes no progress towards goals or if patient is discharged from hospital.  The above assessment, treatment plan, treatment alternatives and goals were discussed and mutually agreed upon: by patient  Briant Cedar PT, DPT  02/17/2021, 1:24 PM

## 2021-02-18 LAB — BASIC METABOLIC PANEL
Anion gap: 9 (ref 5–15)
BUN: 8 mg/dL (ref 8–23)
CO2: 24 mmol/L (ref 22–32)
Calcium: 8.7 mg/dL — ABNORMAL LOW (ref 8.9–10.3)
Chloride: 96 mmol/L — ABNORMAL LOW (ref 98–111)
Creatinine, Ser: 0.68 mg/dL (ref 0.61–1.24)
GFR, Estimated: >60 mL/min (ref >60)
Glucose, Bld: 153 mg/dL — ABNORMAL HIGH (ref 70–99)
Potassium: 3.4 mmol/L — ABNORMAL LOW (ref 3.5–5.1)
Sodium: 129 mmol/L — ABNORMAL LOW (ref 135–145)

## 2021-02-18 MED ORDER — POTASSIUM CHLORIDE CRYS ER 20 MEQ PO TBCR
40.0000 meq | EXTENDED_RELEASE_TABLET | Freq: Once | ORAL | Status: AC
  Administered 2021-02-18: 40 meq via ORAL
  Filled 2021-02-18: qty 2

## 2021-02-18 MED ORDER — HYDROCODONE-ACETAMINOPHEN 5-325 MG PO TABS
1.0000 | ORAL_TABLET | Freq: Three times a day (TID) | ORAL | Status: DC | PRN
  Administered 2021-02-18 – 2021-02-21 (×4): 1 via ORAL
  Filled 2021-02-18 (×4): qty 1

## 2021-02-18 NOTE — Progress Notes (Signed)
PROGRESS NOTE   Subjective/Complaints: No complaints this morning Reiterated plan for d/c on Tuesday and he is in agreement Discussed plan for staple removal on Monday.  Discussed that Na is slowly improving.   [id number] pain  Objective:   No results found. Recent Labs    02/17/21 0024  WBC 6.5  HGB 11.3*  HCT 31.0*  PLT 265   Recent Labs    02/17/21 0024 02/18/21 0039  NA 128* 129*  K 3.4* 3.4*  CL 94* 96*  CO2 26 24  GLUCOSE 114* 153*  BUN 8 8  CREATININE 0.73 0.68  CALCIUM 8.5* 8.7*    Intake/Output Summary (Last 24 hours) at 02/18/2021 1332 Last data filed at 02/18/2021 1154 Gross per 24 hour  Intake 300 ml  Output 1975 ml  Net -1675 ml        Physical Exam: Vital Signs Blood pressure (!) 152/92, pulse 62, temperature 98.8 F (37.1 C), resp. rate 16, height 5\' 10"  (1.778 m), SpO2 96 %. Gen: no distress, normal appearing HEENT: oral mucosa pink and moist, NCAT Cardio: Reg rate Chest: normal effort, normal rate of breathing Abd: soft, non-distended Ext: no edema Psych: pleasant, normal affect Musculoskeletal:     Cervical back: Normal range of motion and neck supple.     Comments: No edema or tenderness in extremities  Skin:    Comments: Right scalp incision site with staples CDI  Neurological:     Mental Status: He is alert.     Comments: Alert Makes eye contact with examiner.   Follows simple commands.   Provides name age and place.   He was not able to recall why he fell in early December. Motor: 4+/5 throughout  Psychiatric:        Mood and Affect: Mood normal.        Behavior: Behavior normal.   Assessment/Plan: 1. Functional deficits which require 3+ hours per day of interdisciplinary therapy in a comprehensive inpatient rehab setting.  Physiatrist is providing close team supervision and 24 hour management of active medical problems listed below.  Physiatrist and rehab team  continue to assess barriers to discharge/monitor patient progress toward functional and medical goals  Care Tool:  Bathing    Body parts bathed by patient: Right arm,Left arm,Chest,Abdomen,Front perineal area,Buttocks,Right upper leg,Left upper leg,Right lower leg,Left lower leg,Face         Bathing assist Assist Level: Minimal Assistance - Patient > 75% (Min A for standing balance during standing for LB bathing)     Upper Body Dressing/Undressing Upper body dressing   What is the patient wearing?: Pull over shirt    Upper body assist Assist Level: Set up assist    Lower Body Dressing/Undressing Lower body dressing      What is the patient wearing?: Pants,Incontinence brief     Lower body assist Assist for lower body dressing: Moderate Assistance - Patient 50 - 74%     Toileting Toileting    Toileting assist Assist for toileting: Maximal Assistance - Patient 25 - 49%     Transfers Chair/bed transfer  Transfers assist     Chair/bed transfer assist level: Minimal Assistance - Patient > 75%  Locomotion Ambulation   Ambulation assist   Ambulation activity did not occur: Safety/medical concerns  Assist level: Moderate Assistance - Patient 50 - 74% Assistive device: Walker-rolling Max distance: 10   Walk 10 feet activity   Assist     Assist level: Moderate Assistance - Patient - 50 - 74% Assistive device: Hand held assist   Walk 50 feet activity   Assist Walk 50 feet with 2 turns activity did not occur: Safety/medical concerns (fatigue)         Walk 150 feet activity   Assist Walk 150 feet activity did not occur: Safety/medical concerns (fatigue)         Walk 10 feet on uneven surface  activity   Assist     Assist level: Moderate Assistance - Patient - 50 - 74% Assistive device: Hand held assist   Wheelchair     Assist Will patient use wheelchair at discharge?: No             Wheelchair 50 feet with 2 turns  activity    Assist            Wheelchair 150 feet activity     Assist          Blood pressure (!) 152/92, pulse 62, temperature 98.8 F (37.1 C), resp. rate 16, height 5\' 10"  (1.778 m), SpO2 96 %.    Medical Problem List and Plan: 1.  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  Status post bur hole 02/07/2021             -patient may not shower             -ELOS/Goals: 7-10 days/mod/supervision            Continue CIR 2.  Antithrombotics: Bilateral pulmonary emboli identified on CT angiogram of the chest 02/10/2021 -DVT/anticoagulation: Continue Eliquis             -antiplatelet therapy: N/A 3. Pain Management: Hydrocodone as needed  3/10: wean hydrocodone to q6H prn. Has not required in past 24H  3/11: wean Norco to q8H prn.              Monitor with increased exertion, particularly for vascular headaches 4. Mood: Xanax 1 mg nightly as needed             -antipsychotic agents: N/A 5. Neuropsych: This patient is capable of making decisions on his own behalf. 6. Skin/Wound Care: Routine skin checks  3/10: healing well: d/c staples on Monday 7. Fluids/Electrolytes/Nutrition: Routine in and outs 8.  Hypotension with frequent PVCs.  Patient cleared to begin low-dose Norvasc 2.5 mg daily as well as home amiodarone 200 mg daily and Toprol 25 mg twice daily             Monitor with increased exertion 9.  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  Ejection fraction 45%.  Followed at [facility].  Follow-up cardiology services as needed 10.  Seizure prophylaxis.  Keppra 500 mg twice daily. 11.  Hypothyroidism: Synthroid 12.  GERD.  Protonix 13.  Asthma.  Dulera 2 puffs twice daily 14.  Persistent hyponatremia: Continue sodium chloride tablets             3/11: Na up to 129, repeat tomorrow. 15. Hypokalemia:  3/11: K+ down to 3.4, supplement with 32meq and repeat  tomorrow. 15. Disposition: plan for d/c Tuesday- discussed with team. Patient cares for his wife who  is in critical care right now. Currently ambulating 130 feet.    LOS: 2 days A FACE TO FACE EVALUATION WAS PERFORMED  Charlies Rayburn P Ronie Barnhart 02/18/2021, 1:32 PM

## 2021-02-18 NOTE — Progress Notes (Signed)
Physical Therapy Session Note  Patient Details  Name: John Mack MRN: [id number] Date of Birth: [birth year]  Today's Date: 02/18/2021 PT Individual Time: 9702-6378 PT Individual Time Calculation (min): 58 min   Short Term Goals: Week 1:  PT Short Term Goal 1 (Week 1): STG=LTG due to LOS  Skilled Therapeutic Interventions/Progress Updates:     Chart reviewed and pt agreeable to therapy. Pt received semi-reclined in bed with no c/o pain.   Session focused on dynamic standing balance, functional transfers, ambulation endurance and stair navigation  Pt initiated session with transfer to EOB with SBA and then WC using CGA and RW. Pt then completed 52ft amb with CGA + RW followed by 4 steps with MinA and B rails.   Pt progressed to dynamic standing activities including 3x60secs of standing on foam with BUE support on RW + MinA, alt unilateral UE support on RW + MinA, and hands free with MinA. Pt also practiced hands-free tandem stance with eyes open and closed with MinA. Of note, pt displayed slight retropulsion on all exercises but was able to recover with VCs.   Pt then completed 132ft amb with CGA + RW declining to MinA + RW with fatigue. Pt required VCs for increased step length. Pt returned to bed with CGA + RW.  Session education emphasized importance of balance training before intended discharge date. Pt was updated on and agreed to continued care plan that includes strong weekend PT to prepare for express discharge date of Tuesday.  At end of session, pt was left seated EOB with alarm engaged, nurse call bell, all needs in reach and CNA entering room to assist with toileting per pt request.  Therapy Documentation Precautions:  Precautions Precautions: Fall Restrictions Weight Bearing Restrictions: No     Therapy/Group: [place], PT, DPT 02/18/2021, 12:55 PM

## 2021-02-18 NOTE — Progress Notes (Signed)
Occupational Therapy Session Note  Patient Details  Name: John Mack MRN: [id number] Date of Birth: 1941-07-12  Today's Date: 02/18/2021 OT Individual Time: 0932-6712 and 4580-9983 OT Individual Time Calculation (min): 60 min and 24 min   Short Term Goals: Week 1:  OT Short Term Goal 1 (Week 1): STGs = LTGs d/t ELOS  Skilled Therapeutic Interventions/Progress Updates:   1) Treatment session with focus on functional transfers, dynamic standing balance, and overall endurance.  Pt received semi-reclined in bed agreeable to therapy session.  Pt reports need to void requesting to use urinal at EOB.  Pt completed bed mobility with min assist to come to sitting EOB.  Min assist sit >stand for clothing management with therapist assisting due to urgency.  Pt able to complete 50% of pulling pants back up after voiding.  Pt then reports need to have BM.  Completed stand pivot transfer to [facility] with RW with increased time due to small shuffling steps and min assist.  Pt with success void of bowel and bladder while on BSC.  Therapist assisted with hygiene and pt able to pull pants over hips this attempt.  Transferred stand pivot to w/c and transported to therapy gym for time.  Engaged in table top task in standing with focus on BLE strength and endurance while engage in Checkers activity.  Pt tolerated standing 10-12 mins without report of leg fatigue or instability while completing table top task.  Pt returned to room and transferred back to bed Min assist stand pivot with RW.  Pt required frequent redirection as he is quite verbose and requires cues to attend to task.  Pt remained semi-reclined in bed with all needs in reach.  2) Treatment session with focus on sit > stand and dynamic standing balance.  Pt initially received in bed on the phone asking for time to rest as having just recently returned to bed and needing to finish phone call.  Therapist returned at 1500.  Pt completed bed mobility with CGA  and stand pivot transfer with RW with increased time.  Engaged in horse shoe toss in standing with intermittent UE support.  Pt completed all sit > stand CGA to close supervision and maintained dynamic standing when tossing horse shoes with CGA.  Pt returned to bed CGA and returned to supine due to fatigue.  Pt reports desire to d/c home Tues, MD aware.  Therapy Documentation Precautions:  Precautions Precautions: Fall Restrictions Weight Bearing Restrictions: No General:   Vital Signs: Oxygen Therapy SpO2: 96 % O2 Device: Room Air Pain: Pain Assessment Pain Scale: 0-10 Pain Score: 0-No pain   Therapy/Group: Individual Therapy  Simonne Come 02/18/2021, 10:56 AM

## 2021-02-19 DIAGNOSIS — G441 Vascular headache, not elsewhere classified: Secondary | ICD-10-CM

## 2021-02-19 DIAGNOSIS — E871 Hypo-osmolality and hyponatremia: Secondary | ICD-10-CM

## 2021-02-19 DIAGNOSIS — I10 Essential (primary) hypertension: Secondary | ICD-10-CM

## 2021-02-19 DIAGNOSIS — E876 Hypokalemia: Secondary | ICD-10-CM

## 2021-02-19 LAB — BASIC METABOLIC PANEL
Anion gap: 8 (ref 5–15)
BUN: 7 mg/dL — ABNORMAL LOW (ref 8–23)
CO2: 24 mmol/L (ref 22–32)
Calcium: 8.7 mg/dL — ABNORMAL LOW (ref 8.9–10.3)
Chloride: 98 mmol/L (ref 98–111)
Creatinine, Ser: 0.71 mg/dL (ref 0.61–1.24)
GFR, Estimated: >60 mL/min (ref >60)
Glucose, Bld: 112 mg/dL — ABNORMAL HIGH (ref 70–99)
Potassium: 4 mmol/L (ref 3.5–5.1)
Sodium: 130 mmol/L — ABNORMAL LOW (ref 135–145)

## 2021-02-19 NOTE — IPOC Note (Signed)
Individualized overall Plan of Care [facility]) Patient Details Name: John Mack MRN: [id number] DOB: 07-Jan-1941  Admitting Diagnosis: Subdural hematoma [facility])  Hospital Problems: Principal Problem:   Subdural hematoma ([place])     Functional Problem List: Nursing Behavior,Bladder,Bowel,Edema,Endurance,Medication Management,Pain,Perception,Safety,Skin Integrity  PT Balance,Endurance,Safety  OT Balance,Cognition,Endurance,Motor,Perception,Safety  SLP    TR         Basic ADL's: OT Grooming,Bathing,Dressing,Toileting     Advanced  ADL's: OT       Transfers: PT Bed to Chair,Car,Bed Mobility,Furniture  OT Toilet,Tub/Shower     Locomotion: PT Ambulation,Stairs     Additional Impairments: OT None  SLP None      TR      Anticipated Outcomes Item Anticipated Outcome  Self Feeding no goal  Swallowing      Basic self-care  Supervision  Toileting  Mod I   Bathroom Transfers Supervision  Bowel/Bladder  Supervision  Transfers  Mod I  Locomotion  Mod I  Communication     Cognition     Pain  <3  Safety/Judgment  Supervision   Therapy Plan: PT Intensity: Minimum of 1-2 x/day ,45 to 90 minutes PT Frequency: 5 out of 7 days PT Duration Estimated Length of Stay: 7-10 OT Intensity: Minimum of 1-2 x/day, 45 to 90 minutes OT Frequency: 5 out of 7 days OT Duration/Estimated Length of Stay: 7-10 days      Team Interventions: Nursing Interventions Patient/Family Education,Bladder [place] Care/Wound Management,Discharge Planning,Psychosocial Support  PT interventions Ambulation/gait training,Balance/vestibular training,Community reintegration,Discharge planning,Patient/family education,Therapeutic Activities,Therapeutic Exercise  OT Interventions Balance/vestibular training,Discharge planning,Pain management,Self Care/advanced ADL retraining,Therapeutic Activities,UE/LE Coordination  activities,Visual/perceptual remediation/compensation,Therapeutic Exercise,Skin care/wound managment,Patient/family education,Functional mobility training,Disease mangement/prevention,Cognitive remediation/compensation,Community Corporate treasurer re-education,Psychosocial support,UE/LE Strength taining/ROM,Wheelchair propulsion/positioning  SLP Interventions    TR Interventions    SW/CM Interventions Discharge Planning,Psychosocial Support,Patient/Family Education   Barriers to Discharge MD  Medical stability  Nursing Inaccessible home environment,Decreased caregiver support,Home environment access/layout,Incontinence,Wound Care,Lack of/limited family support,Weight,Medication compliance,Behavior    PT Decreased caregiver support,Lack of/limited family support pt does not have over night supervision  OT Decreased caregiver support    SLP      SW Lack of/limited family support,Other (comments) 24/7 caregiver for wheelchair bound wife   Team Discharge Planning: Destination: PT-Home ,OT- Home , SLP-Home Projected Follow-up: PT-Home health PT, OT-  Home health OT, SLP-None Projected Equipment Needs: PT-To be determined, OT- 3 in 1 bedside comode,To be determined,Tub/shower seat, SLP-None recommended by SLP Equipment Details: PT- , OT-  Patient/family involved in discharge planning: PT- Patient,  OT-Patient, SLP-Patient  MD ELOS: 5-7 days. Medical Rehab Prognosis:  Good Assessment: Right-handed male history of diastolic congestive heart failure, dilated cardiomyopathy ejection fraction 45% followed at [facility] maintained on amiodarone, hyponatremia, hypertension, SDH after fall 11/30/2021 with conservative care provided.   He presented on 01/30/2021 with persistent headaches as well as decreased functional mobility.  X-rays imaging revealed right panhemispheric chronic SDH.  Underwent right bur hole for subdural hematoma  On 02/07/2021 per  Dr. Christella Noa.  Maintained on Keppra for seizure prophylaxis.  Hospital course episode presyncope associated PVCs identified on telemetry.  Noted blood pressure 60/40 received IV fluid with bolus.  Echocardiogram with ejection fraction of 50-55%, moderately depressed RV systolic function with cardiology service follow-up.  Initially maintained on amiodarone.  CTA of the chest obtained on 02/10/2021 revealed acute bilateral PEs,  a saddle embolus at right pulmonary hilum extending into right upper lobe and right middle lobe as well as multiple small emboli within the  right lower lobe, left lower lobe and left upper lobe pulmonary arteries.  Lower extremity Dopplers negative.  Patient was cleared to begin Eliquis for pulmonary emboli after initially being maintained on intravenous heparin.  Patient does have a history of persistent hyponatremia.  Tolerating a regular diet.  Patient with resulting functional mobility, self-care, endurance.  We will set goals for supervision/mod I with PT/OT.  Due to the current state of emergency, patients may not be receiving their 3-hours of Medicare-mandated therapy.  See Team Conference Notes for weekly updates to the plan of care

## 2021-02-19 NOTE — Progress Notes (Signed)
PROGRESS NOTE   Subjective/Complaints: Patient seen sitting up in bed this morning.  He states he slept well overnight.  He wants to confirm plan for discharge on Tuesday.  ROS: Denies CP, SOB, N/V/D  Objective:   No results found. Recent Labs    02/17/21 0024  WBC 6.5  HGB 11.3*  HCT 31.0*  PLT 265   Recent Labs    02/18/21 0039 02/19/21 0212  NA 129* 130*  K 3.4* 4.0  CL 96* 98  CO2 24 24  GLUCOSE 153* 112*  BUN 8 7*  CREATININE 0.68 0.71  CALCIUM 8.7* 8.7*    Intake/Output Summary (Last 24 hours) at 02/19/2021 0830 Last data filed at 02/19/2021 8916 Gross per 24 hour  Intake 630 ml  Output 2575 ml  Net -1945 ml        Physical Exam: Vital Signs Blood pressure (!) 144/90, pulse 69, temperature 98.1 F (36.7 C), temperature source Oral, resp. rate 14, height 5\' 10"  (1.778 m), SpO2 99 %. Constitutional: No distress . Vital signs reviewed. HENT: Normocephalic.  Scalp incision Eyes: EOMI. No discharge. Cardiovascular: No JVD.  RRR. Respiratory: Normal effort.  No stridor.  Bilateral clear to auscultation. GI: Non-distended.  BS +. Skin: Warm and dry.   Right scalp incision with staples CDI Psych: Normal mood.  Normal behavior. Musc: No edema in extremities.  No tenderness in extremities. Neuro: Alert Makes eye contact with examiner.   Follows simple commands.   Provides name age and place.   Motor: 4+/5 throughout , unchanged  Assessment/Plan: 1. Functional deficits which require 3+ hours per day of interdisciplinary therapy in a comprehensive inpatient rehab setting.  Physiatrist is providing close team supervision and 24 hour management of active medical problems listed below.  Physiatrist and rehab team continue to assess barriers to discharge/monitor patient progress toward functional and medical goals  Care Tool:  Bathing    Body parts bathed by patient: Right arm,Left  arm,Chest,Abdomen,Front perineal area,Buttocks,Right upper leg,Left upper leg,Right lower leg,Left lower leg,Face         Bathing assist Assist Level: Minimal Assistance - Patient > 75% (Min A for standing balance during standing for LB bathing)     Upper Body Dressing/Undressing Upper body dressing   What is the patient wearing?: Pull over shirt    Upper body assist Assist Level: Set up assist    Lower Body Dressing/Undressing Lower body dressing      What is the patient wearing?: Pants,Incontinence brief     Lower body assist Assist for lower body dressing: Moderate Assistance - Patient 50 - 74%     Toileting Toileting    Toileting assist Assist for toileting: Total Assistance - Patient < 25%     Transfers Chair/bed transfer  Transfers assist     Chair/bed transfer assist level: Maximal Assistance - Patient 25 - 49%     Locomotion Ambulation   Ambulation assist   Ambulation activity did not occur: Safety/medical concerns  Assist level: Minimal Assistance - Patient > 75% Assistive device: Walker-rolling Max distance: 100   Walk 10 feet activity   Assist     Assist level: Contact Guard/Touching assist Assistive device:  Walker-rolling   Walk 50 feet activity   Assist Walk 50 feet with 2 turns activity did not occur: Safety/medical concerns (fatigue)  Assist level: Minimal Assistance - Patient > 75% Assistive device: Walker-rolling    Walk 150 feet activity   Assist Walk 150 feet activity did not occur: Safety/medical concerns (fatigue)         Walk 10 feet on uneven surface  activity   Assist     Assist level: Moderate Assistance - Patient - 50 - 74% Assistive device: Hand held assist   Wheelchair     Assist Will patient use wheelchair at discharge?: No             Wheelchair 50 feet with 2 turns activity    Assist            Wheelchair 150 feet activity     Assist          Blood pressure (!)  144/90, pulse 69, temperature 98.1 F (36.7 C), temperature source Oral, resp. rate 14, height 5\' 10"  (1.778 m), SpO2 99 %.    Medical Problem List and Plan: 1.  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  Status post bur hole 02/07/2021  Continue CIR 2.  Antithrombotics: Bilateral pulmonary emboli identified on CT angiogram of the chest 02/10/2021 -DVT/anticoagulation: Continue Eliquis             -antiplatelet therapy: N/A 3. Pain Management:   Weaned Norco to q8H prn.    Controlled on 3/12             Monitor with increased exertion, particularly for vascular headaches 4. Mood: Xanax 1 mg nightly as needed             -antipsychotic agents: N/A 5. Neuropsych: This patient is capable of making decisions on his own behalf. 6. Skin/Wound Care: Routine skin checks  Plan to d/c staples on Monday 7. Fluids/Electrolytes/Nutrition: Routine in and outs 8.  Hypertension with frequent PVCs.  Patient cleared to begin low-dose Norvasc 2.5 mg daily as well as home amiodarone 200 mg daily and Toprol 25 mg twice daily  Borderline elevated on 3/12             Monitor with increased exertion 9.  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  Ejection fraction 45%.  Followed at [facility].  Follow-up cardiology services as needed 10.  Seizure prophylaxis.  Keppra 500 mg twice daily  11.  Hypothyroidism: Synthroid 12.  GERD.  Protonix 13.  Asthma.  Dulera 2 puffs twice daily  Monitor with increased exertion, controlled at present 14.  Persistent hyponatremia: Continue sodium chloride tablets             sodium 130 on 3/12, continue to monitor 15. Hypokalemia:  Potassium 4.0 on 3/12 after supplementation 15. Disposition: plan for d/c Tuesday- discussed with team. Patient cares for his wife who is in critical care right now.   LOS: 3 days A FACE TO FACE EVALUATION WAS PERFORMED  Durene Dodge Lorie Phenix 02/19/2021, 8:30 AM

## 2021-02-20 MED ORDER — HYDROCORTISONE 1 % EX CREA
TOPICAL_CREAM | Freq: Two times a day (BID) | CUTANEOUS | Status: DC
  Filled 2021-02-20: qty 28
  Filled 2021-02-20: qty 28.35

## 2021-02-20 MED ORDER — HYDROCORTISONE 1 % EX CREA
TOPICAL_CREAM | Freq: Two times a day (BID) | CUTANEOUS | Status: DC
  Filled 2021-02-20: qty 28

## 2021-02-20 NOTE — Plan of Care (Signed)
  Problem: Consults Goal: RH BRAIN INJURY PATIENT EDUCATION Description: Description: See Patient Education module for eduction specifics Outcome: Progressing   Problem: RH BOWEL ELIMINATION Goal: RH STG MANAGE BOWEL WITH ASSISTANCE Description: STG Manage Bowel with supervision Assistance. Outcome: Progressing   Problem: RH BLADDER ELIMINATION Goal: RH STG MANAGE BLADDER WITH ASSISTANCE Description: STG Manage Bladder With supervision Assistance Outcome: Progressing   Problem: RH SKIN INTEGRITY Goal: RH STG ABLE TO PERFORM INCISION/WOUND CARE W/ASSISTANCE Description: STG Able To Perform Incision/Wound Care With supervision Assistance. Outcome: Progressing   Problem: RH SAFETY Goal: RH STG ADHERE TO SAFETY PRECAUTIONS W/ASSISTANCE/DEVICE Description: STG Adhere to Safety Precautions With supervision Assistance/Device. Outcome: Progressing   Problem: RH COGNITION-NURSING Goal: RH STG ANTICIPATES NEEDS/CALLS FOR ASSIST W/ASSIST/CUES Description: STG Anticipates Needs/Calls for Assist With supervision Assistance/Cues. Outcome: Progressing   Problem: RH PAIN MANAGEMENT Goal: RH STG PAIN MANAGED AT OR BELOW PT'S PAIN GOAL Description: <3 on a 0-10 pain scale. Outcome: Progressing

## 2021-02-20 NOTE — Progress Notes (Signed)
Physical Therapy Session Note  Patient Details  Name: John Mack MRN: [id number] Date of Birth: Oct 11, 1941  Today's Date: 02/20/2021 PT Individual Time: 5056-9794 PT Individual Time Calculation (min): 57 min   Short Term Goals: Week 1:  PT Short Term Goal 1 (Week 1): STG=LTG due to LOS  Skilled Therapeutic Interventions/Progress Updates:    Pt supine in bed on arrival, agreeable to therapy. Supine<>sit with supervision and use of bed features. STS throughout session with min A-CGA at times. Gait x 150 ft with CGA and RW, with VCs for upright posture and to increase step height. Pt demonstrated shuffling gait pattern and flexed knees in stance that worsen with fatigue. Pt performed step taps on 3" step with RW, progressing to UUE support on RW, 2 x 30 sec SLS with foot on step with head turns progressing to UUE support on RW. Pt performed step overs with hockey stick, 2 x 20 with BUE support on RW. Pt required CGA to min A at times during standing balance tasks. Gait training x 300 ft with several seated rest breaks, CGA with RW. Gait training focused on improving gait mechanics and improving ambulation endurance. Pt returned to bed after session and was left with all needs in reach.  Therapy Documentation Precautions:  Precautions Precautions: Fall Restrictions Weight Bearing Restrictions: No    Therapy/Group: Individual Therapy  Sharen Counter, SPT 02/20/2021, 5:13 PM

## 2021-02-20 NOTE — Progress Notes (Signed)
Occupational Therapy Session Note  Patient Details  Name: John Mack MRN: [id number] Date of Birth: 08-Aug-1941  Today's Date: 02/20/2021 OT Individual Time: 0900-1000   &   1430-1500 OT Individual Time Calculation (min): 60 min    &   30 min   Short Term Goals: Week 1:  OT Short Term Goal 1 (Week 1): STGs = LTGs d/t ELOS  Skilled Therapeutic Interventions/Progress Updates:    AM session:   Patient in bed, alert and ready for therapy session.  He denies pain and states that he is eager to work on his balance.  Supine to sitting edge of bed with CS.  Completed oral care and dressing activities seated edge of bed.  Oral care requires set up, OH shirt set up, pants CG/min a.  Sit to stand and SPT with RW with CGA.  Completed standing balance, reaching and trunk mobility activities with CS/CGA - no LOB.  occ cues required with 3 step task to recall sequence.  He returned to bed at close of session, CGA for SPT with RW and CS for sit to supine, bed alarm set and call bell in hand.     PM session:   Patient on commode, states that stomach is mildly upset but having BM is helping.  Continent of moderate BM - requires max A for hygiene but able to pull pants over hips with CGA.  SPT with RW to bed with CGA.  He returned to commode due to ongoing urge to have BM, continent of 2nd moderate BM, CGA for clothing management, max A for hygiene.  Returned to edge of bed via walker with CGA.  Completed light UB exercises without difficulty - returned to supine in bed to rest before upcoming PT session, bed alarm set and callbell in reach.       Therapy Documentation Precautions:  Precautions Precautions: Fall Restrictions Weight Bearing Restrictions: No  Therapy/Group: Individual Therapy  Carlos Levering 02/20/2021, 7:39 AM

## 2021-02-21 MED ORDER — AMIODARONE HCL 200 MG PO TABS: 200.0000 mg | ORAL_TABLET | Freq: Every day | ORAL | 0 refills | Status: AC

## 2021-02-21 MED ORDER — DOCUSATE SODIUM 100 MG PO CAPS: 100.0000 mg | ORAL_CAPSULE | Freq: Two times a day (BID) | ORAL | 0 refills | Status: DC

## 2021-02-21 MED ORDER — LEVETIRACETAM 500 MG PO TABS: 500.0000 mg | ORAL_TABLET | Freq: Two times a day (BID) | ORAL | 0 refills | Status: DC

## 2021-02-21 MED ORDER — APIXABAN 5 MG PO TABS
5.0000 mg | ORAL_TABLET | Freq: Two times a day (BID) | ORAL | 0 refills | Status: AC
Start: 2021-02-21 — End: ?

## 2021-02-21 MED ORDER — AMLODIPINE BESYLATE 2.5 MG PO TABS: 2.5000 mg | ORAL_TABLET | Freq: Every day | ORAL | 0 refills | Status: AC

## 2021-02-21 MED ORDER — ACETAMINOPHEN 325 MG PO TABS: 650.0000 mg | ORAL_TABLET | ORAL | Status: AC | PRN

## 2021-02-21 MED ORDER — HYDROCODONE-ACETAMINOPHEN 5-325 MG PO TABS: 1.0000 | ORAL_TABLET | Freq: Three times a day (TID) | ORAL | 0 refills | Status: DC | PRN

## 2021-02-21 MED ORDER — ESOMEPRAZOLE MAGNESIUM 40 MG PO CPDR: 40.0000 mg | DELAYED_RELEASE_CAPSULE | Freq: Two times a day (BID) | ORAL | 0 refills | Status: AC

## 2021-02-21 MED ORDER — ALPRAZOLAM 0.5 MG PO TABS: 1.0000 mg | ORAL_TABLET | Freq: Every evening | ORAL | Status: DC | PRN

## 2021-02-21 MED ORDER — LEVETIRACETAM 500 MG PO TABS
500.0000 mg | ORAL_TABLET | Freq: Every day | ORAL | Status: DC
  Administered 2021-02-22: 500 mg via ORAL
  Filled 2021-02-21: qty 1

## 2021-02-21 MED ORDER — ALBUTEROL SULFATE (SENSOR) 108 (90 BASE) MCG/ACT IN AEPB: 1.0000 | INHALATION_SPRAY | Freq: Four times a day (QID) | RESPIRATORY_TRACT | 0 refills | Status: AC | PRN

## 2021-02-21 MED ORDER — SODIUM CHLORIDE 1 G PO TABS: 2.0000 g | ORAL_TABLET | Freq: Two times a day (BID) | ORAL | 0 refills | Status: DC

## 2021-02-21 MED ORDER — METOPROLOL SUCCINATE ER 25 MG PO TB24: 25.0000 mg | ORAL_TABLET | Freq: Two times a day (BID) | ORAL | 0 refills | Status: DC

## 2021-02-21 NOTE — Progress Notes (Signed)
PROGRESS NOTE   Subjective/Complaints: Patient's chart reviewed- No issues reported overnight Vitals signs stable Feels ready to go home tomorrow Discussed that staples would be removed today  ROS: Denies CP, SOB, N/V/D, pain  Objective:   No results found. No results for input(s): WBC, HGB, HCT, PLT in the last 72 hours. Recent Labs    02/19/21 0212  NA 130*  K 4.0  CL 98  CO2 24  GLUCOSE 112*  BUN 7*  CREATININE 0.71  CALCIUM 8.7*    Intake/Output Summary (Last 24 hours) at 02/21/2021 0831 Last data filed at 02/21/2021 0717 Gross per 24 hour  Intake 720 ml  Output 3400 ml  Net -2680 ml        Physical Exam: Vital Signs Blood pressure 131/83, pulse 65, temperature 99.4 F (37.4 C), temperature source Oral, resp. rate 16, height 5\' 10"  (1.778 m), SpO2 94 %. Gen: no distress, normal appearing HEENT: oral mucosa pink and moist, NCAT Cardio: Reg rate Chest: normal effort, normal rate of breathing Abd: soft, non-distended Ext: no edema Psych: pleasant, normal affect Skin:  Right scalp incision with staples CDI Psych: Normal mood.  Normal behavior. Musc: No edema in extremities.  No tenderness in extremities. Neuro: Alert Makes eye contact with examiner.   Follows simple commands.   Provides name age and place.   Motor: 4+/5 throughout , unchanged  Assessment/Plan: 1. Functional deficits which require 3+ hours per day of interdisciplinary therapy in a comprehensive inpatient rehab setting.  Physiatrist is providing close team supervision and 24 hour management of active medical problems listed below.  Physiatrist and rehab team continue to assess barriers to discharge/monitor patient progress toward functional and medical goals  Care Tool:  Bathing    Body parts bathed by patient: Right arm,Left arm,Chest,Abdomen,Front perineal area,Buttocks,Right upper leg,Left upper leg,Right lower leg,Left lower  leg,Face         Bathing assist Assist Level: Minimal Assistance - Patient > 75% (Min A for standing balance during standing for LB bathing)     Upper Body Dressing/Undressing Upper body dressing   What is the patient wearing?: Pull over shirt    Upper body assist Assist Level: Set up assist    Lower Body Dressing/Undressing Lower body dressing      What is the patient wearing?: Underwear/pull up     Lower body assist Assist for lower body dressing: Moderate Assistance - Patient 50 - 74%     Toileting Toileting    Toileting assist Assist for toileting: Moderate Assistance - Patient 50 - 74%     Transfers Chair/bed transfer  Transfers assist     Chair/bed transfer assist level: Minimal Assistance - Patient > 75%     Locomotion Ambulation   Ambulation assist   Ambulation activity did not occur: Safety/medical concerns  Assist level: Contact Guard/Touching assist Assistive device: Walker-rolling Max distance: 150   Walk 10 feet activity   Assist     Assist level: Contact Guard/Touching assist Assistive device: Walker-rolling   Walk 50 feet activity   Assist Walk 50 feet with 2 turns activity did not occur: Safety/medical concerns (fatigue)  Assist level: Contact Guard/Touching assist Assistive device: Walker-rolling  Walk 150 feet activity   Assist Walk 150 feet activity did not occur: Safety/medical concerns (fatigue)  Assist level: Contact Guard/Touching assist Assistive device: Walker-rolling    Walk 10 feet on uneven surface  activity   Assist     Assist level: Moderate Assistance - Patient - 50 - 74% Assistive device: Hand held assist   Wheelchair     Assist Will patient use wheelchair at discharge?: No             Wheelchair 50 feet with 2 turns activity    Assist            Wheelchair 150 feet activity     Assist          Blood pressure 131/83, pulse 65, temperature 99.4 F (37.4 C),  temperature source Oral, resp. rate 16, height 5\' 10"  (1.778 m), SpO2 94 %.    Medical Problem List and Plan: 1.  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  Status post bur hole 02/07/2021  Continue CIR 2.  Antithrombotics: Bilateral pulmonary emboli identified on CT angiogram of the chest 02/10/2021 -DVT/anticoagulation: Continue Eliquis             -antiplatelet therapy: N/A 3. Pain Management:   Weaned Norco to q8H prn.    Controlled on 3/12             Monitor with increased exertion, particularly for vascular headaches 4. Mood: Xanax 1 mg nightly as needed             -antipsychotic agents: N/A 5. Neuropsych: This patient is capable of making decisions on his own behalf. 6. Skin/Wound Care: Routine skin checks  D/c staples today 7. Fluids/Electrolytes/Nutrition: Routine in and outs 8.  Hypertension with frequent PVCs.  Patient cleared to begin low-dose Norvasc 2.5 mg daily as well as home amiodarone 200 mg daily and Toprol 25 mg twice daily  Borderline elevated on 3/12             Monitor with increased exertion 9.  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  Ejection fraction 45%.  Followed at [facility].  Follow-up cardiology services as needed 10.  Seizure prophylaxis.  Wean Keppra to 500mg  daily; will d/c prior to discharge 11.  Hypothyroidism: Synthroid 12.  GERD.  Protonix 13.  Asthma.  Dulera 2 puffs twice daily  Monitor with increased exertion, controlled at present 14.  Persistent hyponatremia: Continue sodium chloride tablets             sodium 130 on 3/14, repeat tomorrow.  15. Hypokalemia:  Potassium 4.0 on 3/12 after supplementation 15. Disposition: plan for d/c Tuesday- discussed with team. Patient cares for his wife who is in critical care right now. Discussed importance of continuing nursing care for wife so he will have to provide her less assistance and  can focus on his own recovery.    LOS: 5 days A FACE TO FACE EVALUATION WAS PERFORMED  John Mack John Mack 02/21/2021, 8:31 AM

## 2021-02-21 NOTE — Progress Notes (Addendum)
Physical Therapy Session Note  Patient Details  Name: SERGEI DELO MRN: [id number] Date of Birth: [birth year]  Today's Date: 02/21/2021 PT Individual Time: 1346-1441 PT Individual Time Calculation (min): 55 min   Short Term Goals: Week 1:  PT Short Term Goal 1 (Week 1): STG=LTG due to LOS  Skilled Therapeutic Interventions/Progress Updates:   Received pt supine in bed, pt agreeable to therapy, and denied any pain during session. Session with emphasis on functional mobility/transfers, generalized strengthening, dynamic standing balance/coordination, and improved activity tolerance. Pt performed bed mobility mod I using bedrails x 2 trials throughout session. Pt reported urge to urinate and able to manage clothing and void in urinal mod I. Stand<>pivot bed<>WC with RW and supervision. Pt performed WC mobility 167ft using BUE and supervision to [facility] elevator and transported down to 70M ortho gym in [facility] total A remainder of way for time management purposes. Pt performed BUE/LE strengthening on Nustep at workload 4 for 10 minutes for a total of 316 steps for improved cardiovascular endurance. Pt demonstrated slow pace and decreased ROM. Pt transported back to room in [facility] total A and requested to return to bed. Stand<>piovt WC<>bed with RW and supervision. Concluded session with pt supine in bed, needs within reach, and bed alarm on. Therapist provided multiple blankets and moist heat packs to pt as he reported feeling cold. Pt verbalized confidence with all tasks to ensure safe D/C home and with no further questions regarding mobility.   Therapy Documentation Precautions:  Precautions Precautions: Fall Restrictions Weight Bearing Restrictions: No  Therapy/Group: Individual Therapy Alfonse Alpers PT, DPT   02/21/2021, 7:36 AM

## 2021-02-21 NOTE — Progress Notes (Signed)
Physical Therapy Discharge Summary  Patient Details  Name: John Mack MRN: [id number] Date of Birth: [birth year]  Patient has met 1 of 9 long term goals due to improved activity tolerance, improved balance, improved postural control and improved coordination. Patient to discharge at an ambulatory level Supervision. Patient's care partner unavailable to provide the necessary physical assistance at discharge. Pt plans to have assist available at home and verbalizes understanding of impairments and limitations and demonstrates good safety awareness.   Reasons goals not met: Pt did not meet mod I transfer and gait goals as pt is currently at a supervision level overall. Pt did not meet bed mobility goal of independent as pt currently relies on use of bedrails for assistance. Pt ultimately declined staying in CIR any longer to and requested to discharge ASAP.   Recommendation:  Patient will benefit from ongoing skilled PT services in home health setting to continue to advance safe functional mobility, address ongoing impairments in transfers, generalized strengthening, dynamic standing balance/coordination, ambulation, endurance, and to minimize fall risk.  Equipment: RW  Reasons for discharge: discharge from hospital and pt requesting to move up D/C date  Patient/family agrees with progress made and goals achieved: Yes  PT Discharge Precautions/Restrictions Precautions Precautions: Fall Restrictions Weight Bearing Restrictions: No Cognition Overall Cognitive Status: Within Functional Limits for tasks assessed Arousal/Alertness: Awake/alert Orientation Level: Oriented X4 Memory: Appears intact Awareness: Appears intact Problem Solving: Appears intact Safety/Judgment: Appears intact Sensation Sensation Light Touch: Appears Intact Proprioception: Appears Intact Coordination Gross Motor Movements are Fluid and Coordinated: No Fine Motor Movements are Fluid and Coordinated:  No Coordination and Movement Description: mild uncoordination due to decreased balance/postural control, generalized weakness, and decreased endurance Finger Nose Finger Test: mild tremors in LUE Heel Shin Test: [facility] bilaterally Motor  Motor Motor: Within Functional Limits Motor - Skilled Clinical Observations: generalized weakness and poor endurance to activity  Mobility Bed Mobility Bed Mobility: Rolling Right;Rolling Left;Sit to Supine;Supine to Sit Rolling Right: Independent Rolling Left: Independent Supine to Sit: Independent with assistive device Sit to Supine: Independent with assistive device Transfers Transfers: Sit to Stand;Stand to Sit;Stand Pivot Transfers Sit to Stand: Supervision/Verbal cueing Stand to Sit: Supervision/Verbal cueing Stand Pivot Transfers: Supervision/Verbal cueing Transfer (Assistive device): Rolling walker Locomotion  Gait Ambulation: Yes Gait Assistance: Supervision/Verbal cueing Gait Distance (Feet): 150 Feet Assistive device: Rolling walker Gait Gait: Yes Gait Pattern: Impaired Gait Pattern: Decreased step length - left;Decreased step length - right;Trunk flexed;Poor foot clearance - right;Poor foot clearance - left;Decreased trunk rotation Gait velocity: decreased Stairs / Additional Locomotion Stairs: Yes Stairs Assistance: Contact Guard/Touching assist Stair Management Technique: Two rails Number of Stairs: 4 Height of Stairs: 6 Ramp: Minimal Assistance - Patient >75% (handheld assist) Wheelchair Mobility Wheelchair Mobility: Yes Wheelchair Assistance: Doctor, general practice: Both upper extremities Wheelchair Parts Management: Needs assistance Distance: 164ft  Trunk/Postural Assessment  Cervical Assessment Cervical Assessment: Within Functional Limits Thoracic Assessment Thoracic Assessment: Exceptions to [facility] (mild kyphosis) Lumbar Assessment Lumbar Assessment: Exceptions to [facility] (posterior pelvic  tilt) Postural Control Postural Control: Within Functional Limits  Balance Balance Balance Assessed: Yes Static Sitting Balance Static Sitting - Balance Support: Feet supported;Bilateral upper extremity supported Static Sitting - Level of Assistance: 7: Independent Dynamic Sitting Balance Dynamic Sitting - Balance Support: Feet supported;No upper extremity supported Dynamic Sitting - Level of Assistance: 7: Independent Static Standing Balance Static Standing - Balance Support: Bilateral upper extremity supported (RW) Static Standing - Level of Assistance: 5: Stand by assistance (supervision) Dynamic Standing Balance  Dynamic Standing - Balance Support: Bilateral upper extremity supported (supervision) Dynamic Standing - Level of Assistance: 5: Stand by assistance (supervision) Extremity Assessment  RLE Assessment RLE Assessment: Within Functional Limits LLE Assessment LLE Assessment: Within Functional Limits  Alfonse Alpers PT, DPT  02/21/2021, 7:34 AM

## 2021-02-21 NOTE — Progress Notes (Addendum)
Patient ID: John Mack, male   DOB: 11-Sep-1941, 80 y.o.   MRN: [id number] Pt still insistent regarding discharging home tomorrow. Have ordered equipment-rw, 3 in 1 and tub seat. Will discuss preference for Home health. Work toward discharge tomorrow.  10:30 AM Pt has no preference regarding home health agency. Referral made to [facility] for PT & OT follow up. Aware will contact him at home to arrange follow up.

## 2021-02-21 NOTE — Progress Notes (Signed)
Physical Therapy Session Note  Patient Details  Name: John Mack MRN: [id number] Date of Birth: 30-Nov-1941  Today's Date: 02/21/2021 PT Individual Time: 1102-1200 PT Individual Time Calculation (min): 58 min   Short Term Goals: Week 1:  PT Short Term Goal 1 (Week 1): STG=LTG due to LOS  Skilled Therapeutic Interventions/Progress Updates:    pt received in bed and agreeable to therapy. Pt denied pain during session. Pt directed in supine>sit mod I, Sit to stand supervision with Rolling walker and transferred to [facility] at supervision. Pt then taken total A to gym for time and energy. Pt directed in gait training with Rolling walker 200' supervision with very short step length Bilaterally however no LOB and improved with one cue to correct. Pt directed in car transfer with estimated height of car for DC supervision. Pt then directed in ascending/descending ramp with Rolling walker supervision good safety awareness noted.  Pt directed in [place] mobility 150' supervision and educated on [facility] mechanics, break use and leg rest use. Pt directed in ascending/descending x4 6" steps with B handrails at [place]. Pt educated on importance of safety at home and reported he will have assistance to enter home, have someone with him for "almost all of the day" and reports he understands his fall risk, and knows he needs to use the walker and have assistance for home exit and entry, someone is doing his laundry and has arranged for grocery and medication delivery. Pt directed in x2 furniture transfers supervision with walker use. Pt returned to room total A for time at end of session and directed in transfer to bed supervision and mod I sit>supine. Pt left in bed, All needs in reach and in good condition. Call light in hand.  And alarm set.   Therapy Documentation Precautions:  Precautions Precautions: Fall Restrictions Weight Bearing Restrictions: No General:   Vital Signs:   Pain: Pain Assessment Pain Scale:  0-10 Pain Score: 2  Pain Type: Acute pain Pain Location: Head Pain Orientation: Lower Pain Radiating Towards: Neck Pain Descriptors / Indicators: Aching;Discomfort Pain Frequency: Intermittent Pain Onset: Gradual Patients Stated Pain Goal: 2 Pain Intervention(s): Medication (See eMAR);Repositioned;Rest Mobility: Transfers Transfers: Sit to Stand;Stand Pivot Transfers Sit to Stand: Supervision/Verbal cueing Stand to Sit: Supervision/Verbal cueing Stand Pivot Transfers: Independent with assistive device Transfer (Assistive device): Rolling walker Locomotion :    Trunk/Postural Assessment :    Balance:   Exercises:   Other Treatments:      Therapy/Group: Individual Therapy  Junie Panning 02/21/2021, 12:12 PM

## 2021-02-21 NOTE — Discharge Instructions (Signed)
Inpatient Rehab Discharge Instructions  John Mack Discharge date and time: No discharge date for patient encounter.   Activities/Precautions/ Functional Status: Activity: activity as tolerated Diet: regular diet Wound Care: Routine skin checks Functional status:  ___ No restrictions     ___ Walk up steps independently ___ 24/7 supervision/assistance   ___ Walk up steps with assistance ___ Intermittent supervision/assistance  ___ Bathe/dress independently ___ Walk with walker     _x__ Bathe/dress with assistance ___ Walk Independently    ___ Shower independently ___ Walk with assistance    ___ Shower with assistance ___ No alcohol     ___ Return to work/school ________  Special Instructions: No driving smoking or alcohol   COMMUNITY REFERRALS UPON DISCHARGE:    Home Health:   PT & OT                   Agency:[facility]  [place] Equipment/Items [place], 3 IN 1 AND TUB SEAT                                                 Agency/Supplier:[facility]  [phone]   [facility] cardiology team  Patient should have follow-up chemistries in 1 week to monitor sodium levels   My questions have been answered and I understand these instructions. I will adhere to these goals and the provided educational materials after my discharge from the hospital.  Patient/Caregiver Signature _______________________________ Date __________  Clinician Signature _______________________________________ Date __________  Please bring this form and your medication list with you to all your follow-up doctor's appointments.     ----------------------------------------------------------------------------- Information on my medicine - ELIQUIS (apixaban)  This medication education was reviewed with me or my healthcare representative as part of my discharge preparation.    Why was Eliquis prescribed for you? Eliquis was prescribed  to treat blood clots  in your lungs (pulmonary embolism) and to reduce the risk of them occurring again.  What do You need to know about Eliquis ? The dose is ONE 5 mg tablet taken TWICE daily.  Eliquis may be taken with or without food.   Try to take the dose about the same time in the morning and in the evening. If you have difficulty swallowing the tablet whole please discuss with your pharmacist how to take the medication safely.  Take Eliquis exactly as prescribed and DO NOT stop taking Eliquis without talking to the doctor who prescribed the medication.  Stopping may increase your risk of developing a new blood clot.  Refill your prescription before you run out.  After discharge, you should have regular check-up appointments with your healthcare provider that is prescribing your Eliquis.    What do you do if you miss a dose? If a dose of ELIQUIS is not taken at the scheduled time, take it as soon as possible on the same day and twice-daily administration should be resumed. The dose should not be doubled to make up for a missed dose.  Important Safety Information A possible side effect of Eliquis is bleeding. You should call your healthcare provider right away if you experience any of the following: ? Bleeding from an injury or your nose that does not stop. ? Unusual colored urine (red or dark brown) or unusual colored stools (red or black). ? Unusual bruising  for unknown reasons. ? A serious fall or if you hit your head (even if there is no bleeding).  Some medicines may interact with Eliquis and might increase your risk of bleeding or clotting while on Eliquis. To help avoid this, consult your healthcare provider or pharmacist prior to using any new prescription or non-prescription medications, including herbals, vitamins, non-steroidal anti-inflammatory drugs (NSAIDs) and supplements.  This website has more information on Eliquis (apixaban):  http://[email or web address].com/eliquis/home

## 2021-02-21 NOTE — Progress Notes (Signed)
Occupational Therapy Session Note  Patient Details  Name: John Mack MRN: [id number] Date of Birth: 1941-04-08  Today's Date: 02/21/2021 OT Individual Time: 1856-3149 OT Individual Time Calculation (min): 68 min    Short Term Goals: Week 1:  OT Short Term Goal 1 (Week 1): STGs = LTGs d/t ELOS  Skilled Therapeutic Interventions/Progress Updates:    Treatment session with focus on self-care retraining and d/c planning.  Pt received semi-reclined in bed agreeable to therapy session.  Pt reports plan to d/c home tomorrow.  Agreeable to focusing on all self-care retraining tasks in preparation for d/c home.  Pt reports does not plan to shower initially as he feels too unsteady to access shower at this time. Pt did complete shower transfer, stepping over shower ledge with CGA with RW and use of grab bars.  Therapist recommending shower chair for safety when pt ready for shower, with pt in agreement.  Discussed 3 in1 for home, with pt agreeable as well.  Pt completed bathing and dressing from EOB with setup for items and pt able to complete all with distant supervision at sit > stand level.  Pt ambulated to/from toilet with RW and distant supervision to Mod I with no cues from therapist.  Pt demonstrating improved awareness of thresholds and navigating narrow spaces with RW. Discussed home support system with pt reporting RN will provide all care for his wife and will be around to provide supervision as needed for him.  Pt reports having sister in law across the road who can assist with meals.  Pt reports having other friends who can assist as needed as well.  Pt pleased with progress and successes from today's session and ready for d/c home tomorrow.  Therapy Documentation Precautions:  Precautions Precautions: Fall Restrictions Weight Bearing Restrictions: No Pain: Pain Assessment Pain Scale: 0-10 Pain Score: 2  Pain Type: Acute pain Pain Location: Head Pain Orientation: Lower Pain  Radiating Towards: Neck Pain Descriptors / Indicators: Aching;Discomfort Pain Frequency: Intermittent Pain Onset: Gradual Patients Stated Pain Goal: 2 Pain Intervention(s): Medication (See eMAR);Repositioned;Rest ADL: ADL Eating: Independent Where Assessed-Eating: Edge of bed Grooming: Setup Where Assessed-Grooming: Standing at sink Upper Body Bathing: Setup Where Assessed-Upper Body Bathing: Edge of bed Lower Body Bathing: Setup Where Assessed-Lower Body Bathing: Edge of bed Upper Body Dressing: Setup Where Assessed-Upper Body Dressing: Edge of bed Lower Body Dressing: Supervision/safety Where Assessed-Lower Body Dressing: Edge of bed Toileting: Minimal assistance Toilet Transfer: Modified independent Armed forces technical officer Method: Counselling psychologist: Geophysical data processor: Curator Method: Heritage manager: Grab bars   Therapy/Group: Individual Therapy  Simonne Come 02/21/2021, 12:23 PM

## 2021-02-21 NOTE — Progress Notes (Signed)
Inpatient Rehabilitation Care Coordinator Discharge Note  The overall goal for the admission was met for:   Discharge location: [place] HIRED ASSIST-RETIRED RN AND NEIGHBOR  Length of Stay: Yes- 6 DAYS  Discharge activity level: Yes-MOD/I LEVEL  Home/community participation: Yes  Services provided included: MD, RD, PT, OT, SLP, RN, CM, Pharmacy, Neuropsych and SW  Financial Services: Medicare and Private Insurance: [employer] [place]  Choices offered to/list presented to:YES  Follow-up services arranged: Home Health: [facility]-PT & OT, DME: [facility], 3 IN 1 AND TUB SEAT, Other: [employer] and Patient/Family has no preference for HH/DME agencies  Comments (or additional information):HAS HIRED RETIRED RN PROVIDING CARE FOR HIS WIFE WHILE HE IS HERE AND WILL BE THERE UNTIL HE IS ABLE TO RESUME [place]. WIFE IS WHEELCHAIR BOUND  Patient/Family verbalized understanding of follow-up arrangements: Yes  Individual responsible for coordination of the follow-up plan: SELF [phone]  Confirmed correct DME delivered: [name] 02/21/2021    Reilly Blades, Gardiner Rhyme

## 2021-02-21 NOTE — Discharge Summary (Signed)
Physician Discharge Summary  Patient ID: John Mack MRN: [id number] DOB/AGE: Oct 08, 1941 79 y.o.  Admit date: 02/16/2021 Discharge date: 02/22/2021  Discharge Diagnoses:  Principal Problem:   Subdural hematoma ([place]) Active Problems:   Hypokalemia   Essential hypertension   Vascular headache Anxiety Diastolic congestive heart failure Seizure prophylaxis Hypothyroidism GERD Asthma Persistent hyponatremia  Discharged Condition: Stable  Significant Diagnostic Studies: CT HEAD WO CONTRAST  Result Date: 02/09/2021 CLINICAL DATA:  Chronic subdural hemorrhage EXAM: CT HEAD WITHOUT CONTRAST TECHNIQUE: Contiguous axial images were obtained from the base of the skull through the vertex without intravenous contrast. COMPARISON:  11/29/2020 FINDINGS: Brain: Interval decompression of subdural hematoma on the right with a drain in place. No acute and discrete hemorrhage in the interim. Midline shift has diminished to 4 mm at the septum pellucidum. No entrapment or complicating infarct. Vascular: No hyperdense vessel or unexpected calcification. Skull: Unremarkable right-sided burr hole Sinuses/Orbits: Negative IMPRESSION: Decompressed subdural hemorrhage on the right without complicating features. Midline shift has diminished to 4 mm. Electronically Signed   By: Monte Fantasia M.D.   On: 02/09/2021 08:04   CT ANGIO CHEST PE W OR WO CONTRAST  Result Date: 02/10/2021 CLINICAL DATA:  Near syncopal episode today when getting up, hypoxic with ambulation, clinical suspicion of pulmonary embolism. History of dilated cardiomyopathy, asthma, CHF, GERD, hypertension EXAM: CT ANGIOGRAPHY CHEST WITH CONTRAST TECHNIQUE: Multidetector CT imaging of the chest was performed using the standard protocol during bolus administration of intravenous contrast. Multiplanar CT image reconstructions and MIPs were obtained to evaluate the vascular anatomy. CONTRAST:  21mL OMNIPAQUE IOHEXOL 350 MG/ML SOLN IV COMPARISON:  CT  chest 10/31/2006 FINDINGS: Cardiovascular: Atherosclerotic calcifications aorta without aneurysm or dissection. Cardiac chambers enlarged. No pericardial effusion. Pulmonary arteries adequately opacified. Saddle embolus identified at RIGHT pulmonary hilum extending into RIGHT upper lobe and RIGHT middle lobe. Additional small emboli seen within RIGHT lower lobe, LEFT lower lobe, and LEFT upper lobe pulmonary arteries. Elevated RV/LV ratio = 1.91. RIGHT ventricle appears more dilated than on the previous exam. However it is uncertain how much of the observed RIGHT ventricular dilatation is due to RIGHT heart strain and how much may be related to underlying history of dilated cardiomyopathy. Mediastinum/Nodes: Esophagus unremarkable. Base of cervical region normal appearance. No thoracic adenopathy. Lungs/Pleura: Minimal LEFT upper lobe infiltrate. Remaining lungs clear. Few small nodular foci at the inferior RIGHT lung appear stable since 2007. No enlarging pulmonary mass identified. No pleural effusion or pneumothorax. Upper Abdomen: Unremarkable Musculoskeletal: No acute abnormalities. Review of the MIP images confirms the above findings. IMPRESSION: Bilateral pulmonary emboli including a saddle embolus at RIGHT pulmonary hilum extending into RIGHT upper lobe and RIGHT middle lobe as well as multiple small emboli within RIGHT lower lobe, LEFT lower lobe and LEFT upper lobe pulmonary arteries. Positive for acute PE with an elevated RV/LV ratio of 1.91. This value is consistent with RIGHT heart strain and at least submassive PE, and is associated with an increased risk of morbidity and mortality. However it is uncertain as to what portion of the observed RIGHT ventricular dilatation may be related to underlying cardiac dysfunction/dilated cardiomyopathy as opposed to acute pulmonary embolism. Aortic Atherosclerosis (ICD10-I70.0). Critical Value/emergent results were called by telephone at the time of interpretation  on 02/10/2021 at 1717 hours to provider Dr. Marcello Moores, Who verbally acknowledged these results. Electronically Signed   By: Lavonia Dana M.D.   On: 02/10/2021 17:18   ECHOCARDIOGRAM COMPLETE  Result Date: 02/10/2021    ECHOCARDIOGRAM REPORT  Patient Name:   John Mack Date of Exam: 02/10/2021 Medical Rec #:  [id number]        Height:       70.0 in Accession #:    [id number]       Weight:       205.0 lb Date of Birth:  1941-01-25       BSA:          2.109 m Patient Age:    61 years         BP:           122/71 mmHg Patient Gender: M                HR:           60 bpm. Exam Location:  Inpatient Procedure: 2D Echo, Cardiac Doppler and Color Doppler Indications:    Abnormal EKG  History:        Patient has no prior history of Echocardiogram examinations.                 CHF, TIA, Arrythmias:Abnormal EKG and Atrial Fibrillation,                 Signs/Symptoms:Syncope; Risk Factors:Dyslipidemia and                 Hypertension. Subdural hematoma.  Sonographer:    Dustin Flock Referring Phys: [place]  1. There is severe RV enlargement with moderately depressed RV systolic function. The RV apex appears hyperkinetic compared to the base. Findings appear to be new compared to prior echo report at [facility] (images not available). This is highly concerning for an acute pulmonary embolism.  2. There is moderately elevated pulmonary artery systolic pressure. The estimated right ventricular systolic pressure is 29.9 mmHg.  3. Left ventricular ejection fraction, by estimation, is 50 to 55%. The left ventricle has low normal function. The left ventricle has no regional wall motion abnormalities.  4. There is mild concentric left ventricular hypertrophy. Left ventricular diastolic parameters are consistent with Grade I diastolic dysfunction (impaired relaxation).  5. There is the interventricular septum is flattened in systole, consistent with right ventricular pressure overload.  6. Right atrial size  was severely dilated.  7. The mitral valve is normal in structure. Trivial mitral valve regurgitation.  8. There is mild-to-moderate highly eccentric tricuspid regurgitation.  9. The aortic valve is tricuspid. There is mild calcification of the aortic valve. There is mild thickening of the aortic valve. Aortic valve regurgitation is mild. 10. Aortic dilatation noted. There is mild dilatation of the aortic root, measuring 41 mm. 11. The inferior vena cava is dilated in size with <50% respiratory variability, suggesting right atrial pressure of 15 mmHg. FINDINGS  Left Ventricle: Left ventricular ejection fraction, by estimation, is 50 to 55%. The left ventricle has low normal function. The left ventricle has no regional wall motion abnormalities. The left ventricular internal cavity size was normal in size. There is mild concentric left ventricular hypertrophy. The interventricular septum is flattened in systole, consistent with right ventricular pressure overload. Left ventricular diastolic parameters are consistent with Grade I diastolic dysfunction (impaired relaxation). Right Ventricle: The right ventricular size is severely enlarged. No increase in right ventricular wall thickness. Right ventricular systolic function is moderately reduced. There is moderately elevated pulmonary artery systolic pressure. The tricuspid regurgitant velocity is 3.12 m/s, and with an assumed right atrial pressure of 15 mmHg, the estimated right ventricular systolic pressure is  53.9 mmHg. Findings concerning for possible pulmonary embolism. Left Atrium: Left atrial size was normal in size. Right Atrium: Right atrial size was severely dilated. Pericardium: There is no evidence of pericardial effusion. Mitral Valve: The mitral valve is normal in structure. Trivial mitral valve regurgitation. Tricuspid Valve: The tricuspid valve is normal in structure. Tricuspid valve regurgitation mild-to-moderate highly eccentric. Aortic Valve: The  aortic valve is tricuspid. There is mild calcification of the aortic valve. There is mild thickening of the aortic valve. Aortic valve regurgitation is mild. Pulmonic Valve: The pulmonic valve was normal in structure. Pulmonic valve regurgitation is trivial. Aorta: Aortic dilatation noted. There is mild dilatation of the aortic root, measuring 41 mm. Venous: The inferior vena cava is dilated in size with less than 50% respiratory variability, suggesting right atrial pressure of 15 mmHg. IAS/Shunts: No atrial level shunt detected by color flow Doppler.  LEFT VENTRICLE PLAX 2D LVIDd:         4.60 cm  Diastology LVIDs:         2.90 cm  LV e' medial:    5.11 cm/s LV PW:         1.20 cm  LV E/e' medial:  7.0 LV IVS:        1.20 cm  LV e' lateral:   5.55 cm/s LVOT diam:     2.50 cm  LV E/e' lateral: 6.5 LV SV:         54 LV SV Index:   26 LVOT Area:     4.91 cm  RIGHT VENTRICLE RV Basal diam:  5.40 cm RV S prime:     8.59 cm/s TAPSE (M-mode): 2.8 cm LEFT ATRIUM             Index       RIGHT ATRIUM           Index LA diam:        3.40 cm 1.61 cm/m  RA Area:     24.90 cm LA Vol (A2C):   53.1 ml 25.17 ml/m RA Volume:   87.30 ml  41.39 ml/m LA Vol (A4C):   26.9 ml 12.75 ml/m LA Biplane Vol: 40.6 ml 19.25 ml/m  AORTIC VALVE LVOT Vmax:   64.00 cm/s LVOT Vmean:  47.400 cm/s LVOT VTI:    0.111 m  AORTA Ao Root diam: 3.60 cm MITRAL VALVE               TRICUSPID VALVE MV Area (PHT): 4.89 cm    TR Peak grad:   38.9 mmHg MV Decel Time: 155 msec    TR Vmax:        312.00 cm/s MV E velocity: 36.00 cm/s MV A velocity: 46.30 cm/s  SHUNTS MV E/A ratio:  0.78        Systemic VTI:  0.11 m                            Systemic Diam: 2.50 cm Gwyndolyn Kaufman MD Electronically signed by Gwyndolyn Kaufman MD Signature Date/Time: 02/10/2021/3:43:21 PM    Final    VAS [place] LOWER EXTREMITY VENOUS (DVT)  Result Date: 02/11/2021  Lower Venous DVT Study Indications: Pulmonary embolism.  Comparison Study: no prior Performing Technologist: Abram Sander RVS  Examination Guidelines: A complete evaluation includes B-mode imaging, spectral Doppler, color Doppler, and power Doppler as needed of all accessible portions of each vessel. Bilateral testing is considered an integral part of a complete  examination. Limited examinations for reoccurring indications may be performed as noted. The reflux portion of the exam is performed with the patient in reverse Trendelenburg.  +---------+---------------+---------+-----------+----------+--------------+  RIGHT     Compressibility Phasicity Spontaneity Properties Thrombus Aging  +---------+---------------+---------+-----------+----------+--------------+  CFV       Full            Yes       Yes                                    +---------+---------------+---------+-----------+----------+--------------+  SFJ       Full                                                             +---------+---------------+---------+-----------+----------+--------------+  FV Prox   Full                                                             +---------+---------------+---------+-----------+----------+--------------+  FV Mid    Full                                                             +---------+---------------+---------+-----------+----------+--------------+  FV Distal Full                                                             +---------+---------------+---------+-----------+----------+--------------+  PFV       Full                                                             +---------+---------------+---------+-----------+----------+--------------+  POP       Full            Yes       Yes                                    +---------+---------------+---------+-----------+----------+--------------+  PTV       Full                                                             +---------+---------------+---------+-----------+----------+--------------+  PERO      Full                                                              +---------+---------------+---------+-----------+----------+--------------+   +---------+---------------+---------+-----------+----------+--------------+  LEFT      Compressibility Phasicity Spontaneity Properties Thrombus Aging  +---------+---------------+---------+-----------+----------+--------------+  CFV       Full            Yes       Yes                                    +---------+---------------+---------+-----------+----------+--------------+  SFJ       Full                                                             +---------+---------------+---------+-----------+----------+--------------+  FV Prox   Full                                                             +---------+---------------+---------+-----------+----------+--------------+  FV Mid    Full                                                             +---------+---------------+---------+-----------+----------+--------------+  FV Distal Full                                                             +---------+---------------+---------+-----------+----------+--------------+  PFV       Full                                                             +---------+---------------+---------+-----------+----------+--------------+  POP       Full            Yes       Yes                                    +---------+---------------+---------+-----------+----------+--------------+  PTV       Full                                                             +---------+---------------+---------+-----------+----------+--------------+  PERO      Full                                                             +---------+---------------+---------+-----------+----------+--------------+  Summary: BILATERAL: - No evidence of deep vein thrombosis seen in the lower extremities, bilaterally. - No evidence of superficial venous thrombosis in the lower extremities, bilaterally. -No evidence of popliteal cyst, bilaterally.   *See table(s) above for measurements  and observations. Electronically signed by Jamelle Haring on 02/11/2021 at 4:53:00 PM.    Final     Labs:  Basic Metabolic Panel: Recent Labs  Lab 02/17/21 0024 02/18/21 0039 02/19/21 0212 02/22/21 0019  NA 128* 129* 130* 128*  K 3.4* 3.4* 4.0 3.0*  CL 94* 96* 98 95*  CO2 26 24 24 25   GLUCOSE 114* 153* 112* 132*  BUN 8 8 7* 6*  CREATININE 0.73 0.68 0.71 0.77  CALCIUM 8.5* 8.7* 8.7* 8.7*    CBC: Recent Labs  Lab 02/17/21 0024  WBC 6.5  NEUTROABS 4.5  HGB 11.3*  HCT 31.0*  MCV 88.8  PLT 265    CBG: No results for input(s): GLUCAP in the last 168 hours.  Family history.  Paternal aunt with mesothelioma as well as uncle.  Negative for esophageal cancer stomach cancer rectal cancer  Brief HPI:   John Mack is a 80 y.o. 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  Patient lives with spouse two-level home bed and bath main level.  Reportedly independent prior to admission caring for his wife who is wheelchair-bound.  Presented 01/30/2021 with persistent headache as well as decreased functional mobility.  X-rays and imaging revealed right panhemispheric chronic subdural hematoma.  Underwent right bur hole for subdural hematoma 02/07/2021 per Dr. Christella Noa.  Maintained on Keppra for seizure prophylaxis.  Hospital course episode presyncope associated PVCs identified on telemetry noted blood pressure 60/40 received IV fluid bolus.  Echocardiogram with ejection fraction of 50 to 55% moderately depressed RV systolic function cardiology service follow-up.  Initially maintained on amiodarone.  CT of the chest obtained on 02/10/2021 revealed acute bilateral PEs with saddle embolus at right pulmonary hilum extending into the right upper lobe and right middle lobe as well as  multiple small emboli within the right lower lobe, left lower lobe and left upper lobe pulmonary arteries.  Lower extremity Dopplers negative.  Patient was cleared to begin Eliquis for pulmonary emboli after initially being maintained on IV heparin.  Patient does have a history of persistent hyponatremia 125-131 and monitor.  Therapy evaluations completed due to patient's decreased functional mobility was admitted for a comprehensive rehab program.   Hospital Course: John Mack was admitted to rehab 02/16/2021 for inpatient therapies to consist of PT, ST and OT at least three hours five days a week. Past admission physiatrist, therapy team and rehab RN have worked together to provide customized collaborative inpatient rehab.  Pertaining to patient's chronic right subdural hematoma after fall status post bur hole 02/07/2021.  Site healing nicely headaches much improved patient would follow neurosurgery.  Hospital course bilateral pulmonary emboli identified on CT angiogram of the chest 02/10/2021 patient was cleared for Eliquis.  No bleeding episodes.  Blood pressures controlled and monitored on Norvasc as well as amiodarone with Toprol 25 mg twice daily.  Patient would follow-up cardiology services.  Keppra ongoing for seizure prophylaxis no seizure activity and tapered off on at discharge.Marland Kitchen  He did have some mild anxiety using Ativan as directed.  History of asthma no shortness of breath inhalers as directed.  Hypothyroidism with Synthroid as directed.  Persistent chronic hyponatremia stable with latest sodium 130 and would need outpatient follow-up.   Blood pressures were monitored on TID basis and controlled     Rehab course: During patient's stay in rehab weekly team conferences were held to monitor patient's progress, set goals and discuss barriers to discharge. At admission, patient required minimal assist 95 feet rolling walker minimal assist stand pivot transfers minimal assist sit to stand  minimal assist upper body bathing minimal assist lower body bathing supervision upper body dressing minimal assist lower body dressing   Physical exam.  Blood pressure 146/83 pulse 67 temperature 98.8 respirations 17 oxygen saturation 96% room air Constitutional.  No acute distress HEENT Head.  Normocephalic and atraumatic Eyes.  Pupils round and reactive to light no discharge that nystagmus Neck.  Supple nontender no JVD without thyromegaly Cardiac regular rate rhythm without extra sounds or murmur heard Abdomen.  Soft nontender positive bowel sounds not rebound Respiratory effort normal no respiratory distress without wheeze Skin.  Right scalp incision site clean and dry Neurologic.  Alert no acute distress follows commands oriented x3 motor strength 4+/5 throughout  He/She  has had improvement in activity tolerance, balance, postural control as well as ability to compensate for deficits. He/She has had improvement in functional use RUE/LUE  and RLE/LLE as well as improvement in awareness.  Supine to sit with supervision STS throughout session with minimal guard contact-guard assist ambulating 150 feet contact-guard assist rolling walker.  He can increase his ambulation up to 300 feet.  Completed oral care and dressing activity seated edge of bed.  Completed standing balance reaching and trunk mobility with contact-guard assist.  He did have some mild delay in reasoning problem-solving and awareness as discussed with family and plan for supervision on discharge.  Full family teaching completed plan discharged home       Disposition: Discharged to home    Diet: Regular  Special Instructions: No driving smoking or alcohol  Patient should have follow-up chemistries in 1 week to monitor sodium  Medications at discharge 1.  Tylenol as needed 2.  Albuterol inhaler 1 puff every 6 hours as needed 3.  Amiodarone 200 mg p.o. daily 4.  Norvasc 2.5 mg p.o. daily 5.  Eliquis 5 mg p.o. twice  daily 6.  Colace 100 mg p.o. twice daily 7.  Hydrocodone 1 tablet every 8 hours as needed pain 8.  Synthroid 112 mcg p.o. daily 9.  Ativan 1 mg p.o. twice daily and 0.5 mg daily after lunch 10.  Toprol-XL 25 mg p.o. twice daily 11.  Dulera 2 puffs twice daily 12.  Sodium chloride tablets 2 g p.o. twice daily 13.  Symbicort 2 puffs daily as needed 14.  Pravachol 20 mg p.o. daily   30-35 minutes were spent completing discharge summary and discharge planning  Discharge Instructions    Ambulatory referral to Physical Medicine Rehab   Complete by: As directed    Moderate complexity follow-up transition care chronic subdural hematoma       Follow-up Information    Raulkar, Clide Deutscher, MD Follow up.   Specialty: Physical Medicine and Rehabilitation Why: 03/04/21 please arrive at 9:40 for 10:00am appointment Contact information: [address] [place] [address] [phone]        Ashok Pall, MD Follow up.   Specialty: Neurosurgery Why: Call for appointment Contact information: [address] [place] [address] [place] [address] [phone]  Prince Solian, MD Follow up.   Specialty: Internal Medicine Contact information: [place] [address] [phone]        Sueanne Margarita, MD .   Specialty: Cardiology Contact information: [phone] [address] [place] [address] [phone]               Signed: Cathlyn Parsons 02/22/2021, 5:13 AM

## 2021-02-21 NOTE — Progress Notes (Signed)
Occupational Therapy Discharge Summary  Patient Details  Name: John Mack MRN: [id number] Date of Birth: Apr 28, 1941  Patient has met 8 of 10 long term goals due to improved activity tolerance, improved balance, postural control, ability to compensate for deficits and improved awareness.  Patient to discharge at overall Supervision level for self-care tasks, Mod I toilet transfers.  Patient's care partner unavailable to provide the necessary  assistance at discharge.  Patient was primary caregiver for his wife.  Patient reports nurse to continue to take care of wife and will be around to provide intermittent supervision for patient initially upon d/c.  Pt also reports that his sister in law lives across the road and can provide meals, plan to sign up for meals on wheels, and that he has other friends who can provide assist/support as needed.  Reasons goals not met: pt continues to require supervision for toileting hygiene. Pt requires CGA for shower transfers at this time, however pt reports plan to continue "bird baths" until more stable.  Recommendation:  Patient will benefit from ongoing skilled OT services in home health setting to continue to advance functional skills in the area of BADL and Reduce care partner burden.  Equipment: 3 in 1 and shower chair  Reasons for discharge: treatment goals met and discharge from hospital  Patient/family agrees with progress made and goals achieved: Yes  OT Discharge Precautions/Restrictions  Precautions Precautions: Fall Restrictions Weight Bearing Restrictions: No Pain Pain Assessment Pain Scale: 0-10 Pain Score: 0-No pain ADL ADL Eating: Independent Where Assessed-Eating: Edge of bed Grooming: Setup Where Assessed-Grooming: Standing at sink Upper Body Bathing: Setup Where Assessed-Upper Body Bathing: Edge of bed Lower Body Bathing: Setup Where Assessed-Lower Body Bathing: Edge of bed Upper Body Dressing: Setup Where  Assessed-Upper Body Dressing: Edge of bed Lower Body Dressing: Supervision/safety Where Assessed-Lower Body Dressing: Edge of bed Toileting: Minimal assistance Toilet Transfer: Modified independent Armed forces technical officer Method: Counselling psychologist: Geophysical data processor: Curator Method: Heritage manager: Grab bars Vision Baseline Vision/History: Wears glasses Wears Glasses: At all times Patient Visual Report: No change from baseline Vision Assessment?: No apparent visual deficits Visual Fields: No apparent deficits Perception  Perception: Within Functional Limits Praxis Praxis: Intact Cognition Overall Cognitive Status: Within Functional Limits for tasks assessed Arousal/Alertness: Awake/alert Orientation Level: Oriented X4 Attention: Sustained Sustained Attention: Appears intact (hyperverbal) Memory: Appears intact Awareness: Appears intact Problem Solving: Appears intact Safety/Judgment: Appears intact Comments: pt is unaware of deficits Sensation Sensation Light Touch: Appears Intact Proprioception: Appears Intact Coordination Gross Motor Movements are Fluid and Coordinated: No Fine Motor Movements are Fluid and Coordinated: No Coordination and Movement Description: mild uncoordination due to decreased balance/postural control, generalized weakness, and decreased endurance Finger Nose Finger Test: mild tremors in LUE Heel Shin Test: [facility] bilaterally Motor  Motor Motor: Within Functional Limits Motor - Skilled Clinical Observations: generalized weakness and poor endurance to activity Mobility  Bed Mobility Bed Mobility: Rolling Right;Rolling Left;Sit to Supine;Supine to Sit Rolling Right: Independent Rolling Left: Independent Supine to Sit: Independent with assistive device Sit to Supine: Independent with assistive device Transfers Sit to Stand: Supervision/Verbal cueing Stand to Sit:  Supervision/Verbal cueing  Trunk/Postural Assessment  Cervical Assessment Cervical Assessment: Within Functional Limits Thoracic Assessment Thoracic Assessment: Exceptions to [facility] (mild kyphosis) Lumbar Assessment Lumbar Assessment: Exceptions to [facility] (posterior pelvic tilt) Postural Control Postural Control: Within Functional Limits  Balance Balance Balance Assessed: Yes Static Sitting Balance Static Sitting - Balance Support: Feet  supported;Bilateral upper extremity supported Static Sitting - Level of Assistance: 7: Independent Dynamic Sitting Balance Dynamic Sitting - Balance Support: Feet supported;No upper extremity supported Dynamic Sitting - Level of Assistance: 7: Independent Static Standing Balance Static Standing - Balance Support: Bilateral upper extremity supported (RW) Static Standing - Level of Assistance: 5: Stand by assistance (supervision) Dynamic Standing Balance Dynamic Standing - Balance Support: Bilateral upper extremity supported (supervision) Dynamic Standing - Level of Assistance: 5: Stand by assistance (supervision) Extremity/Trunk Assessment RUE Assessment RUE Assessment: Within Functional Limits General Strength Comments: grossly 4 to 4+/5 throughout LUE Assessment LUE Assessment: Within Functional Limits General Strength Comments: grossly 4 to 4+/5 throughout   [place], [facility] 02/21/2021, 9:50 AM

## 2021-02-22 LAB — BASIC METABOLIC PANEL
Anion gap: 8 (ref 5–15)
BUN: 6 mg/dL — ABNORMAL LOW (ref 8–23)
CO2: 25 mmol/L (ref 22–32)
Calcium: 8.7 mg/dL — ABNORMAL LOW (ref 8.9–10.3)
Chloride: 95 mmol/L — ABNORMAL LOW (ref 98–111)
Creatinine, Ser: 0.77 mg/dL (ref 0.61–1.24)
GFR, Estimated: >60 mL/min (ref >60)
Glucose, Bld: 132 mg/dL — ABNORMAL HIGH (ref 70–99)
Potassium: 3 mmol/L — ABNORMAL LOW (ref 3.5–5.1)
Sodium: 128 mmol/L — ABNORMAL LOW (ref 135–145)

## 2021-02-22 MED ORDER — POTASSIUM CHLORIDE 20 MEQ PO PACK
40.0000 meq | PACK | Freq: Once | ORAL | Status: DC
  Filled 2021-02-22: qty 2

## 2021-02-22 NOTE — Progress Notes (Signed)
PROGRESS NOTE   Subjective/Complaints: Ready for d/c today!  Discussed low Na and K+- continue Na supplementation at home and supplement 29meq potassium today- patient states he eats a banana every day at home and encouraged this, also discussed other high potassium foods.   ROS: Denies CP, SOB, N/V/D, pain  Objective:   No results found. No results for input(s): WBC, HGB, HCT, PLT in the last 72 hours. Recent Labs    02/22/21 0019  NA 128*  K 3.0*  CL 95*  CO2 25  GLUCOSE 132*  BUN 6*  CREATININE 0.77  CALCIUM 8.7*    Intake/Output Summary (Last 24 hours) at 02/22/2021 0919 Last data filed at 02/22/2021 0748 Gross per 24 hour  Intake 580 ml  Output 2875 ml  Net -2295 ml        Physical Exam: Vital Signs Blood pressure (!) 150/80, pulse 64, temperature 99.2 F (37.3 C), temperature source Oral, resp. rate 18, height 5\' 10"  (1.778 m), SpO2 98 %. Gen: no distress, normal appearing HEENT: oral mucosa pink and moist, NCAT Cardio: Reg rate Chest: normal effort, normal rate of breathing Abd: soft, non-distended Ext: no edema Psych: pleasant, normal affect Skin:  Right scalp incision with staples CDI Psych: Normal mood.  Normal behavior. Musc: No edema in extremities.  No tenderness in extremities. Neuro: Alert Makes eye contact with examiner.   Follows simple commands.   Provides name age and place.   Motor: 4+/5 throughout , unchanged  Assessment/Plan: 1. Functional deficits which require 3+ hours per day of interdisciplinary therapy in a comprehensive inpatient rehab setting.  Physiatrist is providing close team supervision and 24 hour management of active medical problems listed below.  Physiatrist and rehab team continue to assess barriers to discharge/monitor patient progress toward functional and medical goals  Care Tool:  Bathing    Body parts bathed by patient: Right arm,Left  arm,Chest,Abdomen,Front perineal area,Buttocks,Right upper leg,Left upper leg,Right lower leg,Left lower leg,Face         Bathing assist Assist Level: Set up assist     Upper Body Dressing/Undressing Upper body dressing   What is the patient wearing?: Pull over shirt    Upper body assist Assist Level: Set up assist    Lower Body Dressing/Undressing Lower body dressing      What is the patient wearing?: Pants     Lower body assist Assist for lower body dressing: Supervision/Verbal cueing     Toileting Toileting    Toileting assist Assist for toileting: Moderate Assistance - Patient 50 - 74%     Transfers Chair/bed transfer  Transfers assist     Chair/bed transfer assist level: Supervision/Verbal cueing     Locomotion Ambulation   Ambulation assist   Ambulation activity did not occur: Safety/medical concerns  Assist level: Supervision/Verbal cueing Assistive device: Walker-rolling Max distance: 150   Walk 10 feet activity   Assist     Assist level: Supervision/Verbal cueing Assistive device: Walker-rolling   Walk 50 feet activity   Assist Walk 50 feet with 2 turns activity did not occur: Safety/medical concerns (fatigue)  Assist level: Supervision/Verbal cueing Assistive device: Walker-rolling    Walk 150 feet activity  Assist Walk 150 feet activity did not occur: Safety/medical concerns (fatigue)  Assist level: Supervision/Verbal cueing Assistive device: Walker-rolling    Walk 10 feet on uneven surface  activity   Assist     Assist level: Minimal Assistance - Patient > 75% Assistive device: Hand held assist   Wheelchair     Assist Will patient use wheelchair at discharge?: No Type of Wheelchair: Manual    Wheelchair assist level: Supervision/Verbal cueing Max wheelchair distance: 150    Wheelchair 50 feet with 2 turns activity    Assist        Assist Level: Supervision/Verbal cueing   Wheelchair 150 feet  activity     Assist      Assist Level: Supervision/Verbal cueing   Blood pressure (!) 150/80, pulse 64, temperature 99.2 F (37.3 C), temperature source Oral, resp. rate 18, height 5\' 10"  (1.778 m), SpO2 98 %.    Medical Problem List and Plan: 1.  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  Status post bur hole 02/07/2021  Dc home today.  2.  Antithrombotics: Bilateral pulmonary emboli identified on CT angiogram of the chest 02/10/2021 -DVT/anticoagulation: Continue Eliquis             -antiplatelet therapy: N/A 3. Pain Management:   Weaned Norco to q8H prn.    Controlled on 3/15- may give one week supply of Norco on discharge.             Monitor with increased exertion, particularly for vascular headaches 4. Mood: Xanax 1 mg nightly as needed             -antipsychotic agents: N/A 5. Neuropsych: This patient is capable of making decisions on his own behalf. 6. Skin/Wound Care: Routine skin checks  D/c staples today 7. Fluids/Electrolytes/Nutrition: Routine in and outs 8.  Hypertension with frequent PVCs.  Patient cleared to begin low-dose Norvasc 2.5 mg daily as well as home amiodarone 200 mg daily and Toprol 25 mg twice daily  Borderline elevated on 3/12             Monitor with increased exertion 9.  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  Ejection fraction 45%.  Followed at [facility].  Follow-up cardiology services as needed 10.  Seizure prophylaxis.  Wean Keppra to 500mg  daily; will d/c prior to discharge 11.  Hypothyroidism: Synthroid 12.  GERD.  Protonix 13.  Asthma.  Dulera 2 puffs twice daily  Monitor with increased exertion, controlled at present 14.  Persistent hyponatremia: Continue sodium chloride tablets             sodium 130 on 3/14, 128 on 3/15, continue supplementation outpatient. 15. Hypokalemia:  Potassium 4.0 on 3/12 after supplementation, 3  on 3/15- supplement today 15. Disposition: plan for d/c today- discussed with team. Patient cares for his wife who is in critical care right now. Discussed importance of continuing nursing care for wife so he will have to provide her less assistance and can focus on his own recovery.    >30 minutes spent in discharge of patient including review of medications and follow-up appointments, physical examination, and in answering all patient's questions   LOS: 6 days A FACE TO [place] 02/22/2021, 9:19 AM

## 2021-02-22 NOTE — Progress Notes (Signed)
Pt discharged home with family.  Discharge instructions given by Linna Hoff, PA.  No further questions from patient.  Belongings sent with patient. Octavia Bruckner, RN

## 2021-02-22 NOTE — Plan of Care (Signed)
  Problem: Consults Goal: RH BRAIN INJURY PATIENT EDUCATION Description: Description: See Patient Education module for eduction specifics Outcome: Completed/Met Goal: Skin Care Protocol Initiated - if Braden Score 18 or less Description: If consults are not indicated, leave blank or document N/A Outcome: Completed/Met   Problem: RH BOWEL ELIMINATION Goal: RH STG MANAGE BOWEL WITH ASSISTANCE Description: STG Manage Bowel with supervision Assistance. Outcome: Completed/Met Goal: RH STG MANAGE BOWEL W/MEDICATION W/ASSISTANCE Description: STG Manage Bowel with Medication with supervision Assistance. Outcome: Completed/Met   Problem: RH BLADDER ELIMINATION Goal: RH STG MANAGE BLADDER WITH ASSISTANCE Description: STG Manage Bladder With supervision Assistance Outcome: Completed/Met   Problem: RH SKIN INTEGRITY Goal: RH STG ABLE TO PERFORM INCISION/WOUND CARE W/ASSISTANCE Description: STG Able To Perform Incision/Wound Care With supervision Assistance. Outcome: Completed/Met   Problem: RH SAFETY Goal: RH STG ADHERE TO SAFETY PRECAUTIONS W/ASSISTANCE/DEVICE Description: STG Adhere to Safety Precautions With supervision Assistance/Device. Outcome: Completed/Met   Problem: RH COGNITION-NURSING Goal: RH STG USES MEMORY AIDS/STRATEGIES W/ASSIST TO PROBLEM SOLVE Description: STG Uses Memory Aids/Strategies With cues/reminders to Problem Solve. Outcome: Completed/Met Goal: RH STG ANTICIPATES NEEDS/CALLS FOR ASSIST W/ASSIST/CUES Description: STG Anticipates Needs/Calls for Assist With supervision Assistance/Cues. Outcome: Completed/Met   Problem: RH PAIN MANAGEMENT Goal: RH STG PAIN MANAGED AT OR BELOW PT'S PAIN GOAL Description: <3 on a 0-10 pain scale. Outcome: Completed/Met   Problem: RH KNOWLEDGE DEFICIT BRAIN INJURY Goal: RH STG INCREASE KNOWLEDGE OF SELF CARE AFTER BRAIN INJURY Description: Patient will be able to demonstrate knowledge of medication management, bowel/bladder  management, pain management, skin/wound care, safety awareness with educational materials and handouts provided by staff. Outcome: Completed/Met

## 2021-02-23 ENCOUNTER — Telehealth: Payer: Self-pay | Admitting: *Deleted

## 2021-02-23 ENCOUNTER — Other Ambulatory Visit: Payer: Self-pay | Admitting: Physical Medicine and Rehabilitation

## 2021-02-23 MED ORDER — POTASSIUM CHLORIDE ER 10 MEQ PO TBCR: 10.0000 meq | EXTENDED_RELEASE_TABLET | Freq: Every day | ORAL | 1 refills | Status: DC

## 2021-02-23 NOTE — Telephone Encounter (Signed)
John Mack called about his DME that he is supposed to receive.  He though he was told they would bring to his room before he left inpt rehab but that did not happen. He is requesting help because he needs the items (shower chair, bedside toilet and walker.  Transitional Care call--who you talked John Mack    1. Are you/is patient experiencing any problems since coming home? Are there any questions regarding any aspect of care? NO 2. Are there any questions regarding medications administration/dosing? Are meds being taken as prescribed? Patient should review meds with caller to confirm I have confirmed he has everything but his sodium tablets.  He will check with [facility] about this because Epic shows receipt confirmed by pharmacy. HE ALSO SAYS DR Ranell Patrick MENTIONED A PILL FOR POTASSIUM BUT IT IS NOT ON THE LIST. I WILL SEND HER A MESSAGE TO INQUIRE. 3. Have there been any falls? NO 4. Has Home Health been to the house and/or have they contacted you? If not, have you tried to contact them? Can we help you contact them?[facility] has not called as of yet.  5. Are bowels and bladder emptying properly? Are there any unexpected incontinence issues? If applicable, is patient following bowel/bladder programs?NO PROBLEMS 6. Any fevers, problems with breathing, unexpected pain? NO AND NOT REALLY TAKING ANYTHING BUT TYLENOL FOR PAIN 7. Are there any skin problems or new areas of breakdown? NO 8. Has the patient/family member arranged specialty MD follow up (ie cardiology/neurology/renal/surgical/etc)?  Can we help arrange?APPT GIVEN 9. Does the patient need any other services or support that we can help arrange? I HAVE GIVEN HIM THE NUMBER FOR [facility] TO PAY HIS COPAYS TO GET HIS DME DELIVERED. 10. Are caregivers following through as expected in assisting the patient? YES HE HAS WIFE AND A RETIRED RN LOOKING AFTER HIM. 11. Has the patient quit smoking, drinking alcohol, or using drugs as recommended?  YES  Appointment 03/04/21@10 :00 WITH DR Ranell Patrick, ARRIVE BY 9:30 REMINDED TO LOOK FOR PACKET IN MAILBOX FROM OUR OFFICE, FILL OUT PAPERS AND BRING TO APPT.  [name]

## 2021-02-23 NOTE — Telephone Encounter (Signed)
Mr Petrow says that you mentioned to him about taking something for his potassium but there is nothing ordered. He should be getting his sodium pills today.

## 2021-02-23 NOTE — Telephone Encounter (Signed)
Just sent daily supplement for him!

## 2021-03-04 ENCOUNTER — Encounter
Payer: Medicare Other | Attending: Physical Medicine and Rehabilitation | Admitting: Physical Medicine and Rehabilitation

## 2021-03-04 ENCOUNTER — Other Ambulatory Visit: Payer: Self-pay

## 2021-03-04 VITALS — BP 138/84 | HR 67 | Temp 97.9°F | Ht 70.0 in | Wt 203.0 lb

## 2021-03-04 DIAGNOSIS — E871 Hypo-osmolality and hyponatremia: Secondary | ICD-10-CM

## 2021-03-04 DIAGNOSIS — S065X9A Traumatic subdural hemorrhage with loss of consciousness of unspecified duration, initial encounter: Secondary | ICD-10-CM | POA: Insufficient documentation

## 2021-03-04 DIAGNOSIS — E663 Overweight: Secondary | ICD-10-CM | POA: Insufficient documentation

## 2021-03-04 DIAGNOSIS — I10 Essential (primary) hypertension: Secondary | ICD-10-CM

## 2021-03-04 DIAGNOSIS — S065XAA Traumatic subdural hemorrhage with loss of consciousness status unknown, initial encounter: Secondary | ICD-10-CM

## 2021-03-04 DIAGNOSIS — E559 Vitamin D deficiency, unspecified: Secondary | ICD-10-CM | POA: Diagnosis not present

## 2021-03-04 NOTE — Patient Instructions (Signed)
-  Advised regarding healthy foods that can help lower blood pressure and provided with a list: 1) citrus foods 2) salmon and other fatty fish 3) swiss chard (leafy green) 4) pumpkin seeds 5) Beans and lentils 6) Berries 7) Amaranth (whole grain, can be cooked similarly to rice and oats) 8) Pistachios 9) Carrots 10) Celery 11) Tomatoes 12) Broccoli 13) Greek yogurt 14) Herbs and spices: Celery seed, cilantro, saffron, lemongrass, black cumin, ginseng, cinnamon, cardamom, sweet basil, and ginger 15) Chia and flax seeds 16) Beets 17) spinach -Educated that goal BP is 120/80. -Made goal to incorporate some of the above foods into her diet.

## 2021-03-04 NOTE — Progress Notes (Signed)
Subjective:    Patient ID: John Mack, male    DOB: 10/26/1941, 80 y.o.   MRN: [id number]  HPI  1) Subdural hematoma: -He is recovering very well -walking for an hour per day! -to start home therapy soon -has been lifting wife. Has assistance at home now. -has had follow-up with Dr. Christella Mack   2) Denies pain.  3) Hyponatremia: -he has been taking salt pills  4) Overweight: BMI 29.13, weight 203 lbs.  -he aims to get back to running.   3) Hypokalemia: -he has been taking the K+ supplement -has been eating a banana every morning.   Pain Inventory Average Pain 0 Pain Right Now 0 My pain is no pain  LOCATION OF PAIN  na  BOWEL Number of stools per week: 7-10 Oral laxative use No  Type of laxative na Enema or suppository use No  History of colostomy No  Incontinent No   BLADDER Normal In and out cath, frequency na Able to self cath na Bladder incontinence No  Frequent urination No  Leakage with coughing No  Difficulty starting stream No  Incomplete bladder emptying No    Mobility walk with assistance use a walker how many minutes can you walk? an hour/10 mins a day without walker ability to climb steps?  yes do you drive?  no  Function retired  Neuro/Psych trouble walking  Prior Studies TC appt   Physicians involved in your care TC appt   Family History  Problem Relation Age of Onset  . Mesothelioma Paternal Aunt   . Mesothelioma Paternal Uncle   . Colon cancer Neg Hx   . Esophageal cancer Neg Hx   . Stomach cancer Neg Hx   . Rectal cancer Neg Hx    Social History   Socioeconomic History  . Marital status: Married    Spouse name: Not on file  . Number of children: Not on file  . Years of education: Not on file  . Highest education level: Not on file  Occupational History  . Not on file  Tobacco Use  . Smoking status: Never Smoker  . Smokeless tobacco: Never Used  Vaping Use  . Vaping Use: Never used  Substance and  Sexual Activity  . Alcohol use: No    Alcohol/week: 0.0 standard drinks  . Drug use: No  . Sexual activity: Not Currently  Other Topics Concern  . Not on file  Social History Narrative   Married his wife is an invalid and he provides full-time care   No alcohol no drugs and never smoker   Social Determinants of Radio broadcast assistant Strain: Not on file  Food Insecurity: Not on file  Transportation Needs: Not on file  Physical Activity: Not on file  Stress: Not on file  Social Connections: Not on file   Past Surgical History:  Procedure Laterality Date  . APPENDECTOMY    . BURR HOLE Right 02/07/2021   Procedure: Right Burr holes for subdural hematoma;  Surgeon: John Pall, MD;  Location: [facility];  Service: Neurosurgery;  Laterality: Right;  Right Burr holes for subdural hematoma  . COLONOSCOPY  10/02/2006, 12/18/2011  . ESOPHAGOSCOPY  12/18/2011   with Maloney dilation  . KNEE ARTHROSCOPY     both knees  . SHOULDER ARTHROSCOPY     left shoulder, both knees  . SHOULDER SURGERY     right surgery  . TONSILLECTOMY    . UPPER GASTROINTESTINAL ENDOSCOPY  02/04/2009   with John Mack  dilation   Past Medical History:  Diagnosis Date  . Allergy   . Anxiety   . Arthritis   . Asthma   . Cancer ([place])    skin  . CHF (congestive heart failure) (H[facility])    chornic systolic CHF  . DCM (dilated cardiomyopathy) ([place]) 04/18/2017   ED 45% by echo at [facility]  . Depression   . Diverticulosis   . Dysrhythmia   . Eosinophilic esophagitis 7/32/2025   EGD 04/2014 17 Eos/hpf esophageal bxs  . Eye abnormalities    calcium deposits behind eyes  . Frequent PVCs   . GERD (gastroesophageal reflux disease)   . Hx of colonic polyps 12/18/2011  . Hyperlipidemia   . Hypertension   . Hypothyroidism   . SDH (subdural hematoma) ([place]) 11/30/2020  . 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 Thyroid disease    hypothyroid   BP 138/84   Pulse 67   Temp 97.9 F (36.6 C)   Ht 5\' 10"  (1.778 m)   Wt 203 lb (92.1 kg)    SpO2 95%   BMI 29.13 kg/m   Opioid Risk Score:   Fall Risk Score:  `1  Depression screen PHQ 2/9  Depression screen PHQ 2/9 03/04/2021  Decreased Interest 0  Down, Depressed, Hopeless 0  PHQ - 2 Score 0  Altered sleeping 0  Tired, decreased energy 0  Change in appetite 0  Feeling bad or failure about yourself  0  Trouble concentrating 0  Moving slowly or fidgety/restless 0  Suicidal thoughts 0  PHQ-9 Score 0    Review of Systems  All other systems reviewed and are negative.      Objective:   Physical Exam  Gen: no distress, normal appearing HEENT: oral mucosa pink and moist, NCAT Cardio: Reg rate Chest: normal effort, normal rate of breathing Abd: soft, non-distended Ext: no edema Psych: pleasant, normal affect Skin: intact Neuro: Alert and oriented x3 Musculoskeletal: 5/5 strength throughout    Assessment & Plan:  [name] presents for a transitional care follow-up after CIR admission for a subdural hematoma.   1) Impaired mobility and ADLs: -He is progressing very well! -Start home therapy. Asked him to call me if he does not hear from them by next week.  2) Hyponatremia: -Repeat level today -I will call him tomorrow with the result and advise regarding plan for salt tablets accordingly.   3) Hypokalemia: -Check K+ today and I will call him tomorrow with the result and advise regarding plan for K+ supplementation accordingly.  -Continue banana daily -Add nuts.   4) Given prolonged hospitalization, would recommend checking vitamin D level.   5) Overweight (BMI 29.13): he has a high muscle mass- advised that this could be contributory.   Current impairments:  Patient will require home OT and PT; orders have been placed  The patient's medical and/or psychosocial problems require moderate decision-making during transitions in care from inpatient rehabilitation to home. Interactive contact took place within 2 business days following discharge from  inpatient rehab. This was a face-to-face visit with me on 03/04/21 within 7-14 calendar days of the patient's discharge. Given low Na and K+ requiring supplementation, check soon after hospital was beneficial. This transitional care appointment included review of the patient's hospital discharge summary, review of the patient's hospital diagnostic tests and discussion of appropriate follow-up, education of the patient regarding their condition, re-establishment of necessary referrals. I will be reviewing patient's home and/or outpatient therapy notes as they progress through  therapy and corresponding with therapists accordingly. I have encouraged compliance with current medication regimen (with adjustment to regimen as needed), follow-up with necessary providers, and the importance of following a healthy diet and exercise routine to maximize recovery, health, and quality of life.

## 2021-03-05 LAB — VITAMIN D 25 HYDROXY (VIT D DEFICIENCY, FRACTURES): Vit D, 25-Hydroxy: 25.9 ng/mL — ABNORMAL LOW (ref 30.0–100.0)

## 2021-03-05 LAB — BASIC METABOLIC PANEL
BUN/Creatinine Ratio: 14 (ref 10–24)
BUN: 10 mg/dL (ref 8–27)
CO2: 23 mmol/L (ref 20–29)
Calcium: 8.9 mg/dL (ref 8.6–10.2)
Chloride: 98 mmol/L (ref 96–106)
Creatinine, Ser: 0.72 mg/dL — ABNORMAL LOW (ref 0.76–1.27)
Glucose: 88 mg/dL (ref 65–99)
Potassium: 4.2 mmol/L (ref 3.5–5.2)
Sodium: 136 mmol/L (ref 134–144)
eGFR: 94 mL/min/{1.73_m2} (ref >59)

## 2021-03-07 ENCOUNTER — Telehealth: Payer: Self-pay | Admitting: Physical Medicine and Rehabilitation

## 2021-03-07 NOTE — Telephone Encounter (Signed)
PT came and did an assessment and Sharyn Lull a PT came out and said John Mack would be coming but needs an order for it. If there can be an order in so he can "get back into shape" per the patient

## 2021-03-09 ENCOUNTER — Telehealth: Payer: Self-pay

## 2021-03-09 NOTE — Telephone Encounter (Signed)
John Mack would like results from his Na+ and K+ lab work he states he is low on pills

## 2021-03-10 MED ORDER — SODIUM CHLORIDE 1 G PO TABS: 2.0000 g | ORAL_TABLET | Freq: Two times a day (BID) | ORAL | 0 refills | Status: DC

## 2021-03-10 NOTE — Telephone Encounter (Signed)
Pt called the office again concerned  about his sodium levels I told him we had sent message to Dr.Raulkar but wont be in office until Monday so we asked dr.Patel and per Dr.Patel its okay to refill sodium. Still would like a call back to go over sodium levels.

## 2021-03-12 NOTE — Telephone Encounter (Signed)
Will call on Monday.

## 2021-03-13 ENCOUNTER — Other Ambulatory Visit: Payer: Self-pay | Admitting: Physical Medicine and Rehabilitation

## 2021-03-13 MED ORDER — VITAMIN D (ERGOCALCIFEROL) 1.25 MG (50000 UNIT) PO CAPS: 50000.0000 [IU] | ORAL_CAPSULE | ORAL | 0 refills | Status: DC

## 2021-03-17 ENCOUNTER — Encounter: Payer: Self-pay | Admitting: Pulmonary Disease

## 2021-03-17 ENCOUNTER — Other Ambulatory Visit: Payer: Self-pay

## 2021-03-17 ENCOUNTER — Ambulatory Visit (INDEPENDENT_AMBULATORY_CARE_PROVIDER_SITE_OTHER): Payer: Medicare Other | Admitting: Pulmonary Disease

## 2021-03-17 VITALS — BP 118/78 | HR 66 | Temp 97.6°F | Ht 70.0 in | Wt 205.4 lb

## 2021-03-17 DIAGNOSIS — I2699 Other pulmonary embolism without acute cor pulmonale: Secondary | ICD-10-CM

## 2021-03-17 DIAGNOSIS — I2609 Other pulmonary embolism with acute cor pulmonale: Secondary | ICD-10-CM

## 2021-03-17 MED ORDER — SPIRIVA RESPIMAT 1.25 MCG/ACT IN AERS: 2.0000 | INHALATION_SPRAY | Freq: Every day | RESPIRATORY_TRACT | 11 refills | Status: AC

## 2021-03-17 NOTE — Patient Instructions (Signed)
Nice to meet you  I am glad you are feeling better.  I am pretty confident that the blood thinner in your body are getting rid of this blood clot in the lungs.  At abundance of caution, I recommend we repeat a heart ultrasound in early June to make sure the extra pressure put on your heart from the blood clot has gotten better.  I will see you shortly thereafter in clinic to discuss the results.  I refilled the Spiriva, it should be ready for you to pick up shortly.  Return to clinic in 2 months for follow-up with Dr. Silas Flood after echocardiogram performed

## 2021-03-18 ENCOUNTER — Other Ambulatory Visit: Payer: Self-pay | Admitting: Physical Medicine and Rehabilitation

## 2021-03-20 NOTE — Progress Notes (Signed)
_0  ID: John Mack, male    DOB: [birth year], 80 y.o.   MRN: [id number]  Chief Complaint  Patient presents with  . New Patient (Initial Visit)    Doing ok, no new issues    Referring provider: Prince Solian, MD  HPI:   80 year old whom I am seeing in follow-up from the hospital with submassive PE.  Multiple pulmonary notes for consultation in the hospital reviewed.  Discharge summary reviewed.  Patient fell in January.  Over the course of the next few weeks developed worsening gait imbalance.  Seen by neurosurgeon who ordered CT head which revealed subdural hematoma.  He was hospitalized and then had bur hole placed given symptoms.  He is recovered in the hospital.  Had a episode of syncope or presyncope on the floor.  Is found to have large clot burden PE.  Lytics not given in setting of subdural hematoma recent evacuation.  Placed on blood thinners.  He slowly improved.  Patient oral anticoagulation.  He continues take this.  Denies any missed dose.  Overall symptoms much improved.  No dyspnea.  No bleeding issues.  CT angio PE protocol of the chest 02/10/2021 reviewed and interpreted as clear lungs and bilateral right greater than left pulmonary emboli.  PMH: Asthma, GERD, anxiety, hypothyroid Surgical history: Appendectomy, shoulder surgery, bur hole 01/18/2021 in setting of subdural hematoma Family history: Denies significant respiratory illnesses in first-degree relatives Social history: Retired, never smoker, lives in [place] / Pulmonary Flowsheets:   ACT:  No flowsheet data found.  MMRC: No flowsheet data found.  Epworth:  No flowsheet data found.  Tests:   FENO:  No results found for: NITRICOXIDE  PFT: No flowsheet data found.  WALK:  No flowsheet data found.  Imaging: Personally reviewed and as per EMR discussion this note  Lab Results: Personally reviewed CBC    Component Value Date/Time   WBC 6.5 02/17/2021 0024   RBC  3.49 (L) 02/17/2021 0024   HGB 11.3 (L) 02/17/2021 0024   HCT 31.0 (L) 02/17/2021 0024   PLT 265 02/17/2021 0024   MCV 88.8 02/17/2021 0024   MCH 32.4 02/17/2021 0024   MCHC 36.5 (H) 02/17/2021 0024   RDW 13.5 02/17/2021 0024   LYMPHSABS 0.9 02/17/2021 0024   MONOABS 0.7 02/17/2021 0024   EOSABS 0.3 02/17/2021 0024   BASOSABS 0.0 02/17/2021 0024    BMET    Component Value Date/Time   NA 136 03/04/2021 1036   K 4.2 03/04/2021 1036   CL 98 03/04/2021 1036   CO2 23 03/04/2021 1036   GLUCOSE 88 03/04/2021 1036   GLUCOSE 132 (H) 02/22/2021 0019   BUN 10 03/04/2021 1036   CREATININE 0.72 (L) 03/04/2021 1036   CALCIUM 8.9 03/04/2021 1036   GFRNONAA >60 02/22/2021 0019   GFRAA  04/06/2009 1522    >60        The eGFR has been calculated using the MDRD equation. This calculation has not been validated in all clinical situations. eGFR's persistently <60 mL/min signify possible Chronic Kidney Disease.    BNP    Component Value Date/Time   BNP 88.5 02/10/2021 1831    ProBNP No results found for: PROBNP  Specialty Problems   None     No Known Allergies  Immunization History  Administered Date(s) Administered  . Influenza Split 08/14/2008, 09/06/2010, 09/06/2011, 09/10/2013, 09/30/2013, 09/15/2014, 08/05/2020  . Influenza, High Dose Seasonal PF 09/26/2013, 09/10/2014, 09/14/2015, 09/10/2016, 09/14/2017, 09/14/2017, 09/13/2018, 09/13/2018  . Influenza,  Quadrivalent, Recombinant, Inj, Pf 08/01/2019  . Influenza,inj,Quad PF,6+ Mos 09/15/2014  . Influenza,inj,quad, With Preservative 09/10/2017  . Influenza-Unspecified 09/10/2014, 10/01/2018, 09/11/2019, 10/08/2020  . PFIZER(Purple Top)SARS-COV-2 Vaccination 12/26/2019, 01/16/2020, 09/06/2020  . Pneumococcal Conjugate-13 09/10/2014  . Pneumococcal Polysaccharide-23 07/15/2007, 10/11/2009, 10/23/2013  . Tdap 03/09/2011, 06/01/2014  . Zoster 08/14/2008, 05/07/2017, 07/07/2017  . Zoster Recombinat (Shingrix) 05/07/2017,  07/07/2017    Past Medical History:  Diagnosis Date  . Allergy   . Anxiety   . Arthritis   . Asthma   . Cancer ([place])    skin  . CHF (congestive heart failure) (H[facility])    chornic systolic CHF  . DCM (dilated cardiomyopathy) ([place]) 04/18/2017   ED 45% by echo at [facility]  . Depression   . Diverticulosis   . Dysrhythmia   . Eosinophilic esophagitis 0/17/5102   EGD 04/2014 17 Eos/hpf esophageal bxs  . Eye abnormalities    calcium deposits behind eyes  . Frequent PVCs   . GERD (gastroesophageal reflux disease)   . Hx of colonic polyps 12/18/2011  . Hyperlipidemia   . Hypertension   . Hypothyroidism   . SDH (subdural hematoma) ([place]) 11/30/2020  . 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 Thyroid disease    hypothyroid    Tobacco History: Social History   Tobacco Use  Smoking Status Never Smoker  Smokeless Tobacco Never Used   Counseling given: Yes   Continue to not smoke  Outpatient Encounter Medications as of 03/17/2021  Medication Sig  . acetaminophen (TYLENOL) 325 MG tablet Take 2 tablets (650 mg total) by mouth every 4 (four) hours as needed for mild pain (temp > 100.5).  . Albuterol Sulfate, sensor, (PROAIR DIGIHALER) 108 (90 Base) MCG/ACT AEPB Inhale 1 puff into the lungs every 6 (six) hours as needed (shortness of breath).  . ALPRAZolam (XANAX) 0.5 MG tablet Take 2 tablets (1 mg total) by mouth at bedtime as needed for sleep.  Marland Kitchen alprazolam (XANAX) 2 MG tablet Take 2 mg by mouth at bedtime.  Marland Kitchen amiodarone (PACERONE) 200 MG tablet Take 1 tablet (200 mg total) by mouth daily.  Marland Kitchen amLODipine (NORVASC) 2.5 MG tablet Take 1 tablet (2.5 mg total) by mouth daily.  Marland Kitchen apixaban (ELIQUIS) 5 MG TABS tablet Take 1 tablet (5 mg total) by mouth 2 (two) times daily.  . cyanocobalamin (,VITAMIN B-12,) 1000 MCG/ML injection Inject 1,000 mcg into the muscle See admin instructions. Inject 1 mL (1029mg) intramuscularly every 30 days as needed for vitamin B12/energy.  . docusate sodium (COLACE) 100 MG capsule  Take 1 capsule (100 mg total) by mouth 2 (two) times daily.  .Marland Kitchenesomeprazole (NEXIUM) 40 MG capsule Take 1 capsule (40 mg total) by mouth 2 (two) times daily.  .Marland KitchenHYDROcodone-acetaminophen (NORCO/VICODIN) 5-325 MG tablet Take 1 tablet by mouth every 8 (eight) hours as needed for moderate pain.  .Marland Kitchenlevothyroxine (SYNTHROID) 112 MCG tablet Take 112 mcg by mouth every morning.  .Marland KitchenLORazepam (ATIVAN) 1 MG tablet Take 0.5-1 mg by mouth See admin instructions. Take 1 tablet (119m by mouth twice daily and 1/2 tablet (0.43m46mby mouth after lunch.  . metoprolol succinate (TOPROL-XL) 25 MG 24 hr tablet Take 1 tablet (25 mg total) by mouth 2 (two) times daily.  . montelukast (SINGULAIR) 10 MG tablet Take 1 tablet by mouth daily.  . Multiple Vitamins-Minerals (MULTIVITAMIN PO) Take 1 tablet by mouth daily.   . pantoprazole (PROTONIX) 40 MG tablet Take 1 tablet by mouth 2 (two) times daily.  .Marland Kitchen  Polyethyl Glycol-Propyl Glycol (SYSTANE OP) Place 1 drop into both eyes daily as needed (dry eyes).  . potassium chloride (KLOR-CON 10) 10 MEQ tablet Take 1 tablet (10 mEq total) by mouth daily.  . pravastatin (PRAVACHOL) 20 MG tablet Take 20 mg by mouth daily.  . sodium chloride 1 g tablet Take 2 tablets (2 g total) by mouth 2 (two) times daily.  . SYMBICORT 160-4.5 MCG/ACT inhaler Inhale 2 puffs into the lungs 2 (two) times daily as needed for shortness of breath.  . testosterone cypionate (DEPOTESTOTERONE CYPIONATE) 100 MG/ML injection Inject 200 mg into the muscle every 14 (fourteen) days. For IM use only  . Tiotropium Bromide Monohydrate (SPIRIVA RESPIMAT) 1.25 MCG/ACT AERS Inhale 2 puffs into the lungs daily.  . Vitamin D, Ergocalciferol, (DRISDOL) 1.25 MG (50000 UNIT) CAPS capsule Take 1 capsule (50,000 Units total) by mouth every 7 (seven) days.   No facility-administered encounter medications on file as of 03/17/2021.     Review of Systems  Review of Systems  No chest pain with exertion.  No orthopnea or PND.   Comprehensive review systems otherwise negative.  Physical Exam  BP 118/78 (BP Location: Left Arm, Cuff Size: Normal)   Pulse 66   Temp 97.6 F (36.4 C) (Oral)   Ht _0  (1.778 m)   Wt 205 lb 6.4 oz (93.2 kg)   SpO2 97%   BMI 29.47 kg/m   Wt Readings from Last 5 Encounters:  03/17/21 205 lb 6.4 oz (93.2 kg)  03/04/21 203 lb (92.1 kg)  02/14/21 213 lb 3 oz (96.7 kg)  12/02/19 202 lb (91.6 kg)  06/21/17 206 lb (93.4 kg)    BMI Readings from Last 5 Encounters:  03/17/21 29.47 kg/m  03/04/21 29.13 kg/m  02/16/21 30.59 kg/m  02/14/21 30.59 kg/m  12/02/19 30.05 kg/m     Physical Exam General: Well-appearing, no acute distress Eyes: EOMI, icterus Neck: Supple no JVP Cardiovascular: Regular rhythm, no murmur Pulmonary: Clear to auscultation bilaterally, no wheezes Abdomen: Nondistended, bowel sounds present MSK: No synovitis, joint effusion Neuro: No weakness, normal gait Psych: Normal mood, full affect   Assessment & Plan:   Submassive PE: In the setting of immobility while hospitalized and given chemoprophylaxis for DVT was contraindicated after admission for subdural hematoma.  Provoked.  Plan for 3 months of anticoagulation.  Improved symptoms.  Repeat TTE ordered for 05/2021 for 79-monthfollow-up to evaluate RV function, anticipate will be improved.  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  TTE ordered as above for reevaluation.  Asthma: Continue Symbicort has been on Spiriva in the past.  Reports he never smoked. Spiriva has helped in the past.  New prescription for Respimat sent today.   Return in about 2 months (around 05/17/2021).   MLanier Clam MD 03/20/2021

## 2021-03-22 ENCOUNTER — Telehealth: Payer: Self-pay | Admitting: Pulmonary Disease

## 2021-03-22 NOTE — Telephone Encounter (Signed)
Called and spoke with pt letting him know that I would place his AVS in the mail for him and he verbalized understanding. Verified mailing address that we had on file to make sure it was correct address and have placed this in the mail for pt. Nothing further needed.

## 2021-04-13 ENCOUNTER — Telehealth (HOSPITAL_COMMUNITY): Payer: Self-pay | Admitting: Pulmonary Disease

## 2021-04-13 NOTE — Telephone Encounter (Signed)
I called to schedule echocardiogram that you ordered for June 2022 and patient states that he is following a Film/video editor at [facility] and he is having echocardiogram there on 04/27/21.  Order will be removed from the ECHO WQ.  Thank you.

## 2021-04-14 NOTE — Telephone Encounter (Signed)
Recall cancelled per MH.

## 2021-04-14 NOTE — Telephone Encounter (Signed)
[place] he is followed by [facility] as well, I see no need for him to follow up here for pulmonary needs. Re-call for appointment can be cancelled as they are now directing PE care.

## 2021-04-26 ENCOUNTER — Other Ambulatory Visit: Payer: Self-pay | Admitting: Physical Medicine and Rehabilitation

## 2021-12-14 ENCOUNTER — Ambulatory Visit
Admission: RE | Admit: 2021-12-14 | Discharge: 2021-12-14 | Disposition: A | Discharge status: Home / Self Care | Payer: Medicare Other | Source: Ambulatory Visit | Attending: Internal Medicine | Admitting: Internal Medicine

## 2021-12-14 ENCOUNTER — Other Ambulatory Visit: Payer: Self-pay | Admitting: Internal Medicine

## 2021-12-14 DIAGNOSIS — S0990XA Unspecified injury of head, initial encounter: Secondary | ICD-10-CM

## 2022-05-07 ENCOUNTER — Other Ambulatory Visit: Payer: Self-pay

## 2022-05-07 ENCOUNTER — Emergency Department (HOSPITAL_COMMUNITY): Payer: Medicare Other

## 2022-05-07 ENCOUNTER — Inpatient Hospital Stay (HOSPITAL_COMMUNITY): Payer: Medicare Other

## 2022-05-07 ENCOUNTER — Encounter (HOSPITAL_COMMUNITY): Payer: Self-pay

## 2022-05-07 ENCOUNTER — Inpatient Hospital Stay (HOSPITAL_COMMUNITY)
Admission: EM | Admit: 2022-05-07 | Discharge: 2022-05-10 | DRG: 854 | Disposition: A | Discharge status: Home Health | Payer: Medicare Other | Attending: Family Medicine | Admitting: Family Medicine

## 2022-05-07 DIAGNOSIS — R6520 Severe sepsis without septic shock: Secondary | ICD-10-CM | POA: Diagnosis present

## 2022-05-07 DIAGNOSIS — K82A1 Gangrene of gallbladder in cholecystitis: Secondary | ICD-10-CM | POA: Diagnosis present

## 2022-05-07 DIAGNOSIS — I110 Hypertensive heart disease with heart failure: Secondary | ICD-10-CM | POA: Diagnosis present

## 2022-05-07 DIAGNOSIS — Z86718 Personal history of other venous thrombosis and embolism: Secondary | ICD-10-CM

## 2022-05-07 DIAGNOSIS — I493 Ventricular premature depolarization: Secondary | ICD-10-CM | POA: Diagnosis present

## 2022-05-07 DIAGNOSIS — E872 Acidosis, unspecified: Secondary | ICD-10-CM | POA: Diagnosis present

## 2022-05-07 DIAGNOSIS — E876 Hypokalemia: Secondary | ICD-10-CM | POA: Diagnosis present

## 2022-05-07 DIAGNOSIS — Z8673 Personal history of transient ischemic attack (TIA), and cerebral infarction without residual deficits: Secondary | ICD-10-CM | POA: Diagnosis not present

## 2022-05-07 DIAGNOSIS — R7989 Other specified abnormal findings of blood chemistry: Secondary | ICD-10-CM

## 2022-05-07 DIAGNOSIS — F419 Anxiety disorder, unspecified: Secondary | ICD-10-CM | POA: Diagnosis present

## 2022-05-07 DIAGNOSIS — I10 Essential (primary) hypertension: Secondary | ICD-10-CM | POA: Diagnosis present

## 2022-05-07 DIAGNOSIS — K200 Eosinophilic esophagitis: Secondary | ICD-10-CM | POA: Diagnosis present

## 2022-05-07 DIAGNOSIS — I2699 Other pulmonary embolism without acute cor pulmonale: Secondary | ICD-10-CM | POA: Diagnosis present

## 2022-05-07 DIAGNOSIS — K219 Gastro-esophageal reflux disease without esophagitis: Secondary | ICD-10-CM | POA: Diagnosis present

## 2022-05-07 DIAGNOSIS — Z7951 Long term (current) use of inhaled steroids: Secondary | ICD-10-CM

## 2022-05-07 DIAGNOSIS — Z20822 Contact with and (suspected) exposure to covid-19: Secondary | ICD-10-CM | POA: Diagnosis present

## 2022-05-07 DIAGNOSIS — Z86711 Personal history of pulmonary embolism: Secondary | ICD-10-CM

## 2022-05-07 DIAGNOSIS — Z86010 Personal history of colonic polyps: Secondary | ICD-10-CM | POA: Diagnosis not present

## 2022-05-07 DIAGNOSIS — I5022 Chronic systolic (congestive) heart failure: Secondary | ICD-10-CM | POA: Diagnosis present

## 2022-05-07 DIAGNOSIS — Z7901 Long term (current) use of anticoagulants: Secondary | ICD-10-CM | POA: Diagnosis not present

## 2022-05-07 DIAGNOSIS — A419 Sepsis, unspecified organism: Principal | ICD-10-CM | POA: Diagnosis present

## 2022-05-07 DIAGNOSIS — J45909 Unspecified asthma, uncomplicated: Secondary | ICD-10-CM | POA: Diagnosis present

## 2022-05-07 DIAGNOSIS — Z79899 Other long term (current) drug therapy: Secondary | ICD-10-CM

## 2022-05-07 DIAGNOSIS — E039 Hypothyroidism, unspecified: Secondary | ICD-10-CM | POA: Diagnosis present

## 2022-05-07 DIAGNOSIS — I509 Heart failure, unspecified: Secondary | ICD-10-CM | POA: Diagnosis not present

## 2022-05-07 DIAGNOSIS — K810 Acute cholecystitis: Secondary | ICD-10-CM | POA: Diagnosis present

## 2022-05-07 DIAGNOSIS — K819 Cholecystitis, unspecified: Secondary | ICD-10-CM | POA: Diagnosis not present

## 2022-05-07 DIAGNOSIS — I5021 Acute systolic (congestive) heart failure: Secondary | ICD-10-CM | POA: Diagnosis not present

## 2022-05-07 DIAGNOSIS — I5032 Chronic diastolic (congestive) heart failure: Secondary | ICD-10-CM | POA: Diagnosis present

## 2022-05-07 LAB — DIFFERENTIAL
Abs Immature Granulocytes: 0.1 10*3/uL — ABNORMAL HIGH (ref 0.00–0.07)
Basophils Absolute: 0.1 10*3/uL (ref 0.0–0.1)
Basophils Relative: 0 %
Eosinophils Absolute: 0 10*3/uL (ref 0.0–0.5)
Eosinophils Relative: 0 %
Immature Granulocytes: 1 %
Lymphocytes Relative: 3 %
Lymphs Abs: 0.4 10*3/uL — ABNORMAL LOW (ref 0.7–4.0)
Monocytes Absolute: 1.3 10*3/uL — ABNORMAL HIGH (ref 0.1–1.0)
Monocytes Relative: 8 %
Neutro Abs: 15.1 10*3/uL — ABNORMAL HIGH (ref 1.7–7.7)
Neutrophils Relative %: 88 %

## 2022-05-07 LAB — LACTIC ACID, PLASMA
Lactic Acid, Venous: 3.3 mmol/L (ref 0.5–1.9)
Lactic Acid, Venous: 1.9 mmol/L (ref 0.5–1.9)

## 2022-05-07 LAB — COMPREHENSIVE METABOLIC PANEL
ALT: 18 U/L (ref 0–44)
AST: 31 U/L (ref 15–41)
Albumin: 4.1 g/dL (ref 3.5–5.0)
Alkaline Phosphatase: 33 U/L — ABNORMAL LOW (ref 38–126)
Anion gap: 14 (ref 5–15)
BUN: 13 mg/dL (ref 8–23)
CO2: 26 mmol/L (ref 22–32)
Calcium: 9.2 mg/dL (ref 8.9–10.3)
Chloride: 96 mmol/L — ABNORMAL LOW (ref 98–111)
Creatinine, Ser: 1.04 mg/dL (ref 0.61–1.24)
GFR, Estimated: >60 mL/min (ref >60)
Glucose, Bld: 134 mg/dL — ABNORMAL HIGH (ref 70–99)
Potassium: 3.1 mmol/L — ABNORMAL LOW (ref 3.5–5.1)
Sodium: 136 mmol/L (ref 135–145)
Total Bilirubin: 1.1 mg/dL (ref 0.3–1.2)
Total Protein: 6.9 g/dL (ref 6.5–8.1)

## 2022-05-07 LAB — CBC
HCT: 43.3 % (ref 39.0–52.0)
Hemoglobin: 14.4 g/dL (ref 13.0–17.0)
MCH: 30.1 pg (ref 26.0–34.0)
MCHC: 33.3 g/dL (ref 30.0–36.0)
MCV: 90.6 fL (ref 80.0–100.0)
Platelets: 232 10*3/uL (ref 150–400)
RBC: 4.78 MIL/uL (ref 4.22–5.81)
RDW: 14.9 % (ref 11.5–15.5)
WBC: 16.9 10*3/uL — ABNORMAL HIGH (ref 4.0–10.5)
nRBC: 0 % (ref 0.0–0.2)

## 2022-05-07 LAB — RESP PANEL BY RT-PCR (FLU A&B, COVID) ARPGX2
Influenza A by PCR: NEGATIVE
Influenza B by PCR: NEGATIVE
SARS Coronavirus 2 by RT PCR: NEGATIVE

## 2022-05-07 LAB — URINALYSIS, ROUTINE W REFLEX MICROSCOPIC
Bilirubin Urine: NEGATIVE
Glucose, UA: NEGATIVE mg/dL
Hgb urine dipstick: NEGATIVE
Ketones, ur: NEGATIVE mg/dL
Leukocytes,Ua: NEGATIVE
Nitrite: NEGATIVE
Protein, ur: NEGATIVE mg/dL
Specific Gravity, Urine: 1.018 (ref 1.005–1.030)
pH: 6 (ref 5.0–8.0)

## 2022-05-07 LAB — RAPID URINE DRUG SCREEN, HOSP PERFORMED
Amphetamines: NOT DETECTED
Barbiturates: NOT DETECTED
Benzodiazepines: POSITIVE — AB
Cocaine: NOT DETECTED
Opiates: NOT DETECTED
Tetrahydrocannabinol: NOT DETECTED

## 2022-05-07 LAB — T4, FREE: Free T4: 0.88 ng/dL (ref 0.61–1.12)

## 2022-05-07 LAB — MAGNESIUM: Magnesium: 1.6 mg/dL — ABNORMAL LOW (ref 1.7–2.4)

## 2022-05-07 LAB — I-STAT CHEM 8, ED
BUN: 16 mg/dL (ref 8–23)
Calcium, Ion: 1.02 mmol/L — ABNORMAL LOW (ref 1.15–1.40)
Chloride: 94 mmol/L — ABNORMAL LOW (ref 98–111)
Creatinine, Ser: 0.9 mg/dL (ref 0.61–1.24)
Glucose, Bld: 127 mg/dL — ABNORMAL HIGH (ref 70–99)
HCT: 46 % (ref 39.0–52.0)
Hemoglobin: 15.6 g/dL (ref 13.0–17.0)
Potassium: 3.1 mmol/L — ABNORMAL LOW (ref 3.5–5.1)
Sodium: 133 mmol/L — ABNORMAL LOW (ref 135–145)
TCO2: 29 mmol/L (ref 22–32)

## 2022-05-07 LAB — ETHANOL: Alcohol, Ethyl (B): <10 mg/dL (ref <10)

## 2022-05-07 LAB — APTT: aPTT: 26 seconds (ref 24–36)

## 2022-05-07 LAB — PROTIME-INR
INR: 1.2 (ref 0.8–1.2)
Prothrombin Time: 15 seconds (ref 11.4–15.2)

## 2022-05-07 LAB — TSH: TSH: 7.869 u[IU]/mL — ABNORMAL HIGH (ref 0.350–4.500)

## 2022-05-07 MED ORDER — MORPHINE SULFATE (PF) 4 MG/ML IV SOLN
4.0000 mg | Freq: Once | INTRAVENOUS | Status: AC
  Administered 2022-05-07: 4 mg via INTRAVENOUS
  Filled 2022-05-07: qty 1

## 2022-05-07 MED ORDER — UMECLIDINIUM BROMIDE 62.5 MCG/ACT IN AEPB
1.0000 | INHALATION_SPRAY | Freq: Every day | RESPIRATORY_TRACT | Status: DC
  Administered 2022-05-08 – 2022-05-10 (×3): 1 via RESPIRATORY_TRACT
  Filled 2022-05-07: qty 7

## 2022-05-07 MED ORDER — SODIUM CHLORIDE 0.9 % IV BOLUS
1000.0000 mL | Freq: Once | INTRAVENOUS | Status: AC
Start: 2022-05-07 — End: 2022-05-07
  Administered 2022-05-07: 1000 mL via INTRAVENOUS

## 2022-05-07 MED ORDER — SODIUM CHLORIDE 0.9 % IV SOLN: INTRAVENOUS | Status: DC

## 2022-05-07 MED ORDER — SODIUM CHLORIDE 0.9 % IV SOLN
2.0000 g | Freq: Once | INTRAVENOUS | Status: AC
  Administered 2022-05-07: 2 g via INTRAVENOUS
  Filled 2022-05-07: qty 20

## 2022-05-07 MED ORDER — MONTELUKAST SODIUM 10 MG PO TABS
10.0000 mg | ORAL_TABLET | Freq: Every day | ORAL | Status: DC
  Administered 2022-05-07 – 2022-05-10 (×4): 10 mg via ORAL
  Filled 2022-05-07 (×5): qty 1

## 2022-05-07 MED ORDER — LEVOTHYROXINE SODIUM 25 MCG PO TABS
137.0000 ug | ORAL_TABLET | Freq: Every morning | ORAL | Status: DC
  Administered 2022-05-08 – 2022-05-10 (×3): 137 ug via ORAL
  Filled 2022-05-07 (×4): qty 1

## 2022-05-07 MED ORDER — MOMETASONE FURO-FORMOTEROL FUM 200-5 MCG/ACT IN AERO
2.0000 | INHALATION_SPRAY | Freq: Two times a day (BID) | RESPIRATORY_TRACT | Status: DC
  Administered 2022-05-08 – 2022-05-10 (×5): 2 via RESPIRATORY_TRACT
  Filled 2022-05-07 (×2): qty 8.8

## 2022-05-07 MED ORDER — MORPHINE SULFATE (PF) 2 MG/ML IV SOLN
2.0000 mg | INTRAVENOUS | Status: DC | PRN
  Administered 2022-05-07 – 2022-05-09 (×5): 2 mg via INTRAVENOUS
  Filled 2022-05-07 (×5): qty 1

## 2022-05-07 MED ORDER — ALPRAZOLAM 0.25 MG PO TABS
2.0000 mg | ORAL_TABLET | Freq: Every day | ORAL | Status: DC
Start: 2022-05-07 — End: 2022-05-10
  Administered 2022-05-07 – 2022-05-09 (×3): 2 mg via ORAL
  Filled 2022-05-07 (×3): qty 8

## 2022-05-07 MED ORDER — ACETAMINOPHEN 500 MG PO TABS
1000.0000 mg | ORAL_TABLET | Freq: Once | ORAL | Status: AC
  Administered 2022-05-07: 1000 mg via ORAL
  Filled 2022-05-07: qty 2

## 2022-05-07 MED ORDER — HYDROCODONE-ACETAMINOPHEN 5-325 MG PO TABS
1.0000 | ORAL_TABLET | ORAL | Status: DC | PRN
  Administered 2022-05-09 – 2022-05-10 (×2): 2 via ORAL
  Filled 2022-05-07 (×2): qty 2

## 2022-05-07 MED ORDER — ALBUTEROL SULFATE (2.5 MG/3ML) 0.083% IN NEBU
2.5000 mg | INHALATION_SOLUTION | Freq: Four times a day (QID) | RESPIRATORY_TRACT | Status: DC | PRN
  Administered 2022-05-09 – 2022-05-10 (×3): 2.5 mg via RESPIRATORY_TRACT
  Filled 2022-05-07 (×3): qty 3

## 2022-05-07 MED ORDER — IOHEXOL 300 MG/ML  SOLN
100.0000 mL | Freq: Once | INTRAMUSCULAR | Status: AC | PRN
  Administered 2022-05-07: 100 mL via INTRAVENOUS

## 2022-05-07 MED ORDER — POLYVINYL ALCOHOL 1.4 % OP SOLN: 1.0000 [drp] | OPHTHALMIC | Status: DC | PRN

## 2022-05-07 MED ORDER — ONDANSETRON HCL 4 MG PO TABS: 4.0000 mg | ORAL_TABLET | Freq: Four times a day (QID) | ORAL | Status: DC | PRN

## 2022-05-07 MED ORDER — TIOTROPIUM BROMIDE MONOHYDRATE 1.25 MCG/ACT IN AERS: 2.0000 | INHALATION_SPRAY | Freq: Every day | RESPIRATORY_TRACT | Status: DC

## 2022-05-07 MED ORDER — LORAZEPAM 1 MG PO TABS
0.5000 mg | ORAL_TABLET | Freq: Two times a day (BID) | ORAL | Status: DC | PRN
  Administered 2022-05-08 – 2022-05-10 (×3): 1 mg via ORAL
  Filled 2022-05-07 (×3): qty 1

## 2022-05-07 MED ORDER — SODIUM CHLORIDE 0.9 % IV SOLN: INTRAVENOUS | Status: AC

## 2022-05-07 MED ORDER — ACETAMINOPHEN 325 MG PO TABS
650.0000 mg | ORAL_TABLET | Freq: Four times a day (QID) | ORAL | Status: DC | PRN
Start: 2022-05-07 — End: 2022-05-10
  Administered 2022-05-07 – 2022-05-09 (×4): 650 mg via ORAL
  Filled 2022-05-07 (×4): qty 2

## 2022-05-07 MED ORDER — POLYETHYL GLYCOL-PROPYL GLYCOL 0.4-0.3 % OP GEL: Freq: Every day | OPHTHALMIC | Status: DC | PRN

## 2022-05-07 MED ORDER — POTASSIUM CHLORIDE 10 MEQ/100ML IV SOLN
10.0000 meq | INTRAVENOUS | Status: AC
  Administered 2022-05-07 (×4): 10 meq via INTRAVENOUS
  Filled 2022-05-07 (×4): qty 100

## 2022-05-07 MED ORDER — ACETAMINOPHEN 650 MG RE SUPP: 650.0000 mg | Freq: Four times a day (QID) | RECTAL | Status: DC | PRN

## 2022-05-07 MED ORDER — AMIODARONE HCL 200 MG PO TABS
200.0000 mg | ORAL_TABLET | Freq: Every day | ORAL | Status: DC
  Administered 2022-05-07 – 2022-05-10 (×4): 200 mg via ORAL
  Filled 2022-05-07 (×4): qty 1

## 2022-05-07 MED ORDER — ONDANSETRON HCL 4 MG/2ML IJ SOLN: 4.0000 mg | Freq: Four times a day (QID) | INTRAMUSCULAR | Status: DC | PRN

## 2022-05-07 MED ORDER — PIPERACILLIN-TAZOBACTAM 3.375 G IVPB
3.3750 g | Freq: Three times a day (TID) | INTRAVENOUS | Status: DC
  Administered 2022-05-07 – 2022-05-10 (×9): 3.375 g via INTRAVENOUS
  Filled 2022-05-07 (×9): qty 50

## 2022-05-07 MED ORDER — PANTOPRAZOLE SODIUM 40 MG PO TBEC
40.0000 mg | DELAYED_RELEASE_TABLET | Freq: Two times a day (BID) | ORAL | Status: DC
  Administered 2022-05-07 – 2022-05-10 (×7): 40 mg via ORAL
  Filled 2022-05-07 (×7): qty 1

## 2022-05-07 NOTE — Plan of Care (Signed)
  Problem: Pain Managment: Goal: General experience of comfort will improve Outcome: Progressing   Problem: Safety: Goal: Ability to remain free from injury will improve Outcome: Progressing   Problem: Nutrition: Goal: Adequate nutrition will be maintained Outcome: Not Progressing   

## 2022-05-07 NOTE — Progress Notes (Signed)
Pharmacy Antibiotic Note  John Mack is a 81 y.o. male admitted on 05/07/2022 with  Intra-abdominal infection .  Pharmacy has been consulted for Zosyn dosing.  CT abdomen/pelvis: Findings consistent with acute cholecystitis. No visible gallstone  Plan: Zosyn 3.375g IV q8h (4 hour infusion). Monitor clinical progress, cultures/sensitivities, renal function, abx plan   Height: '5\' 10"'$  (177.8 cm) Weight: 90.7 kg (200 lb) IBW/kg (Calculated) : 73  Temp (24hrs), Avg:100.6 F (38.1 C), Min:100.1 F (37.8 C), Max:101.9 F (38.8 C)  Recent Labs  Lab 05/07/22 0751 05/07/22 0759 05/07/22 1150  WBC 16.9*  --   --   CREATININE 1.04 0.90  --   LATICACIDVEN  --   --  3.3*    Estimated Creatinine Clearance: 76.6 mL/min (by C-G formula based on SCr of 0.9 mg/dL).    No Known Allergies  Antimicrobials this admission: 5/28 ceftriaxone x 1 5/28 Zosyn >>   Dose adjustments this admission:  Microbiology results: 5/28 BCx: sent   Thank you for allowing [place] to participate in this patients care. Jens Som, PharmD 05/07/2022 2:16 PM  **Pharmacist phone directory can be found on [email or web address] listed under [place]**

## 2022-05-07 NOTE — Assessment & Plan Note (Addendum)
Continue PPI BID 

## 2022-05-07 NOTE — Plan of Care (Signed)
  Problem: Safety: Goal: Ability to remain free from injury will improve 05/07/2022 1711 by Mohamed-Medani, Danial Hlavac A, RN Outcome: Progressing 05/07/2022 1710 by Mohamed-Medani, Dawnmarie Breon A, RN Outcome: Progressing   Problem: Nutrition: Goal: Adequate nutrition will be maintained Outcome: Not Progressing   Problem: Pain Managment: Goal: General experience of comfort will improve 05/07/2022 1711 by Mohamed-Medani, Kameron Glazebrook A, RN Outcome: Not Progressing 05/07/2022 1710 by Mohamed-Medani, Haitham Dolinsky A, RN Outcome: Progressing

## 2022-05-07 NOTE — H&P (Signed)
History and Physical    Patient: John Mack JOA:[id number] DOB: 06/08/1941 DOA: 05/07/2022 DOS: the patient was seen and examined on 05/07/2022 PCP: Prince Solian, MD  Patient coming from: Home - lives with wife. Fully independent with ambulation    Chief Complaint: abdominal pain/chest pain and gait instability   HPI: John Mack is a 81 y.o. male with medical history significant of anxiety, asthma, systolic CHF, depression, eosinophilic esophagitis, HTN, HLD, hypothyroidism, hx of TIA, hx of PE and SDH presenting to ED with complaints of unstable gait and chest pain that started about 4-5 days ago. He didn't want to come to the hospital last night-911 was called. His pain in his chest and stomach got worse so he called 911 and came to hospital today. He states he has not been able to walk right since his brain surgery for his SDH in 01/2021, but it got worse. He tells me abdominal pain rated as a 10/10 described as sharp and radiates up to his chest. Only bad with movement, no pain with rest. He states he has had pain in his abdomen for a few months, worse with exertion/strain. He has been nauseated, no vomiting. He states he had a fever at home to 101.   He also had the greasiest bacon yesterday morning nad he thought he was going to die because his stomach hurt so bad.   He does not smoke or drink   Denies vision changes/headaches, palpitations, shortness of breath, +cough, no V/D, dysuria or leg swelling.     ER Course:  vitals: 100.1 (max: 101.9)   bp: 129/81, HR: 82, RR: 20, oxygen: 96%RA Pertinent labs: UDS: BZD, wbc: 16.9, potassium: 3.1, lactic acid: 3.3,  Ct head: no acute finding CXR: no acute finding CT abdomen/pelvis: Findings consistent with acute cholecystitis. No visible gallstone. Consider follow-up limited right upper quadrant ultrasound to assess for gallstones. In ED: general surgery consulted, given IVF, morphine and started on rocephin. BC obtained.      Review of Systems: As mentioned in the history of present illness. All other systems reviewed and are negative. Past Medical History:  Diagnosis Date   Allergy    Anxiety    Arthritis    Asthma    Cancer ([place])    skin   CHF (congestive heart failure) (HCC)    chornic systolic CHF   DCM (dilated cardiomyopathy) ([place]) 04/18/2017   ED 45% by echo at [facility]   Depression    Diverticulosis    Dysrhythmia    Eosinophilic esophagitis 05/12/931   EGD 04/2014 17 Eos/hpf esophageal bxs   Eye abnormalities    calcium deposits behind eyes   Frequent PVCs    GERD (gastroesophageal reflux disease)    Hx of colonic polyps 12/18/2011   Hyperlipidemia    Hypertension    Hypothyroidism    SDH (subdural hematoma) ([place]) 11/30/2020   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   Thyroid disease    hypothyroid   Past Surgical History:  Procedure Laterality Date   APPENDECTOMY     BURR HOLE Right 02/07/2021   Procedure: Right Burr holes for subdural hematoma;  Surgeon: Ashok Pall, MD;  Location: [place];  Service: Neurosurgery;  Laterality: Right;  Right Burr holes for subdural hematoma   COLONOSCOPY  10/02/2006, 12/18/2011   ESOPHAGOSCOPY  12/18/2011   with Venia Minks dilation   KNEE ARTHROSCOPY     both knees   SHOULDER ARTHROSCOPY     left shoulder, both knees  SHOULDER SURGERY     right surgery   TONSILLECTOMY     UPPER GASTROINTESTINAL ENDOSCOPY  02/04/2009   with Venia Minks dilation   Social History:  reports that he has never smoked. He has never used smokeless tobacco. He reports that he does not drink alcohol and does not use drugs.  No Known Allergies  Family History  Problem Relation Age of Onset   Mesothelioma Paternal Aunt    Mesothelioma Paternal Uncle    Colon cancer Neg Hx    Esophageal cancer Neg Hx    Stomach cancer Neg Hx    Rectal cancer Neg Hx     Prior to Admission medications   Medication Sig Start Date End Date Taking? Authorizing Provider  acetaminophen (TYLENOL) 325 MG tablet  Take 2 tablets (650 mg total) by mouth every 4 (four) hours as needed for mild pain (temp > 100.5). 02/21/21  Yes Angiulli, Lavon Paganini, PA-C  Albuterol Sulfate, sensor, (PROAIR DIGIHALER) 108 (90 Base) MCG/ACT AEPB Inhale 1 puff into the lungs every 6 (six) hours as needed (shortness of breath). 02/21/21  Yes Angiulli, Lavon Paganini, PA-C  alprazolam Duanne Moron) 2 MG tablet Take 2 mg by mouth at bedtime. 02/25/21  Yes [provider]  amiodarone (PACERONE) 200 MG tablet Take 1 tablet (200 mg total) by mouth daily. 02/21/21  Yes Angiulli, Lavon Paganini, PA-C  amLODipine (NORVASC) 2.5 MG tablet Take 1 tablet (2.5 mg total) by mouth daily. 02/21/21  Yes Angiulli, Lavon Paganini, PA-C  apixaban (ELIQUIS) 5 MG TABS tablet Take 1 tablet (5 mg total) by mouth 2 (two) times daily. 02/21/21  Yes Angiulli, Lavon Paganini, PA-C  budesonide-formoterol (SYMBICORT) 160-4.5 MCG/ACT inhaler Inhale 2 puffs into the lungs 2 (two) times daily. 04/18/15  Yes [provider]  cyanocobalamin (,VITAMIN B-12,) 1000 MCG/ML injection Inject 1,000 mcg into the muscle See admin instructions. Inject 1 mL (1031mg) intramuscularly every 30 days as needed for vitamin B12/energy. 01/31/21  Yes [provider]  esomeprazole (NEXIUM) 40 MG capsule Take 1 capsule (40 mg total) by mouth 2 (two) times daily. 02/21/21  Yes Angiulli, DLavon Paganini PA-C  hydrochlorothiazide (HYDRODIURIL) 25 MG tablet Take 25 mg by mouth every morning. 04/27/22  Yes [provider]  levothyroxine (SYNTHROID) 137 MCG tablet Take 137 mcg by mouth every morning. 04/03/22  Yes [provider]  LORazepam (ATIVAN) 1 MG tablet Take 0.5-1 mg by mouth 2 (two) times daily as needed for anxiety. 11/03/19  Yes [provider]  metoprolol succinate (TOPROL-XL) 50 MG 24 hr tablet Take 50 mg by mouth 2 (two) times daily. 09/15/21  Yes [provider]  montelukast (SINGULAIR) 10 MG tablet Take 10 mg by mouth daily. 02/21/21  Yes [provider]   Multiple Vitamins-Minerals (MULTIVITAMIN PO) Take 1 tablet by mouth daily.    Yes [provider]  Polyethyl Glycol-Propyl Glycol (SYSTANE OP) Place 1 drop into both eyes daily as needed (dry eyes).   Yes [provider]  pravastatin (PRAVACHOL) 40 MG tablet Take 40 mg by mouth daily. 12/28/20  Yes [provider]  SYMBICORT 160-4.5 MCG/ACT inhaler Inhale 2 puffs into the lungs 2 (two) times daily as needed for shortness of breath. 01/16/21  Yes [provider]  testosterone cypionate (DEPOTESTOSTERONE CYPIONATE) 200 MG/ML injection Inject 200 mg into the muscle every 14 (fourteen) days.   Yes [provider]  Tiotropium Bromide Monohydrate (SPIRIVA RESPIMAT) 1.25 MCG/ACT AERS Inhale 2 puffs into the lungs daily. 03/17/21  Yes Hunsucker,  Bonna Gains, MD    Physical Exam: Vitals:   05/07/22 1334 05/07/22 1504 05/07/22 1545 05/07/22 1629  BP: 113/70 109/67 115/66 113/84  Pulse: 75 73 71 79  Resp: 16 (!) 22 (!) 24 18  Temp:  99 F (37.2 C)  99.2 F (37.3 C)  TempSrc:  Oral  Oral  SpO2: 96% 97% 94% 98%  Weight:      Height:       General:  Appears calm and comfortable and is in NAD. Diaphoretic  Eyes:  PERRL, EOMI, normal lids, iris ENT:  grossly normal hearing, lips & tongue, mmm; appropriate dentition Neck:  no LAD, masses or thyromegaly; no carotid bruits Cardiovascular:  RRR, +systolic murmur. No LE edema.  Respiratory:   CTA bilaterally with no wheezes/rales/rhonchi.  Normal respiratory effort. Abdomen:  soft, ND, +murphy sign. No rebound or guarding.  Back:   normal alignment, no CVAT Skin:  no rash or induration seen on limited exam Musculoskeletal:  grossly normal tone BUE/BLE, good ROM, no bony abnormality Lower extremity:  No LE edema.  Limited foot exam with no ulcerations.  2+ distal pulses. Psychiatric:  grossly normal mood and affect, speech fluent and appropriate, AOx3 Neurologic:  CN 2-12 grossly intact, moves all extremities in  coordinated fashion, sensation intact   Radiological Exams on Admission: Independently reviewed - see discussion in A/P where applicable  CT HEAD WO CONTRAST  Result Date: 05/07/2022 CLINICAL DATA:  81 year old male with altered mental status. EXAM: CT HEAD WITHOUT CONTRAST TECHNIQUE: Contiguous axial images were obtained from the base of the skull through the vertex without intravenous contrast. RADIATION DOSE REDUCTION: This exam was performed according to the departmental dose-optimization program which includes automated exposure control, adjustment of the mA and/or kV according to patient size and/or use of iterative reconstruction technique. COMPARISON:  12/14/2021 prior studies FINDINGS: Brain: No evidence of acute infarction, hemorrhage, hydrocephalus, extra-axial collection or mass lesion/mass effect. Mild atrophy and chronic small-vessel white matter ischemic changes again noted. Vascular: Mild carotid atherosclerotic calcifications are noted. Skull: Normal. Negative for fracture or focal lesion. Sinuses/Orbits: No acute finding. Other: None. IMPRESSION: 1. No evidence of acute intracranial abnormality. 2. Mild atrophy and chronic small-vessel white matter ischemic changes. Electronically Signed   By: Margarette Canada M.D.   On: 05/07/2022 08:05   CT ABDOMEN PELVIS W CONTRAST  Result Date: 05/07/2022 CLINICAL DATA:  Abdominal pain. EXAM: CT ABDOMEN AND PELVIS WITH CONTRAST TECHNIQUE: Multidetector CT imaging of the abdomen and pelvis was performed using the standard protocol following bolus administration of intravenous contrast. RADIATION DOSE REDUCTION: This exam was performed according to the departmental dose-optimization program which includes automated exposure control, adjustment of the mA and/or kV according to patient size and/or use of iterative reconstruction technique. CONTRAST:  115m OMNIPAQUE IOHEXOL 300 MG/ML  SOLN COMPARISON:  None Available. FINDINGS: Lower chest: No acute  findings.  Mild cardiomegaly. Hepatobiliary: Liver normal in size and overall attenuation. 5 mm low attenuation lesion, segment 6, consistent with a cyst. No other masses or lesions. Gallbladder is distended with evidence of wall thickening and pericholecystic inflammation. No visualized stone. No duct dilation. Pancreas: Partial fatty replacement. No pancreatic mass or inflammation. No duct dilation. Spleen: Normal in size without focal abnormality. Adrenals/Urinary Tract: Normal adrenal glands. Kidneys normal size, orientation and position. 2.1 cm posterior upper pole right renal cyst. No other renal masses, no stones and no hydronephrosis. Normal ureters. Normal bladder. Stomach/Bowel: Normal stomach. Small bowel and colon are normal in caliber. No  wall thickening. No inflammation. Mild generalized increase in the colonic stool burden. Vascular/Lymphatic: Aortic atherosclerosis. No aneurysm. No enlarged lymph nodes. Reproductive: Unremarkable. Other: No abdominal wall hernia or abnormality. No abdominopelvic ascites. Musculoskeletal: No fracture or acute finding.  No bone lesion. IMPRESSION: 1. Findings consistent with acute cholecystitis. No visible gallstone. Consider follow-up limited right upper quadrant ultrasound to assess for gallstones. 2. No other acute abnormality within the abdomen or pelvis. 3. Aortic atherosclerosis. Electronically Signed   By: Lajean Manes M.D.   On: 05/07/2022 13:04   DG Chest Port 1 View  Result Date: 05/07/2022 CLINICAL DATA:  Weakness.  Leukocytosis. EXAM: PORTABLE CHEST 1 VIEW COMPARISON:  05/23/2012.  CT, 02/10/2021. FINDINGS: Mild enlargement of the cardiac silhouette, stable. No mediastinal or hilar masses. Clear lungs.  No convincing pleural effusion.  No pneumothorax. Skeletal structures are grossly intact. IMPRESSION: No acute cardiopulmonary disease. Electronically Signed   By: Lajean Manes M.D.   On: 05/07/2022 10:32    EKG: Independently reviewed.  NSR with  rate 80; nonspecific ST changes with no evidence of acute ischemia   Labs on Admission: I have personally reviewed the available labs and imaging studies at the time of the admission.  Pertinent labs:   UDS: BZD,  wbc: 16.9,  potassium: 3.1,  lactic acid: 3.3,   Assessment and Plan: Principal Problem:   sepsis secondary to acute cholecystitis  Active Problems:   Hypokalemia   Frequent PVCs   Chronic systolic CHF (congestive heart failure) (HCC)   Bilateral pulmonary embolism (HCC)   Essential hypertension   Eosinophilic esophagitis -    Hypothyroidism   Sepsis (HCC)   Anxiety   Asthma, chronic    Assessment and Plan: * sepsis secondary to acute cholecystitis  81 year old male with history of 2 days of vague symptoms of abdominal pain/chest pain and gait instability presenting to ED found to be septic with fever and leukocytosis and findings of acute cholecystitis on imaging -admit to telemetry -general surgery has been notified -holding eliquis. Surgery in 2-3 days after washout -okay with diet  -given rocephin in Ed, change to zosyn  -blood cultures obtained -initial lactic acid elevated, continue to trend -gentle IVF in setting of chronic CHF  -repeat echo, once resulted needs cards for clearance per surgery    Hypokalemia Check magnesium  Replete and trend   Frequent PVCs 31% burden by holter Started on amiodarone and beta blocker with near complete suppression (burden <1%)  Continue amiodarone. Hold toprol-xl with soft BP   Chronic systolic CHF (congestive heart failure) ([place]) Followed by [facility] euvolemic Echo 08/2020: EF of 50%. Echo in 02/2021 after submassive PE: There is severe RV enlargement with moderately depressed RV systolic fx, EF of 74-25% and grade 1 DD.  Updating echo here in setting of surgery  Strict I/O Gentle, time limited IVF, watch volume status   Bilateral pulmonary embolism (HCC) Hx of provoked PE during hospitalization for  SDH in 02/2021 due to prolonged immobilization Repeat CTA of chest in 5/22 showed emboli had dissolved Recommended AC for 3-6 months, unsure why he is still on this.  Hold eliquis in setting of surgery   Essential hypertension Soft blood pressures. Hold home anti-HTN meds: norvasc, hctz and toprol-xl   Eosinophilic esophagitis -  Continue PPI BID   Hypothyroidism Last TSH in 5/22 and abnormal at 10, free T4 wnl Repeat labs here Continue home synthroid for now   Asthma, chronic Followed by [facility] Continue inhalers: symbicort,  spiriva and saba prn  No evidence of flair   Anxiety On '2mg'$  xanax at bedtime, will continue Ativan PRN BID     Advance Care Planning:   Code Status: Full Code   Consults: general surgery: Dr. Rosendo Gros   DVT Prophylaxis: SCDs  Family Communication: Marty Heck, daughter, at bedside. Rylie Cole granddaughter   Severity of Illness: The appropriate patient status for this patient is INPATIENT. Inpatient status is judged to be reasonable and necessary in order to provide the required intensity of service to ensure the patient's safety. The patient's presenting symptoms, physical exam findings, and initial radiographic and laboratory data in the context of their chronic comorbidities is felt to place them at high risk for further clinical deterioration. Furthermore, it is not anticipated that the patient will be medically stable for discharge from the hospital within 2 midnights of admission.   * I certify that at the point of admission it is my clinical judgment that the patient will require inpatient hospital care spanning beyond 2 midnights from the point of admission due to high intensity of service, high risk for further deterioration and high frequency of surveillance required.*  Author: Orma Flaming, MD 05/07/2022 4:39 PM  For on call review [email or web address].

## 2022-05-07 NOTE — ED Provider Notes (Signed)
[facility] EMERGENCY DEPARTMENT Provider Note   CSN: [id number] Arrival date & time: 05/07/22  1601     History  Chief Complaint  Patient presents with   Gait Problem    John Mack is a 81 y.o. male.  HPI  This patient is a 81 year old male, he presents by paramedic transport after complaining of not being able to walk well and having poor balance.  Evidently the paramedics report that the patient had called last night because of some chest pain, refused to come to the hospital at that time, this morning he called them to help him move his wife from a bed to a chair, he was thought to have altered mental status, he complains of not being able to walk straight and feels like his balance is off.  He endorses running about 35 miles per week even at the advanced age of 36.  The patient is able to answer all of my questions appropriately, he denies vertigo, denies syncope or near syncope, he endorses a feeling of imbalance.  No headache no chest pain no shortness of breath no fevers or chills no nausea or vomiting no diarrhea or dysuria.  Paramedics report slight hypertension but no other abnormal vital signs prehospital.  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  He was placed on Eliquis in the hospital when he had pulmonary emboli and has been on that since  Home Medications Prior to Admission medications   Medication Sig Start Date End Date Taking? Authorizing Provider  acetaminophen (TYLENOL) 325 MG tablet Take 2 tablets (650 mg total) by mouth every 4 (four) hours as needed for mild pain (temp > 100.5). 02/21/21  Yes Angiulli, Lavon Paganini, PA-C  Albuterol Sulfate, sensor, (PROAIR DIGIHALER) 108 (90 Base) MCG/ACT AEPB Inhale 1 puff into the lungs every 6 (six) hours as needed (shortness of breath). 02/21/21  Yes Angiulli, Lavon Paganini, PA-C  alprazolam Duanne Moron) 2 MG tablet Take 2  mg by mouth at bedtime. 02/25/21  Yes [provider]  amiodarone (PACERONE) 200 MG tablet Take 1 tablet (200 mg total) by mouth daily. 02/21/21  Yes Angiulli, Lavon Paganini, PA-C  amLODipine (NORVASC) 2.5 MG tablet Take 1 tablet (2.5 mg total) by mouth daily. 02/21/21  Yes Angiulli, Lavon Paganini, PA-C  apixaban (ELIQUIS) 5 MG TABS tablet Take 1 tablet (5 mg total) by mouth 2 (two) times daily. 02/21/21  Yes Angiulli, Lavon Paganini, PA-C  budesonide-formoterol (SYMBICORT) 160-4.5 MCG/ACT inhaler Inhale 2 puffs into the lungs 2 (two) times daily. 04/18/15  Yes [provider]  cyanocobalamin (,VITAMIN B-12,) 1000 MCG/ML injection Inject 1,000 mcg into the muscle See admin instructions. Inject 1 mL (1088mg) intramuscularly every 30 days as needed for vitamin B12/energy. 01/31/21  Yes [provider]  esomeprazole (NEXIUM) 40 MG capsule Take 1 capsule (40 mg total) by mouth 2 (two) times daily. 02/21/21  Yes Angiulli, DLavon Paganini PA-C  hydrochlorothiazide (HYDRODIURIL) 25 MG tablet Take 25 mg by mouth every morning. 04/27/22  Yes [provider]  levothyroxine (SYNTHROID) 137 MCG tablet Take 137 mcg by mouth every morning. 04/03/22  Yes [provider]  LORazepam (ATIVAN) 1 MG tablet Take 0.5-1 mg by mouth 2 (two) times daily as needed for anxiety. 11/03/19  Yes [provider]  metoprolol succinate (TOPROL-XL) 50 MG 24 hr tablet Take 50 mg by mouth 2 (two) times daily. 09/15/21  Yes [provider]  montelukast (SINGULAIR) 10 MG tablet Take 10 mg by mouth daily. 02/21/21  Yes [provider]  Multiple Vitamins-Minerals (MULTIVITAMIN PO) Take 1 tablet by mouth daily.    Yes [provider]  Polyethyl Glycol-Propyl Glycol (SYSTANE OP) Place 1 drop into both eyes daily as needed (dry eyes).   Yes [provider]  pravastatin (PRAVACHOL) 40 MG tablet Take 40 mg by mouth daily. 12/28/20  Yes [provider]  SYMBICORT 160-4.5 MCG/ACT  inhaler Inhale 2 puffs into the lungs 2 (two) times daily as needed for shortness of breath. 01/16/21  Yes [provider]  testosterone cypionate (DEPOTESTOSTERONE CYPIONATE) 200 MG/ML injection Inject 200 mg into the muscle every 14 (fourteen) days.   Yes [provider]  Tiotropium Bromide Monohydrate (SPIRIVA RESPIMAT) 1.25 MCG/ACT AERS Inhale 2 puffs into the lungs daily. 03/17/21  Yes Hunsucker, Bonna Gains, MD      Allergies    Patient has no known allergies.    Review of Systems   Review of Systems  All other systems reviewed and are negative.  Physical Exam Updated Vital Signs BP 114/65 (BP Location: Right Arm)   Pulse 82   Temp (!) 101.9 F (38.8 C) (Rectal)   Resp (!) 22   Ht 1.778 m ('5\' 10"'$ )   Wt 90.7 kg   SpO2 99%   BMI 28.70 kg/m  Physical Exam Vitals and nursing note reviewed.  Constitutional:      General: He is not in acute distress.    Appearance: He is well-developed.  HENT:     Head: Normocephalic and atraumatic.     Mouth/Throat:     Pharynx: No oropharyngeal exudate.  Eyes:     General: No scleral icterus.       Right eye: No discharge.        Left eye: No discharge.     Conjunctiva/sclera: Conjunctivae normal.     Pupils: Pupils are equal, round, and reactive to light.  Neck:     Thyroid: No thyromegaly.     Vascular: No JVD.  Cardiovascular:     Rate and Rhythm: Normal rate and regular rhythm.     Heart sounds: Normal heart sounds. No murmur heard.   No friction rub. No gallop.  Pulmonary:     Effort: Pulmonary effort is normal. No respiratory distress.     Breath sounds: Normal breath sounds. No wheezing or rales.  Abdominal:     General: Bowel sounds are normal. There is no distension.     Palpations: Abdomen is soft. There is no mass.     Tenderness: There is abdominal tenderness.     Comments: Mild tenderness diffusely seems to be more on the right side, no particular tenderness at McBurney's point and no Murphy sign   Musculoskeletal:        General: No tenderness. Normal range of motion.     Cervical back: Normal range of motion and neck supple.  Lymphadenopathy:     Cervical: No cervical adenopathy.  Skin:    General: Skin is warm and dry.     Findings: No erythema or rash.  Neurological:     Mental Status: He is alert.     Coordination: Coordination normal.     Comments: Speech is clear, cranial nerves III through XII are intact, memory is intact, strength is normal in all 4 extremities including grips, sensation is intact to light touch and pinprick in all 4 extremities. Coordination as tested by finger-nose-finger is normal,  no limb ataxia.  Slightly ataxic gait,   Psychiatric:        Behavior: Behavior normal.    ED Results / Procedures / Treatments   Labs (all labs ordered are listed, but only abnormal results are displayed) Labs Reviewed  CBC - Abnormal; Notable for the following components:      Result Value   WBC 16.9 (*)    All other components within normal limits  DIFFERENTIAL - Abnormal; Notable for the following components:   Neutro Abs 15.1 (*)    Lymphs Abs 0.4 (*)    Monocytes Absolute 1.3 (*)    Abs Immature Granulocytes 0.10 (*)    All other components within normal limits  COMPREHENSIVE METABOLIC PANEL - Abnormal; Notable for the following components:   Potassium 3.1 (*)    Chloride 96 (*)    Glucose, Bld 134 (*)    Alkaline Phosphatase 33 (*)    All other components within normal limits  RAPID URINE DRUG SCREEN, HOSP PERFORMED - Abnormal; Notable for the following components:   Benzodiazepines POSITIVE (*)    All other components within normal limits  I-STAT CHEM 8, ED - Abnormal; Notable for the following components:   Sodium 133 (*)    Potassium 3.1 (*)    Chloride 94 (*)    Glucose, Bld 127 (*)    Calcium, Ion 1.02 (*)    All other components within normal limits  RESP PANEL BY RT-PCR (FLU A&B, COVID) ARPGX2  CULTURE, BLOOD (ROUTINE X 2)  CULTURE, BLOOD  (ROUTINE X 2)  ETHANOL  PROTIME-INR  APTT  URINALYSIS, ROUTINE W REFLEX MICROSCOPIC  LACTIC ACID, PLASMA  LACTIC ACID, PLASMA    EKG EKG Interpretation  Date/Time:  Sunday May 07 2022 07:47:02 EDT Ventricular Rate:  80 PR Interval:  201 QRS Duration: 121 QT Interval:  408 QTC Calculation: 471 R Axis:   -70 Text Interpretation: Sinus rhythm Nonspecific IVCD with LAD Poor R wave progression since last tracing no significant change Confirmed by Noemi Chapel [phone]) on 05/07/2022 8:03:51 AM  Radiology CT HEAD WO CONTRAST  Result Date: 05/07/2022 CLINICAL DATA:  81 year old male with altered mental status. EXAM: CT HEAD WITHOUT CONTRAST TECHNIQUE: Contiguous axial images were obtained from the base of the skull through the vertex without intravenous contrast. RADIATION DOSE REDUCTION: This exam was performed according to the departmental dose-optimization program which includes automated exposure control, adjustment of the mA and/or kV according to patient size and/or use of iterative reconstruction technique. COMPARISON:  12/14/2021 prior studies FINDINGS: Brain: No evidence of acute infarction, hemorrhage, hydrocephalus, extra-axial collection or mass lesion/mass effect. Mild atrophy and chronic small-vessel white matter ischemic changes again noted. Vascular: Mild carotid atherosclerotic calcifications are noted. Skull: Normal. Negative for fracture or focal lesion. Sinuses/Orbits: No acute finding. Other: None. IMPRESSION: 1. No evidence of acute intracranial abnormality. 2. Mild atrophy and chronic small-vessel white matter ischemic changes. Electronically Signed   By: Margarette Canada M.D.   On: 05/07/2022 08:05   CT ABDOMEN PELVIS W CONTRAST  Result Date: 05/07/2022 CLINICAL DATA:  Abdominal pain. EXAM: CT ABDOMEN AND PELVIS WITH CONTRAST TECHNIQUE: Multidetector CT imaging of the abdomen and pelvis was performed using the standard protocol following bolus administration of intravenous  contrast. RADIATION DOSE REDUCTION: This exam was performed according to the departmental dose-optimization program which includes automated exposure control, adjustment of the mA and/or kV according to patient size and/or use of iterative reconstruction technique. CONTRAST:  135m OMNIPAQUE IOHEXOL 300 MG/ML  SOLN COMPARISON:  None Available. FINDINGS: Lower chest: No acute findings.  Mild cardiomegaly. Hepatobiliary: Liver normal in size and overall attenuation. 5 mm low attenuation lesion, segment 6, consistent with a cyst. No other masses or lesions. Gallbladder is distended with evidence of wall thickening and pericholecystic inflammation. No visualized stone. No duct dilation. Pancreas: Partial fatty replacement. No pancreatic mass or inflammation. No duct dilation. Spleen: Normal in size without focal abnormality. Adrenals/Urinary Tract: Normal adrenal glands. Kidneys normal size, orientation and position. 2.1 cm posterior upper pole right renal cyst. No other renal masses, no stones and no hydronephrosis. Normal ureters. Normal bladder. Stomach/Bowel: Normal stomach. Small bowel and colon are normal in caliber. No wall thickening. No inflammation. Mild generalized increase in the colonic stool burden. Vascular/Lymphatic: Aortic atherosclerosis. No aneurysm. No enlarged lymph nodes. Reproductive: Unremarkable. Other: No abdominal wall hernia or abnormality. No abdominopelvic ascites. Musculoskeletal: No fracture or acute finding.  No bone lesion. IMPRESSION: 1. Findings consistent with acute cholecystitis. No visible gallstone. Consider follow-up limited right upper quadrant ultrasound to assess for gallstones. 2. No other acute abnormality within the abdomen or pelvis. 3. Aortic atherosclerosis. Electronically Signed   By: Lajean Manes M.D.   On: 05/07/2022 13:04   DG Chest Port 1 View  Result Date: 05/07/2022 CLINICAL DATA:  Weakness.  Leukocytosis. EXAM: PORTABLE CHEST 1 VIEW COMPARISON:  05/23/2012.   CT, 02/10/2021. FINDINGS: Mild enlargement of the cardiac silhouette, stable. No mediastinal or hilar masses. Clear lungs.  No convincing pleural effusion.  No pneumothorax. Skeletal structures are grossly intact. IMPRESSION: No acute cardiopulmonary disease. Electronically Signed   By: Lajean Manes M.D.   On: 05/07/2022 10:32    Procedures .Critical Care Performed by: Noemi Chapel, MD Authorized by: Noemi Chapel, MD   Critical care provider statement:    Critical care time (minutes):  30   Critical care was necessary to treat or prevent imminent or life-threatening deterioration of the following conditions:  Sepsis   Critical care was time spent personally by me on the following activities:  Development of treatment plan with patient or surrogate, discussions with consultants, evaluation of patient's response to treatment, examination of patient, ordering and review of laboratory studies, ordering and review of radiographic studies, ordering and performing treatments and interventions, pulse oximetry, re-evaluation of patient's condition, review of old charts and obtaining history from patient or surrogate   I assumed direction of critical care for this patient from another provider in my specialty: no     Care discussed with: admitting provider   Comments:          Medications Ordered in ED Medications  cefTRIAXone (ROCEPHIN) 2 g in sodium chloride 0.9 % 100 mL IVPB (has no administration in time range)  morphine (PF) 4 MG/ML injection 4 mg (has no administration in time range)  0.9 %  sodium chloride infusion (has no administration in time range)  acetaminophen (TYLENOL) tablet 1,000 mg (1,000 mg Oral Given 05/07/22 1220)  iohexol (OMNIPAQUE) 300 MG/ML solution 100 mL (100 mLs Intravenous Contrast Given 05/07/22 1254)    ED Course/ Medical Decision Making/ A&P                           Medical Decision Making Amount and/or Complexity of Data Reviewed Labs: ordered. Radiology:  ordered.  Risk OTC drugs. Prescription drug management. Decision regarding hospitalization.   This patient presents to the ED for concern of imbalance, this involves an extensive number of  treatment options, and is a complaint that carries with it a high risk of complications and morbidity.  The differential diagnosis includes altered mental status, toxic metabolic abnormalities, infectious abnormalities, consider stroke, hemorrhage, trauma,   Co morbidities that complicate the patient evaluation  Hypertension, A-fib, on Eliquis due to history of pulmonary embolism   Additional history obtained:  Additional history obtained from electronic medical record and the paramedic External records from outside source obtained and reviewed including prior CT scan from 2 years ago showing subarachnoid and subdural hematomas, required surgery   Lab Tests:  I Ordered, and personally interpreted labs.  The pertinent results include: A CBC with a leukocytosis of 16,900, normal platelets and hemoglobin, metabolic panel showing normal electrolytes and LFTs, lactic acid and blood cultures added secondary to what appears to be sepsis type syndrome  Imaging Studies ordered:  I ordered imaging studies including portable chest x-ray which was negative for any acute findings, CT scan of the abdomen and pelvis was also performed I independently visualized and interpreted imaging which showed acute cholecystitis I agree with the radiologist interpretation   Cardiac Monitoring: / EKG:  The patient was maintained on a cardiac monitor.  I personally viewed and interpreted the cardiac monitored which showed an underlying rhythm of: Normal heart rate, atrial fibrillation   Consultations Obtained:  I requested consultation with the general surgeon Dr. Rosendo Gros,  and discussed lab and imaging findings as well as pertinent plan - they recommend: Admission to the medical service, they will consult from a  surgical perspective We will consult with the hospitalist for admission   Problem List / ED Course / Critical interventions / Medication management  Sepsis, leukocytosis with a fever secondary to a primary intra-abdominal source, Rocephin was given per guidelines, blood cultures and lactate were obtained prior to antibiotics.  The patient is not in shock at this time, he is not tachycardic or hypotensive, lactic acid I ordered medication including Rocephin, morphine and IV fluid for sepsis and acute cholecystitis Reevaluation of the patient after these medicines showed that the patient improved I have reviewed the patients home medicines and have made adjustments as needed   Social Determinants of Health:  None   Test / Admission - Considered:  We will admit to the hospitalist D/w Dr. Rogers Blocker - will come to admit         Final Clinical Impression(s) / ED Diagnoses Final diagnoses:  Acute cholecystitis  Sepsis without acute organ dysfunction, due to unspecified organism [facility])     Noemi Chapel, MD 05/07/22 1342

## 2022-05-07 NOTE — Assessment & Plan Note (Addendum)
--   With tachypnea, leukocytosis, lactic acidosis on admission.  Overall stable hemodynamically.  WBC decreased, afebrile. --sepsis symptomatology resolved.

## 2022-05-07 NOTE — Consult Note (Addendum)
Reason for Consult: Abdominal pain, cholecystitis Referring Physician: Dr. Horton Chin Pike is an 81 y.o. male.  HPI: Patient is a 81 year old male, with history of pulmonary embolism-on Eliquis,  CHF, history of subdural hematoma who comes in secondary to unsteady balance.  Patient states that he has had some ongoing abdominal pain over the last 2 years.  He states that in the last 6 months he feels that this become more frequent.  He states that secondary to his balance issues today he came to the ER.  Patient denies any fall.  Patient underwent CT scan which was significant for gallbladder wall thickening, and pericholecystic inflammation.  There was no stone that was visualized.  Patient did have a leukocytosis of 16.9.  Patient with normal LFTs.  I did review the CT scan and laboratory studies personally.  General surgery was consulted for further evaluation and management.  Of note patient states he last took his Eliquis Saturday night.  Past Medical History:  Diagnosis Date   Allergy    Anxiety    Arthritis    Asthma    Cancer ([place])    skin   CHF (congestive heart failure) (HCC)    chornic systolic CHF   DCM (dilated cardiomyopathy) ([place]) 04/18/2017   ED 45% by echo at [facility]   Depression    Diverticulosis    Dysrhythmia    Eosinophilic esophagitis 9/47/6546   EGD 04/2014 17 Eos/hpf esophageal bxs   Eye abnormalities    calcium deposits behind eyes   Frequent PVCs    GERD (gastroesophageal reflux disease)    Hx of colonic polyps 12/18/2011   Hyperlipidemia    Hypertension    Hypothyroidism    SDH (subdural hematoma) ([place]) 11/30/2020   Stroke ([place])    2 TIAs   Thyroid disease    hypothyroid    Past Surgical History:  Procedure Laterality Date   APPENDECTOMY     BURR HOLE Right 02/07/2021   Procedure: Right Burr holes for subdural hematoma;  Surgeon: Ashok Pall, MD;  Location: [facility];  Service: Neurosurgery;  Laterality: Right;  Right Burr holes for subdural  hematoma   COLONOSCOPY  10/02/2006, 12/18/2011   ESOPHAGOSCOPY  12/18/2011   with Venia Minks dilation   KNEE ARTHROSCOPY     both knees   SHOULDER ARTHROSCOPY     left shoulder, both knees   SHOULDER SURGERY     right surgery   TONSILLECTOMY     UPPER GASTROINTESTINAL ENDOSCOPY  02/04/2009   with Venia Minks dilation    Family History  Problem Relation Age of Onset   Mesothelioma Paternal Aunt    Mesothelioma Paternal Uncle    Colon cancer Neg Hx    Esophageal cancer Neg Hx    Stomach cancer Neg Hx    Rectal cancer Neg Hx     Social History:  reports that he has never smoked. He has never used smokeless tobacco. He reports that he does not drink alcohol and does not use drugs.  Allergies: No Known Allergies  Medications: I have reviewed the patient's current medications.  Results for orders placed or performed during the hospital encounter of 05/07/22 (from the past 48 hour(s))  Resp Panel by RT-PCR (Flu A&B, Covid)     Status: None   Collection Time: 05/07/22  7:44 AM   Specimen: Nasal Swab  Result Value Ref Range   SARS Coronavirus 2 by RT PCR NEGATIVE NEGATIVE    Comment: (NOTE) SARS-CoV-2 target nucleic acids are  NOT DETECTED.  The SARS-CoV-2 RNA is generally detectable in upper respiratory specimens during the acute phase of infection. The lowest concentration of SARS-CoV-2 viral copies this assay can detect is 138 copies/mL. A negative result does not preclude SARS-Cov-2 infection and should not be used as the sole basis for treatment or other patient management decisions. A negative result may occur with  improper specimen collection/handling, submission of specimen other than nasopharyngeal swab, presence of viral mutation(s) within the areas targeted by this assay, and inadequate number of viral copies(<138 copies/mL). A negative result must be combined with clinical observations, patient history, and epidemiological information. The expected result is  Negative.  Fact Sheet for Patients:  EntrepreneurPulse.com.au  Fact Sheet for Healthcare Providers:  IncredibleEmployment.be  This test is no t yet approved or cleared by the [place] FDA and  has been authorized for detection and/or diagnosis of SARS-CoV-2 by FDA under an Emergency Use Authorization (EUA). This EUA will remain  in effect (meaning this test can be used) for the duration of the COVID-19 declaration under Section 564(b)(1) of the Act, 21 U.S.C.section 360bbb-3(b)(1), unless the authorization is terminated  or revoked sooner.       Influenza A by PCR NEGATIVE NEGATIVE   Influenza B by PCR NEGATIVE NEGATIVE    Comment: (NOTE) The Xpert Xpress SARS-CoV-2/FLU/RSV plus assay is intended as an aid in the diagnosis of influenza from Nasopharyngeal swab specimens and should not be used as a sole basis for treatment. Nasal washings and aspirates are unacceptable for Xpert Xpress SARS-CoV-2/FLU/RSV testing.  Fact Sheet for Patients: EntrepreneurPulse.com.au  Fact Sheet for Healthcare Providers: IncredibleEmployment.be  This test is not yet approved or cleared by the [place] FDA and has been authorized for detection and/or diagnosis of SARS-CoV-2 by FDA under an Emergency Use Authorization (EUA). This EUA will remain in effect (meaning this test can be used) for the duration of the COVID-19 declaration under Section 564(b)(1) of the Act, 21 U.S.C. section 360bbb-3(b)(1), unless the authorization is terminated or revoked.  Performed at [facility], [place] [address], [place], [place] [address]   Urine rapid drug screen (hosp performed)     Status: Abnormal   Collection Time: 05/07/22  7:44 AM  Result Value Ref Range   Opiates NONE DETECTED NONE DETECTED   Cocaine NONE DETECTED NONE DETECTED   Benzodiazepines POSITIVE (A) NONE DETECTED   Amphetamines NONE DETECTED NONE DETECTED    Tetrahydrocannabinol NONE DETECTED NONE DETECTED   Barbiturates NONE DETECTED NONE DETECTED    Comment: (NOTE) DRUG SCREEN FOR MEDICAL PURPOSES ONLY.  IF CONFIRMATION IS NEEDED FOR ANY PURPOSE, NOTIFY LAB WITHIN 5 DAYS.  LOWEST DETECTABLE LIMITS FOR URINE DRUG SCREEN Drug Class                     Cutoff (ng/mL) Amphetamine and metabolites    1000 Barbiturate and metabolites    200 Benzodiazepine                 742 Tricyclics and metabolites     300 Opiates and metabolites        300 Cocaine and metabolites        300 THC                            50 Performed at [facility], [place] [address], [place], [place] [address]   Urinalysis, Routine w reflex microscopic  Status: None   Collection Time: 05/07/22  7:44 AM  Result Value Ref Range   Color, Urine YELLOW YELLOW   APPearance CLEAR CLEAR   Specific Gravity, Urine 1.018 1.005 - 1.030   pH 6.0 5.0 - 8.0   Glucose, UA NEGATIVE NEGATIVE mg/dL   Hgb urine dipstick NEGATIVE NEGATIVE   Bilirubin Urine NEGATIVE NEGATIVE   Ketones, ur NEGATIVE NEGATIVE mg/dL   Protein, ur NEGATIVE NEGATIVE mg/dL   Nitrite NEGATIVE NEGATIVE   Leukocytes,Ua NEGATIVE NEGATIVE    Comment: Performed at [facility] [address], [place], [place] [address]  Ethanol     Status: None   Collection Time: 05/07/22  7:51 AM  Result Value Ref Range   Alcohol, Ethyl (B) <10 <10 mg/dL    Comment: (NOTE) Lowest detectable limit for serum alcohol is 10 mg/dL.  For medical purposes only. Performed at [facility], [place] [address], [place], [place] [address]   Protime-INR     Status: None   Collection Time: 05/07/22  7:51 AM  Result Value Ref Range   Prothrombin Time 15.0 11.4 - 15.2 seconds   INR 1.2 0.8 - 1.2    Comment: (NOTE) INR goal varies based on device and disease states. Performed at [facility], [place] [address], [place], [place] [address]   APTT     Status: None   Collection Time: 05/07/22  7:51 AM  Result  Value Ref Range   aPTT 26 24 - 36 seconds    Comment: Performed at [facility] [address] [place], [place] [address]  CBC     Status: Abnormal   Collection Time: 05/07/22  7:51 AM  Result Value Ref Range   WBC 16.9 (H) 4.0 - 10.5 K/uL   RBC 4.78 4.22 - 5.81 MIL/uL   Hemoglobin 14.4 13.0 - 17.0 g/dL   HCT 43.3 39.0 - 52.0 %   MCV 90.6 80.0 - 100.0 fL   MCH 30.1 26.0 - 34.0 pg   MCHC 33.3 30.0 - 36.0 g/dL   RDW 14.9 11.5 - 15.5 %   Platelets 232 150 - 400 K/uL   nRBC 0.0 0.0 - 0.2 %    Comment: Performed at [facility], [place] [address] [place], [place] [address]  Differential     Status: Abnormal   Collection Time: 05/07/22  7:51 AM  Result Value Ref Range   Neutrophils Relative % 88 %   Neutro Abs 15.1 (H) 1.7 - 7.7 K/uL   Lymphocytes Relative 3 %   Lymphs Abs 0.4 (L) 0.7 - 4.0 K/uL   Monocytes Relative 8 %   Monocytes Absolute 1.3 (H) 0.1 - 1.0 K/uL   Eosinophils Relative 0 %   Eosinophils Absolute 0.0 0.0 - 0.5 K/uL   Basophils Relative 0 %   Basophils Absolute 0.1 0.0 - 0.1 K/uL   Immature Granulocytes 1 %   Abs Immature Granulocytes 0.10 (H) 0.00 - 0.07 K/uL    Comment: Performed at [facility] [address], [place], [place] [address]  Comprehensive metabolic panel     Status: Abnormal   Collection Time: 05/07/22  7:51 AM  Result Value Ref Range   Sodium 136 135 - 145 mmol/L   Potassium 3.1 (L) 3.5 - 5.1 mmol/L   Chloride 96 (L) 98 - 111 mmol/L   CO2 26 22 - 32 mmol/L   Glucose, Bld 134 (H) 70 - 99 mg/dL  Comment: Glucose reference range applies only to samples taken after fasting for at least 8 hours.   BUN 13 8 - 23 mg/dL   Creatinine, Ser 1.04 0.61 - 1.24 mg/dL   Calcium 9.2 8.9 - 10.3 mg/dL   Total Protein 6.9 6.5 - 8.1 g/dL   Albumin 4.1 3.5 - 5.0 g/dL   AST 31 15 - 41 U/L   ALT 18 0 - 44 U/L   Alkaline Phosphatase 33 (L) 38 - 126 U/L   Total Bilirubin 1.1 0.3 - 1.2 mg/dL   GFR, Estimated >60 >60 mL/min    Comment:  (NOTE) Calculated using the CKD-EPI Creatinine Equation (2021)    Anion gap 14 5 - 15    Comment: Performed at [facility] [address], [place], [place] [address]  I-stat chem 8, ED     Status: Abnormal   Collection Time: 05/07/22  7:59 AM  Result Value Ref Range   Sodium 133 (L) 135 - 145 mmol/L   Potassium 3.1 (L) 3.5 - 5.1 mmol/L   Chloride 94 (L) 98 - 111 mmol/L   BUN 16 8 - 23 mg/dL   Creatinine, Ser 0.90 0.61 - 1.24 mg/dL   Glucose, Bld 127 (H) 70 - 99 mg/dL    Comment: Glucose reference range applies only to samples taken after fasting for at least 8 hours.   Calcium, Ion 1.02 (L) 1.15 - 1.40 mmol/L   TCO2 29 22 - 32 mmol/L   Hemoglobin 15.6 13.0 - 17.0 g/dL   HCT 46.0 39.0 - 52.0 %  Lactic acid, plasma     Status: Abnormal   Collection Time: 05/07/22 11:50 AM  Result Value Ref Range   Lactic Acid, Venous 3.3 (HH) 0.5 - 1.9 mmol/L    Comment: CRITICAL RESULT CALLED TO, READ BACK BY AND VERIFIED WITH: BRITTANY BECK RN.'@1330'$  ON 5.28.23 BY TCALDWELL MT. Performed at [facility], [place] [address] [place], [place] [address]     CT HEAD WO CONTRAST  Result Date: 05/07/2022 CLINICAL DATA:  81 year old male with altered mental status. EXAM: CT HEAD WITHOUT CONTRAST TECHNIQUE: Contiguous axial images were obtained from the base of the skull through the vertex without intravenous contrast. RADIATION DOSE REDUCTION: This exam was performed according to the departmental dose-optimization program which includes automated exposure control, adjustment of the mA and/or kV according to patient size and/or use of iterative reconstruction technique. COMPARISON:  12/14/2021 prior studies FINDINGS: Brain: No evidence of acute infarction, hemorrhage, hydrocephalus, extra-axial collection or mass lesion/mass effect. Mild atrophy and chronic small-vessel white matter ischemic changes again noted. Vascular: Mild carotid atherosclerotic calcifications are noted. Skull: Normal. Negative  for fracture or focal lesion. Sinuses/Orbits: No acute finding. Other: None. IMPRESSION: 1. No evidence of acute intracranial abnormality. 2. Mild atrophy and chronic small-vessel white matter ischemic changes. Electronically Signed   By: Margarette Canada M.D.   On: 05/07/2022 08:05   CT ABDOMEN PELVIS W CONTRAST  Result Date: 05/07/2022 CLINICAL DATA:  Abdominal pain. EXAM: CT ABDOMEN AND PELVIS WITH CONTRAST TECHNIQUE: Multidetector CT imaging of the abdomen and pelvis was performed using the standard protocol following bolus administration of intravenous contrast. RADIATION DOSE REDUCTION: This exam was performed according to the departmental dose-optimization program which includes automated exposure control, adjustment of the mA and/or kV according to patient size and/or use of iterative reconstruction technique. CONTRAST:  116m OMNIPAQUE IOHEXOL 300 MG/ML  SOLN COMPARISON:  None Available. FINDINGS: Lower chest: No acute findings.  Mild  cardiomegaly. Hepatobiliary: Liver normal in size and overall attenuation. 5 mm low attenuation lesion, segment 6, consistent with a cyst. No other masses or lesions. Gallbladder is distended with evidence of wall thickening and pericholecystic inflammation. No visualized stone. No duct dilation. Pancreas: Partial fatty replacement. No pancreatic mass or inflammation. No duct dilation. Spleen: Normal in size without focal abnormality. Adrenals/Urinary Tract: Normal adrenal glands. Kidneys normal size, orientation and position. 2.1 cm posterior upper pole right renal cyst. No other renal masses, no stones and no hydronephrosis. Normal ureters. Normal bladder. Stomach/Bowel: Normal stomach. Small bowel and colon are normal in caliber. No wall thickening. No inflammation. Mild generalized increase in the colonic stool burden. Vascular/Lymphatic: Aortic atherosclerosis. No aneurysm. No enlarged lymph nodes. Reproductive: Unremarkable. Other: No abdominal wall hernia or abnormality.  No abdominopelvic ascites. Musculoskeletal: No fracture or acute finding.  No bone lesion. IMPRESSION: 1. Findings consistent with acute cholecystitis. No visible gallstone. Consider follow-up limited right upper quadrant ultrasound to assess for gallstones. 2. No other acute abnormality within the abdomen or pelvis. 3. Aortic atherosclerosis. Electronically Signed   By: Lajean Manes M.D.   On: 05/07/2022 13:04   DG Chest Port 1 View  Result Date: 05/07/2022 CLINICAL DATA:  Weakness.  Leukocytosis. EXAM: PORTABLE CHEST 1 VIEW COMPARISON:  05/23/2012.  CT, 02/10/2021. FINDINGS: Mild enlargement of the cardiac silhouette, stable. No mediastinal or hilar masses. Clear lungs.  No convincing pleural effusion.  No pneumothorax. Skeletal structures are grossly intact. IMPRESSION: No acute cardiopulmonary disease. Electronically Signed   By: Lajean Manes M.D.   On: 05/07/2022 10:32    Review of Systems  Constitutional:  Negative for chills and fever.  HENT:  Negative for ear discharge, hearing loss and sore throat.   Eyes:  Negative for discharge.  Respiratory:  Negative for cough and shortness of breath.   Cardiovascular:  Negative for chest pain and leg swelling.  Gastrointestinal:  Positive for abdominal pain, diarrhea, nausea and vomiting. Negative for constipation.  Musculoskeletal:  Negative for myalgias and neck pain.  Skin:  Negative for rash.  Allergic/Immunologic: Negative for environmental allergies.  Neurological:  Negative for dizziness and seizures.  Hematological:  Does not bruise/bleed easily.  Psychiatric/Behavioral:  Negative for suicidal ideas.   All other systems reviewed and are negative. Blood pressure 113/70, pulse 75, temperature (!) 101.9 F (38.8 C), temperature source Rectal, resp. rate 16, height '5\' 10"'$  (1.778 m), weight 90.7 kg, SpO2 96 %. Physical Exam Constitutional:      Appearance: He is well-developed.     Comments: Conversant No acute distress  HENT:     Head:  Normocephalic and atraumatic.  Eyes:     General: Lids are normal. No scleral icterus.    Pupils: Pupils are equal, round, and reactive to light.     Comments: Pupils are equal round and reactive No lid lag Moist conjunctiva  Neck:     Thyroid: No thyromegaly.     Trachea: No tracheal tenderness.     Comments: No cervical lymphadenopathy Cardiovascular:     Rate and Rhythm: Normal rate and regular rhythm.     Heart sounds: No murmur heard. Pulmonary:     Effort: Pulmonary effort is normal.     Breath sounds: Normal breath sounds. No wheezing or rales.  Abdominal:     Tenderness: There is abdominal tenderness in the right upper quadrant. There is no guarding or rebound.     Hernia: No hernia is present.  Musculoskeletal:  Cervical back: Normal range of motion and neck supple.  Skin:    General: Skin is warm.     Findings: No rash.     Nails: There is no clubbing.     Comments: Normal skin turgor  Neurological:     Mental Status: He is alert and oriented to person, place, and time.     Comments: Normal gait and station  Psychiatric:        Mood and Affect: Mood normal.        Thought Content: Thought content normal.        Judgment: Judgment normal.     Comments: Appropriate affect    Assessment/Plan: 81 year old male with acute cholecystitis Sepsis History of DVT and PE CHF  1.  This time would recommend ongoing treatment for sepsis as well as antibiotics.  Patient will likely require lap chole in the next 2 to 3 days once his Eliquis is cleared.  We will continue to follow along. 2.  Recommend medical admission.  Will need cardiac eval  Ralene Ok 05/07/2022, 2:15 PM

## 2022-05-07 NOTE — TOC Initial Note (Addendum)
Transition of Care [facility]) - Initial/Assessment Note    Patient Details  Name: GEAN LAROSE MRN: [id number] Date of Birth: [birth year]  Transition of Care [facility]) CM/SW Contact:    Verdell Carmine, RN Phone Number: 05/07/2022, 2:15 PM  Clinical Narrative:                  Daughter at desk, voicing concerns regarding care of her mother. She and her daughter state that patient refuses to give much information about wife to anyone.They do not know her medication regimin,  and cannot lift her or provide adequete care for her.    He takes sole care of her. He will not be able to lift post surgically. Discussed options of hiring round the clock care,  Speaking frankly regarding care of mother post-surgical and getting information from their father regarding this. Speaking to her primary care doctor for temporary placement, but patient would have to consent to this, and they will not be available until Tuesday.  Daughter talking to patient regarding care of her mother and medications , He discussed getting his additional caregiver Shelton Silvas to assist at home. Daughter talked to the  patient frankly  about possibility of them  being unable to care for mother. Discussions ongoing.   Patient presented for ataxic gate, febrile, Cholecystitis.  Will have surgery Tuesday or Wednesday. PT and OT consults for gait disturbance.Marland Kitchen   TOC will follow for needs, recommendations, and transitions of care.  1540 With patients permission verbally, he agreed for his daughter Earnest Bailey to be a contact and to have information, including medication list  regarding his wife and himself.Hanley Seamen her list of medications last time she was here, PCP name and this RNCM number. She feels like she does not have the resources to take care of her . The normal caretaker is out of town, another caretaker was left a message, but do not know if she is available. The alternate plan would be to bring  his wife in for an evaluation.    Patient  Goals and CMS Choice        Expected Discharge Plan and Services      Home self care, possible HH or OP PT                                          Prior Living Arrangements/Services                       Activities of Daily Living      Permission Sought/Granted                  Emotional Assessment              Admission diagnosis:  Acute cholecystitis [K81.0] Patient Active Problem List   Diagnosis Date Noted   sepsis secondary to acute cholecystitis  71/05/2693   Chronic systolic CHF (congestive heart failure) ([place]) 05/07/2022   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   Sepsis ([place]) 05/07/2022   Hypokalemia    Essential hypertension    Vascular headache    Hyponatremia    Hypothyroidism    Bilateral pulmonary embolism (HCC)    Subdural hematoma ([place]) 02/07/2021   Subdural hematoma, 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 [place] [address]Phone: [phone]: [phone] [facility] [address]: [phone]: [phone]    Social Determinants of Health (SDOH) Interventions    Readmission Risk Interventions    02/16/2021   12:37 PM  Readmission Risk Prevention Plan  Post Dischage Appt Not Complete  Appt Comments per MD f/u within 3 wks  Medication Screening Complete  Transportation Screening Complete

## 2022-05-07 NOTE — Assessment & Plan Note (Addendum)
--  stable, followed by [facility] pulmonology --Continue inhalers: symbicort, spiriva and saba prn

## 2022-05-07 NOTE — Assessment & Plan Note (Addendum)
Resume home BP meds at discharge, metoprolol, amlodipine, HCTZ

## 2022-05-07 NOTE — ED Triage Notes (Signed)
Pt BIB [facility] from home c/o unsteady gait. EMS came last night for CP but the pt did not come to the hospital. LVO is negative unknown last known well. Pt is normally a runner so family stated his gait is abnormal. Pt states the unsteady gait started yesterday.

## 2022-05-07 NOTE — Assessment & Plan Note (Addendum)
--  Followed by [facility] cardiology, last seen November 2022 at which time amiodarone was continued to suppress PVCs and was noted to have low normal LVEF of 50%. --echo here shows LVEF 60 to 65%, normal function, no regional wall motion abnormalities, grade 1 diastolic dysfunction.  RV systolic function normal.  No significant valvular abnormalities noted. --No signs or symptoms of volume overload and not on any diuretics. --stable, continue metoprolol

## 2022-05-07 NOTE — Assessment & Plan Note (Addendum)
--  PMH 31% burden by holter, leading to start on amiodarone and beta blocker with near complete suppression (burden <1%)  --telemetry SR, EKG SR, no acute changes --Continue amiodarone. Resume Toprol-XL

## 2022-05-07 NOTE — Assessment & Plan Note (Addendum)
--  continue '2mg'$  xanax at bedtime, PRN Ativan as at home

## 2022-05-07 NOTE — Assessment & Plan Note (Addendum)
--  Continue levothyroxine; TSH high but refused increased dose in past - needs f/u thyroid functino tests outpatient

## 2022-05-07 NOTE — Assessment & Plan Note (Addendum)
--  replete

## 2022-05-07 NOTE — Assessment & Plan Note (Addendum)
--  Hx of provoked PE during hospitalization for SDH in 02/2021 due to prolonged immobilization. Repeat CTA of chest in 5/22 showed emboli had dissolved -- not obviously apparent who is managing anticoagulation outpatient, would recommend reviewing long term anticoagulation plan for him.  Given his recent surgery, will continue anticoagulation at this time.

## 2022-05-08 ENCOUNTER — Inpatient Hospital Stay (HOSPITAL_COMMUNITY): Payer: Medicare Other

## 2022-05-08 DIAGNOSIS — A419 Sepsis, unspecified organism: Secondary | ICD-10-CM | POA: Diagnosis not present

## 2022-05-08 DIAGNOSIS — I493 Ventricular premature depolarization: Secondary | ICD-10-CM

## 2022-05-08 DIAGNOSIS — K810 Acute cholecystitis: Principal | ICD-10-CM

## 2022-05-08 DIAGNOSIS — I5021 Acute systolic (congestive) heart failure: Secondary | ICD-10-CM | POA: Diagnosis not present

## 2022-05-08 DIAGNOSIS — I2699 Other pulmonary embolism without acute cor pulmonale: Secondary | ICD-10-CM

## 2022-05-08 DIAGNOSIS — R6520 Severe sepsis without septic shock: Secondary | ICD-10-CM

## 2022-05-08 LAB — CBC
HCT: 35.7 % — ABNORMAL LOW (ref 39.0–52.0)
Hemoglobin: 12.4 g/dL — ABNORMAL LOW (ref 13.0–17.0)
MCH: 31.2 pg (ref 26.0–34.0)
MCHC: 34.7 g/dL (ref 30.0–36.0)
MCV: 89.7 fL (ref 80.0–100.0)
Platelets: 196 10*3/uL (ref 150–400)
RBC: 3.98 MIL/uL — ABNORMAL LOW (ref 4.22–5.81)
RDW: 15.6 % — ABNORMAL HIGH (ref 11.5–15.5)
WBC: 17 10*3/uL — ABNORMAL HIGH (ref 4.0–10.5)
nRBC: 0 % (ref 0.0–0.2)

## 2022-05-08 LAB — COMPREHENSIVE METABOLIC PANEL
ALT: 14 U/L (ref 0–44)
AST: 27 U/L (ref 15–41)
Albumin: 3.1 g/dL — ABNORMAL LOW (ref 3.5–5.0)
Alkaline Phosphatase: 25 U/L — ABNORMAL LOW (ref 38–126)
Anion gap: 9 (ref 5–15)
BUN: 12 mg/dL (ref 8–23)
CO2: 25 mmol/L (ref 22–32)
Calcium: 8 mg/dL — ABNORMAL LOW (ref 8.9–10.3)
Chloride: 99 mmol/L (ref 98–111)
Creatinine, Ser: 0.98 mg/dL (ref 0.61–1.24)
GFR, Estimated: >60 mL/min (ref >60)
Glucose, Bld: 124 mg/dL — ABNORMAL HIGH (ref 70–99)
Potassium: 3 mmol/L — ABNORMAL LOW (ref 3.5–5.1)
Sodium: 133 mmol/L — ABNORMAL LOW (ref 135–145)
Total Bilirubin: 1.3 mg/dL — ABNORMAL HIGH (ref 0.3–1.2)
Total Protein: 5.6 g/dL — ABNORMAL LOW (ref 6.5–8.1)

## 2022-05-08 LAB — ECHOCARDIOGRAM COMPLETE
Area-P 1/2: 9.85 cm2
Height: 70 in
S' Lateral: 3.1 cm
Weight: 3200 oz

## 2022-05-08 MED ORDER — METOPROLOL SUCCINATE ER 25 MG PO TB24
50.0000 mg | ORAL_TABLET | Freq: Two times a day (BID) | ORAL | Status: DC
  Administered 2022-05-08 – 2022-05-10 (×5): 50 mg via ORAL
  Filled 2022-05-08 (×5): qty 2

## 2022-05-08 MED ORDER — POTASSIUM CHLORIDE CRYS ER 20 MEQ PO TBCR
40.0000 meq | EXTENDED_RELEASE_TABLET | ORAL | Status: AC
  Administered 2022-05-08 (×2): 40 meq via ORAL
  Filled 2022-05-08 (×2): qty 2

## 2022-05-08 MED ORDER — HEPARIN SODIUM (PORCINE) 5000 UNIT/ML IJ SOLN
5000.0000 [IU] | Freq: Three times a day (TID) | INTRAMUSCULAR | Status: AC
  Administered 2022-05-08: 5000 [IU] via SUBCUTANEOUS
  Filled 2022-05-08 (×2): qty 1

## 2022-05-08 MED ORDER — MAGNESIUM SULFATE 4 GM/100ML IV SOLN
4.0000 g | Freq: Once | INTRAVENOUS | Status: AC
  Administered 2022-05-08: 4 g via INTRAVENOUS
  Filled 2022-05-08: qty 100

## 2022-05-08 NOTE — Progress Notes (Signed)
Subjective/Chief Complaint: Ruq pain, thinks he took eliquis Saturday night   Objective: Vital signs in last 24 hours: Temp:  [99 F (37.2 C)-101.9 F (38.8 C)] 100.7 F (38.2 C) (05/29 0757) Pulse Rate:  [71-85] 85 (05/29 0757) Resp:  [16-33] 18 (05/29 0757) BP: (107-150)/(61-95) 115/65 (05/29 0757) SpO2:  [94 %-99 %] 96 % (05/29 0757) Last BM Date : 05/06/22  Intake/Output from previous day: 05/28 0701 - 05/29 0700 In: 276.6 [IV Piggyback:276.6] Out: 500 [Urine:500] Intake/Output this shift: No intake/output data recorded.  GI: soft tender ruq with murphys sign, rlq incision well healed  Lab Results:  Recent Labs    05/07/22 0751 05/07/22 0759 05/08/22 0104  WBC 16.9*  --  17.0*  HGB 14.4 15.6 12.4*  HCT 43.3 46.0 35.7*  PLT 232  --  196   BMET Recent Labs    05/07/22 0751 05/07/22 0759 05/08/22 0104  NA 136 133* 133*  K 3.1* 3.1* 3.0*  CL 96* 94* 99  CO2 26  --  25  GLUCOSE 134* 127* 124*  BUN '13 16 12  '$ CREATININE 1.04 0.90 0.98  CALCIUM 9.2  --  8.0*   PT/INR Recent Labs    05/07/22 0751  LABPROT 15.0  INR 1.2   ABG No results for input(s): PHART, HCO3 in the last 72 hours.  Invalid input(s): PCO2, PO2  Studies/Results: CT HEAD WO CONTRAST  Result Date: 05/07/2022 CLINICAL DATA:  81 year old male with altered mental status. EXAM: CT HEAD WITHOUT CONTRAST TECHNIQUE: Contiguous axial images were obtained from the base of the skull through the vertex without intravenous contrast. RADIATION DOSE REDUCTION: This exam was performed according to the departmental dose-optimization program which includes automated exposure control, adjustment of the mA and/or kV according to patient size and/or use of iterative reconstruction technique. COMPARISON:  12/14/2021 prior studies FINDINGS: Brain: No evidence of acute infarction, hemorrhage, hydrocephalus, extra-axial collection or mass lesion/mass effect. Mild atrophy and chronic small-vessel white matter  ischemic changes again noted. Vascular: Mild carotid atherosclerotic calcifications are noted. Skull: Normal. Negative for fracture or focal lesion. Sinuses/Orbits: No acute finding. Other: None. IMPRESSION: 1. No evidence of acute intracranial abnormality. 2. Mild atrophy and chronic small-vessel white matter ischemic changes. Electronically Signed   By: Margarette Canada M.D.   On: 05/07/2022 08:05   CT ABDOMEN PELVIS W CONTRAST  Result Date: 05/07/2022 CLINICAL DATA:  Abdominal pain. EXAM: CT ABDOMEN AND PELVIS WITH CONTRAST TECHNIQUE: Multidetector CT imaging of the abdomen and pelvis was performed using the standard protocol following bolus administration of intravenous contrast. RADIATION DOSE REDUCTION: This exam was performed according to the departmental dose-optimization program which includes automated exposure control, adjustment of the mA and/or kV according to patient size and/or use of iterative reconstruction technique. CONTRAST:  120m OMNIPAQUE IOHEXOL 300 MG/ML  SOLN COMPARISON:  None Available. FINDINGS: Lower chest: No acute findings.  Mild cardiomegaly. Hepatobiliary: Liver normal in size and overall attenuation. 5 mm low attenuation lesion, segment 6, consistent with a cyst. No other masses or lesions. Gallbladder is distended with evidence of wall thickening and pericholecystic inflammation. No visualized stone. No duct dilation. Pancreas: Partial fatty replacement. No pancreatic mass or inflammation. No duct dilation. Spleen: Normal in size without focal abnormality. Adrenals/Urinary Tract: Normal adrenal glands. Kidneys normal size, orientation and position. 2.1 cm posterior upper pole right renal cyst. No other renal masses, no stones and no hydronephrosis. Normal ureters. Normal bladder. Stomach/Bowel: Normal stomach. Small bowel and colon are normal in caliber. No  wall thickening. No inflammation. Mild generalized increase in the colonic stool burden. Vascular/Lymphatic: Aortic  atherosclerosis. No aneurysm. No enlarged lymph nodes. Reproductive: Unremarkable. Other: No abdominal wall hernia or abnormality. No abdominopelvic ascites. Musculoskeletal: No fracture or acute finding.  No bone lesion. IMPRESSION: 1. Findings consistent with acute cholecystitis. No visible gallstone. Consider follow-up limited right upper quadrant ultrasound to assess for gallstones. 2. No other acute abnormality within the abdomen or pelvis. 3. Aortic atherosclerosis. Electronically Signed   By: Lajean Manes M.D.   On: 05/07/2022 13:04   DG Chest Port 1 View  Result Date: 05/07/2022 CLINICAL DATA:  Weakness.  Leukocytosis. EXAM: PORTABLE CHEST 1 VIEW COMPARISON:  05/23/2012.  CT, 02/10/2021. FINDINGS: Mild enlargement of the cardiac silhouette, stable. No mediastinal or hilar masses. Clear lungs.  No convincing pleural effusion.  No pneumothorax. Skeletal structures are grossly intact. IMPRESSION: No acute cardiopulmonary disease. Electronically Signed   By: Lajean Manes M.D.   On: 05/07/2022 10:32    Anti-infectives: Anti-infectives (From admission, onward)    Start     Dose/Rate Route Frequency Ordered Stop   05/07/22 1415  piperacillin-tazobactam (ZOSYN) IVPB 3.375 g        3.375 g 12.5 mL/hr over 240 Minutes Intravenous Every 8 hours 05/07/22 1408     05/07/22 1315  cefTRIAXone (ROCEPHIN) 2 g in sodium chloride 0.9 % 100 mL IVPB        2 g 200 mL/hr over 30 Minutes Intravenous  Once 05/07/22 1313 05/07/22 1459       Assessment/Plan: 81 year old male with acute cholecystitis -will check an [place] today to ensure stones for completeness sake -I think will likely need lap chole tomorrow once eliquis off for 48 hours -can have dvt prophylaxis now as his vte event was 2022 with sdh he says -he has been having symptoms for one month so we did pending [place] discuss possibility of drain History of DVT and PE CHF-cards eval prior to surgery recommended  I reviewed ED provider notes,  hospitalist notes, last 24 h vitals and pain scores, last 48 h intake and output, last 24 h labs and trends, and last 24 h imaging results.  This care required moderate level of medical decision making.      Rolm Bookbinder 05/08/2022

## 2022-05-08 NOTE — Progress Notes (Signed)
Echocardiogram 2D Echocardiogram has been performed.  John Mack 05/08/2022, 10:36 AM

## 2022-05-08 NOTE — Evaluation (Addendum)
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   Clinical Impression   PT admitted with acute cholecystitis. Pt currently with functional limitiations due to the deficits listed below (see OT problem list). Pt with R lateral pain that limits his progression some. Pt is guarded with mobility but able to make bed transfer and static stand transfer min guard (A) with HOB 45 degrees to his L side. Pt likely will progress well after surgery. Pt report wife is admitted in the ED currently and wants to talk  to [facility] regarding her care.  Pt will benefit from skilled OT to increase their independence and safety with adls and balance to allow discharge [place].    Provided spirometer with education during session due to wet sounding cough   Recommendations for follow up therapy are one component of a multi-disciplinary discharge planning process, led by the attending physician.  Recommendations may be updated based on patient status, additional functional criteria and insurance authorization.   Follow Up Recommendations  Home health OT (pending surg)    Assistance Recommended at Discharge Set up Supervision/Assistance  Patient can return home with the following A little help with bathing/dressing/bathroom;Assist for transportation    Functional Status Assessment  Patient has had a recent decline in their functional status and demonstrates the ability to make significant improvements in function in a reasonable and predictable amount of time.  Equipment Recommendations  None recommended by OT    Recommendations for Other Services       Precautions / Restrictions Precautions Precautions:  Fall Precaution Comments: reports a few falls recently      Mobility Bed Mobility Overal bed mobility: Needs Assistance Bed Mobility: Supine to Sit, Sit to Supine     Supine to sit: Min guard, HOB elevated Sit to supine: Min guard, HOB elevated   General bed mobility comments: pt rolling toward L side with hOB 45 degrees and able to power up    Transfers Overall transfer level: Needs assistance Equipment used: Rolling walker (2 wheels) Transfers: Sit to/from Stand Sit to Stand: Min guard           General transfer comment: pt able to power up into standing and states "feels good to be up"      Balance Overall balance assessment: Needs assistance         Standing balance support: Bilateral upper extremity supported, During functional activity, Reliant on assistive device for balance                               ADL either performed or assessed with clinical judgement   ADL Overall ADL's : Needs assistance/impaired Eating/Feeding: Independent   Grooming: Wash/dry hands   Upper Body Bathing: Set up   Lower Body Bathing: Minimal assistance   Upper Body Dressing : Set up   Lower Body Dressing: Minimal assistance Lower Body Dressing Details (indicate cue type and reason): pain control dependent Toilet Transfer: Min guard;Stand-pivot             General ADL Comments: pt progressed to EOB transfer standing with RW for stability this session. pt reports increased R side discomfort with standing but tolerated  well. pt wanting to talk to [facility] regarding wife care. TOC made aware of need to talk to spouse     Vision Baseline Vision/History: 1 Wears glasses Ability to See in Adequate Light: 0 Adequate Additional Comments: wears glasses daily     Perception     Praxis      Pertinent Vitals/Pain Pain Assessment Pain Assessment: Faces Faces Pain Scale: Hurts a little bit Pain Location: R flank pain Pain Descriptors / Indicators:  Discomfort Pain Intervention(s): Monitored during session, Premedicated before session, Repositioned (premedicated with '650mg'$  tylenol)     Hand Dominance Right   Extremity/Trunk Assessment Upper Extremity Assessment Upper Extremity Assessment: Overall WFL for tasks assessed   Lower Extremity Assessment Lower Extremity Assessment: Overall WFL for tasks assessed   Cervical / Trunk Assessment Cervical / Trunk Assessment: Normal   Communication Communication Communication: No difficulties   Cognition Arousal/Alertness: Awake/alert Behavior During Therapy: WFL for tasks assessed/performed Overall Cognitive Status: Impaired/Different from baseline Area of Impairment: Safety/judgement                         Safety/Judgement: Decreased awareness of safety, Decreased awareness of deficits           General Comments       Exercises     Shoulder Instructions      Home Living Family/patient expects to be discharged to:: Private residence Living Arrangements: Spouse/significant other Available Help at Discharge: Family Type of Home: House Home Access: Stairs to enter;Ramped entrance Entrance Stairs-Number of Steps: ramp   Home Layout: Able to live on main level with bedroom/bathroom     Bathroom Shower/Tub: Occupational psychologist: Handicapped height     Home Equipment: Grab bars - tub/shower;Grab bars - toilet;Shower seat   Additional Comments: hoyer lift for wife      Prior Functioning/Environment Prior Level of Function : Independent/Modified Independent             Mobility Comments: runs daily 4 miles ADLs Comments: main caregiver for bedbound wife. has caregiver he hired to (A) 6 hours on certain days but currently she is out of town        OT Problem List: Impaired balance (sitting and/or standing);Decreased activity tolerance;Decreased cognition;Decreased safety awareness;Decreased knowledge of use of DME or AE;Decreased  knowledge of precautions;Obesity;Pain      OT Treatment/Interventions: Self-care/ADL training;Energy conservation;DME and/or AE instruction;Therapeutic activities;Cognitive remediation/compensation;Patient/family education;Balance training    OT Goals(Current goals can be found in the care plan section) Acute Rehab OT Goals Patient Stated Goal: to check on his wife OT Goal Formulation: With patient Time For Goal Achievement: 05/22/22 Potential to Achieve Goals: Good  OT Frequency: Min 2X/week    Co-evaluation              AM-PAC OT "6 Clicks" Daily Activity     Outcome Measure Help from another person eating meals?: None Help from another person taking care of personal grooming?: None Help from another person toileting, which includes using toliet, bedpan, or urinal?: A Little Help from another person bathing (including washing, rinsing, drying)?: A Little Help from another person to put on and taking off regular upper body clothing?: None Help from another person to put on and taking off regular lower body clothing?: A Little 6 Click Score: 21   End of Session Equipment Utilized During Treatment: Rolling walker (2 wheels) Nurse Communication: Mobility status;Precautions  Activity Tolerance: Patient tolerated treatment well Patient left: in bed;with  call bell/phone within reach;with bed alarm set  OT Visit Diagnosis: Unsteadiness on feet (R26.81);Muscle weakness (generalized) (M62.81)                Time: 6948-5462 OT Time Calculation (min): 26 min Charges:  OT General Charges $OT Visit: 1 Visit OT Evaluation $OT Eval Moderate Complexity: 1 Mod   Brynn, OTR/L  Acute Rehabilitation Services Office: [phone] .   Jeri Modena 05/08/2022, 10:20 AM

## 2022-05-08 NOTE — Progress Notes (Signed)
Progress Note   Patient: John Mack YTK:[id number] DOB: Jan 25, 1941 DOA: 05/07/2022     1 DOS: the patient was seen and examined on 05/08/2022   Brief hospital course: 73yom PMH traumatic SDH, pulmonary embolism, systolic CHF presented with epigastric abdominal pain and right upper quadrant pain with radiation to his chest. Admitted for sepsis secondary to acute cholecystitis.  Assessment and Plan: * Severe sepsis ([place]) secondary to acute acalculus cholecystitis -- With tachypnea and leukocytosis on admission and lactic acidosis.  Overall stable hemodynamically.  WBC without significant change, still elevated.  Low-grade temperature noted, febrile on admission.  Continue antibiotics pending surgery. -- Operative intervention likely tomorrow per surgery  Acute cholecystitis --as above --pt avid runner since 1960s, runs 4 miles a day in about 70 minutes; no cardiac complaints, no palpitations on amiodarone and beta-blocker --"chest pain" this admit was actually epigastric/RUQ in nature --echo WNL, EKG nonacute/no acute changes, telemetry SR --will continue amiodarone, Toprol XL. Pt clear for surgery, no further evaluation or cardiology consultation recommended; discussed with Dr. Donne Hazel  Chronic systolic CHF (congestive heart failure) [facility]) --Followed by [facility] cardiology, last seen November 2022 at which time amiodarone was continued to suppress PVCs and was noted to have low normal LVEF of 50%. --echo here shows LVEF 60 to 65%, normal function, no regional wall motion abnormalities, grade 1 diastolic dysfunction.  RV systolic function normal.  No significant valvular abnormalities noted. --No signs or symptoms of volume overload and not on any diuretics. --Condition resolved   Frequent PVCs --PMH 31% burden by holter, leading to start on amiodarone and beta blocker with near complete suppression (burden <1%)  --telemetry SR, EKG SR, no acute changes --Continue amiodarone. Resume  Toprol-XL  Hypokalemia --hypomagnesemia --replete  Bilateral pulmonary embolism (HCC) --Hx of provoked PE during hospitalization for SDH in 02/2021 due to prolonged immobilization. Repeat CTA of chest in 5/22 showed emboli had dissolved Recommended AC for 3-6 months; can probably stop on discharge. --Hold apixaban in setting of surgery   Essential hypertension --stable, will hold Norvasc, HCTZ  Eosinophilic esophagitis -  --Continue PPI BID   Hypothyroidism --Continue levothyroxine; TSH but refused increased dose in past  Asthma, chronic --stable, followed by [facility] pulmonology --Continue inhalers: symbicort, spiriva and saba prn   Anxiety --continue '2mg'$  xanax at bedtime, PRN Ativan as at home         Subjective:  Feels better Still has right-sided upper abd pain "If I sit up it feels like I am being stabbed" Pain does radiate to epigastrum and lower chest No "heart" pain Avid runner since 1960, still runs 4 miles a day taking about 70 minutes No palpitations  Physical Exam: Vitals:   05/08/22 0421 05/08/22 0757 05/08/22 0837 05/08/22 1157  BP: 117/61 115/65  110/67  Pulse: 81 85 86 77  Resp: '20 18 18 17  '$ Temp: 99.9 F (37.7 C) (!) 100.7 F (38.2 C)  98.2 F (36.8 C)  TempSrc: Oral Oral  Oral  SpO2: 95% 96% 95% 96%  Weight:      Height:       Physical Exam Vitals reviewed.  Constitutional:      General: He is not in acute distress.    Appearance: He is not ill-appearing or toxic-appearing.  Cardiovascular:     Rate and Rhythm: Normal rate and regular rhythm.     Heart sounds: No murmur heard.    Comments: telemetry Pulmonary:     Effort: Pulmonary effort is normal. No respiratory distress.  Breath sounds: No wheezing, rhonchi or rales.  Abdominal:     Palpations: Abdomen is soft.  Musculoskeletal:     Right lower leg: No edema.     Left lower leg: No edema.  Neurological:     Mental Status: He is alert.  Psychiatric:        Mood and Affect:  Mood normal.        Behavior: Behavior normal.    Data Reviewed:  K+ 3.0 Mg2+ 1.6 WBC 17 TSH 7.869 RUQ u/s noted  Family Communication: none  Disposition: Status is: Inpatient Remains inpatient appropriate because: sepsis, acute cholecystitis   Planned Discharge Destination: Home    Time spent: 45 minutes  Author: Murray Hodgkins, MD 05/08/2022 12:19 PM  For on call review [email or web address].

## 2022-05-08 NOTE — Evaluation (Addendum)
Physical Therapy Evaluation Patient Details Name: John Mack MRN: [id number] DOB: 18-Jan-1941 Today's Date: 05/08/2022  History of Present Illness  81 yo male admitted acute cholecystitis pending surg 5/30.  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  Clinical Impression  Pt admitted with above diagnosis. Pt was able to ambulate with RW but does drag feet with notable decr dorsiflexion bil LEs. No significant LOB in controlled environment. Pt states he has had falls and that he has had balance impairment since his prior brain surgery. Pt should progress well after surgery and would benefit from PT to maintain current level of function. Noted that MD d/c'd the PT order today after PT had seen pt and notified MD that pt will benefit from continued therapy due to falls PTA.  Will await reorder to continue therapy if MD desires.  Pt currently with functional limitations due to the deficits listed below (see PT Problem List). Pt will benefit from skilled PT to increase their independence and safety with mobility to allow discharge to the venue listed below.          Recommendations for follow up therapy are one component of a multi-disciplinary discharge planning process, led by the attending physician.  Recommendations may be updated based on patient status, additional functional criteria and insurance authorization.  Follow Up Recommendations Home health PT    Assistance Recommended at Discharge Set up Supervision/Assistance  Patient can return home with the following  A little help with walking and/or transfers    Equipment Recommendations Rolling walker (2 wheels);BSC/3in1  Recommendations for Other Services       Functional Status Assessment Patient has had a recent decline in their functional status and demonstrates the ability to make significant improvements in function in a  reasonable and predictable amount of time.     Precautions / Restrictions Precautions Precautions: Fall Precaution Comments: reports a few falls recently Restrictions Weight Bearing Restrictions: No      Mobility  Bed Mobility Overal bed mobility: Needs Assistance Bed Mobility: Supine to Sit, Sit to Supine     Supine to sit: Min guard, HOB elevated Sit to supine: Min guard, HOB elevated   General bed mobility comments: pt rolling toward L side with hOB 45 degrees and able to power up    Transfers Overall transfer level: Needs assistance Equipment used: Rolling walker (2 wheels) Transfers: Sit to/from Stand Sit to Stand: Min guard           General transfer comment: pt able to power up into standing and states "feels good to be up"    Ambulation/Gait Ambulation/Gait assistance: Min guard Gait Distance (Feet): 180 Feet Assistive device: Rolling walker (2 wheels) Gait Pattern/deviations: Step-through pattern, Decreased stride length, Shuffle, Decreased weight shift to left, Decreased dorsiflexion - left   Gait velocity interpretation: 1.31 - 2.62 ft/sec, indicative of limited community ambulator   General Gait Details: Pt was able to ambulate with RW.  Drags feet bil and has more difficulty picking up left LE but also right at times. Pt not unsteady but he does shuffle his feet intermittently and states that his balance has been this way since he had previous brain surgery.  Stairs            Wheelchair Mobility    Modified Rankin (Stroke Patients Only)       Balance Overall balance assessment: Needs assistance Sitting-balance support:  No upper extremity supported, Feet supported Sitting balance-[name]: Fair     Standing balance support: Bilateral upper extremity supported, During functional activity, Reliant on assistive device for balance Standing balance-[name]: Poor Standing balance comment: relies on UE support                              Pertinent Vitals/Pain Pain Assessment Pain Assessment: Faces Faces Pain Scale: Hurts a little bit Pain Location: R flank pain Pain Descriptors / Indicators: Discomfort Pain Intervention(s): Limited activity within patient's tolerance, Monitored during session, Repositioned    Home Living Family/patient expects to be discharged to:: Private residence Living Arrangements: Spouse/significant other Available Help at Discharge: Family Type of Home: House Home Access: Stairs to enter;Ramped entrance   Entrance Stairs-Number of Steps: ramp   Home Layout: Able to live on main level with bedroom/bathroom Home Equipment: Grab bars - tub/shower;Grab bars - toilet;Shower seat Additional Comments: hoyer lift for wife    Prior Function Prior Level of Function : Independent/Modified Independent             Mobility Comments: runs daily 4 miles ADLs Comments: main caregiver for bedbound wife for last 14 years per pt. has caregiver he hired to (A) 6 hours on certain days but currently she is out of town     Journalist, newspaper   Dominant Hand: Right    Extremity/Trunk Assessment   Upper Extremity Assessment Upper Extremity Assessment: Defer to OT evaluation    Lower Extremity Assessment Lower Extremity Assessment: Generalized weakness    Cervical / Trunk Assessment Cervical / Trunk Assessment: Normal  Communication   Communication: No difficulties  Cognition Arousal/Alertness: Awake/alert Behavior During Therapy: WFL for tasks assessed/performed Overall Cognitive Status: Impaired/Different from baseline Area of Impairment: Safety/judgement                         Safety/Judgement: Decreased awareness of safety, Decreased awareness of deficits              General Comments General comments (skin integrity, edema, etc.): VSS    Exercises     Assessment/Plan    PT Assessment Patient needs continued PT services  PT Problem List Decreased  activity tolerance;Decreased balance;Decreased mobility;Decreased knowledge of use of DME;Decreased safety awareness;Decreased knowledge of precautions;Pain       PT Treatment Interventions DME instruction;Gait training;Functional mobility training;Therapeutic activities;Therapeutic exercise;Balance training;Patient/family education    PT Goals (Current goals can be found in the Care Plan section)  Acute Rehab PT Goals Patient Stated Goal: to have surgery tomorrow PT Goal Formulation: With patient Time For Goal Achievement: 05/22/22 Potential to Achieve Goals: Good    Frequency Min 3X/week     Co-evaluation               AM-PAC PT "6 Clicks" Mobility  Outcome Measure Help needed turning from your back to your side while in a flat bed without using bedrails?: None Help needed moving from lying on your back to sitting on the side of a flat bed without using bedrails?: A Little Help needed moving to and from a bed to a chair (including a wheelchair)?: A Little Help needed standing up from a chair using your arms (e.g., wheelchair or bedside chair)?: A Little Help needed to walk in hospital room?: A Little Help needed climbing 3-5 steps with a railing? : A Little 6 Click Score: 19    End  of Session Equipment Utilized During Treatment: Gait belt Activity Tolerance: Patient limited by fatigue Patient left: in bed;with call bell/phone within reach;with bed alarm set Nurse Communication: Mobility status PT Visit Diagnosis: Unsteadiness on feet (R26.81);Muscle weakness (generalized) (M62.81);Repeated falls (R29.6)    Time: 8335-8251 PT Time Calculation (min) (ACUTE ONLY): 19 min   Charges:   PT Evaluation $PT Eval Moderate Complexity: 1 Mod          Aedin Jeansonne M,PT Acute Rehab Services [phone] (pager)   Alvira Philips 05/08/2022, 1:07 PM

## 2022-05-08 NOTE — TOC Initial Note (Signed)
Transition of Care [facility]) - Initial/Assessment Note    Patient Details  Name: John Mack MRN: [id number] Date of Birth: [birth year]  Transition of Care [facility]) CM/SW Contact:    Marilu Favre, RN Phone Number: 05/08/2022, 2:53 PM  Clinical Narrative:                 Spoke to patient at bedside.   Patient from home with wife, however wife currently in ER. He is primary caregiver for his wife. He is arranging [employer] to provide services for his wife until he is able to care for her again. He has spoken to his wife's Education officer, museum .   Discussed PT recommendations for HHPT, walker and 3 in1. Patient states he already has a walker and 3 in1.   He had home health last March and would like same [facility] agency . Per records home health agency was [facility]. Marjory Lies with [facility] accepted referral and is aware patient arranging [employer] for his wife.   Expected Discharge Plan: [place] Barriers to Discharge: Continued Medical Work up   Patient Goals and CMS Choice Patient states their goals for this hospitalization and ongoing recovery are:: to return to home CMS Medicare.gov Compare Post Acute Care list provided to:: Patient Choice offered to / list presented to : Patient  Expected Discharge Plan and Services Expected Discharge Plan: [place]   Discharge Planning Services: CM Consult Post Acute Care Choice: [place] arrangements for the past 2 months: Single Family Home                 DME Arranged: N/A DME Agency: NA       HH Arranged: RN, PT, OT HH Agency: [facility] (now [facility]) Tax inspector) Date [place]: 05/08/22 Time [place]: [place] Representative spoke with at [place]: Marjory Lies  Prior Living Arrangements/Services Living arrangements for the past 2 months: [place] Lives with:: Spouse Patient language and need for interpreter reviewed:: Yes Do you feel  safe going back to the place where you live?: Yes          Current home services: DME Criminal Activity/Legal Involvement Pertinent to Current Situation/Hospitalization: No - Comment as needed  Activities of Daily Living      Permission Sought/Granted   Permission granted to share information with : No              Emotional Assessment Appearance:: Appears stated age Attitude/Demeanor/Rapport: Engaged Affect (typically observed): Accepting Orientation: : Oriented to Self, Oriented to Place, Oriented to  Time, Oriented to Situation Alcohol / Substance Use: Not Applicable Psych Involvement: No (comment)  Admission diagnosis:  Acute cholecystitis [K81.0] Sepsis without acute organ dysfunction, due to unspecified organism [facility]) [A41.9] Patient Active Problem List   Diagnosis Date Noted   Acute cholecystitis 05/08/2022   Severe sepsis ([place]) secondary to acute acalculus cholecystitis 62/37/6283   Chronic systolic CHF (congestive heart failure) ([place]) 05/07/2022   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   Sepsis ([place]) 05/07/2022   Asthma, chronic 05/07/2022   Hypokalemia    Essential hypertension    Vascular headache    Hyponatremia    Hypothyroidism    Bilateral pulmonary embolism (HCC)    Subdural hematoma ([place]) 02/07/2021   Subdural hematoma, 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 [place] [address]Phone: [phone]: [phone] [facility] [address]Phone:  [phone]: [phone]    Social Determinants of Health (SDOH) Interventions    Readmission Risk Interventions    02/16/2021   12:37 PM  Readmission Risk Prevention Plan  Post Dischage Appt Not Complete  Appt Comments per MD f/u within 3 wks  Medication Screening Complete  Transportation Screening Complete

## 2022-05-08 NOTE — Assessment & Plan Note (Signed)
--  s/p lap chole with drain placement 5/30. Gangrenous and necrotic gallbladder. --continue abx

## 2022-05-08 NOTE — Hospital Course (Signed)
34yom PMH traumatic SDH, pulmonary embolism, systolic CHF presented with epigastric abdominal pain and right upper quadrant pain with radiation to his chest. Admitted for sepsis secondary to acute cholecystitis.

## 2022-05-09 ENCOUNTER — Inpatient Hospital Stay (HOSPITAL_COMMUNITY): Payer: Medicare Other | Admitting: Certified Registered Nurse Anesthetist

## 2022-05-09 ENCOUNTER — Encounter (HOSPITAL_COMMUNITY): Payer: Self-pay | Admitting: Family Medicine

## 2022-05-09 ENCOUNTER — Encounter (HOSPITAL_COMMUNITY)
Admission: EM | Disposition: A | Discharge status: Home Health | Payer: Self-pay | Source: Home / Self Care | Attending: Family Medicine

## 2022-05-09 ENCOUNTER — Other Ambulatory Visit: Payer: Self-pay

## 2022-05-09 DIAGNOSIS — R7989 Other specified abnormal findings of blood chemistry: Secondary | ICD-10-CM

## 2022-05-09 DIAGNOSIS — I5022 Chronic systolic (congestive) heart failure: Secondary | ICD-10-CM | POA: Diagnosis not present

## 2022-05-09 DIAGNOSIS — E039 Hypothyroidism, unspecified: Secondary | ICD-10-CM

## 2022-05-09 DIAGNOSIS — K819 Cholecystitis, unspecified: Secondary | ICD-10-CM

## 2022-05-09 DIAGNOSIS — I509 Heart failure, unspecified: Secondary | ICD-10-CM | POA: Diagnosis not present

## 2022-05-09 DIAGNOSIS — I10 Essential (primary) hypertension: Secondary | ICD-10-CM

## 2022-05-09 DIAGNOSIS — I110 Hypertensive heart disease with heart failure: Secondary | ICD-10-CM

## 2022-05-09 DIAGNOSIS — A419 Sepsis, unspecified organism: Secondary | ICD-10-CM | POA: Diagnosis not present

## 2022-05-09 DIAGNOSIS — K810 Acute cholecystitis: Secondary | ICD-10-CM | POA: Diagnosis not present

## 2022-05-09 HISTORY — PX: CHOLECYSTECTOMY: SHX55

## 2022-05-09 LAB — COMPREHENSIVE METABOLIC PANEL
ALT: 13 U/L (ref 0–44)
AST: 21 U/L (ref 15–41)
Albumin: 3 g/dL — ABNORMAL LOW (ref 3.5–5.0)
Alkaline Phosphatase: 30 U/L — ABNORMAL LOW (ref 38–126)
Anion gap: 12 (ref 5–15)
BUN: 16 mg/dL (ref 8–23)
CO2: 25 mmol/L (ref 22–32)
Calcium: 8.2 mg/dL — ABNORMAL LOW (ref 8.9–10.3)
Chloride: 94 mmol/L — ABNORMAL LOW (ref 98–111)
Creatinine, Ser: 1.27 mg/dL — ABNORMAL HIGH (ref 0.61–1.24)
GFR, Estimated: 58 mL/min — ABNORMAL LOW (ref >60)
Glucose, Bld: 116 mg/dL — ABNORMAL HIGH (ref 70–99)
Potassium: 3.3 mmol/L — ABNORMAL LOW (ref 3.5–5.1)
Sodium: 131 mmol/L — ABNORMAL LOW (ref 135–145)
Total Bilirubin: 1.1 mg/dL (ref 0.3–1.2)
Total Protein: 6.1 g/dL — ABNORMAL LOW (ref 6.5–8.1)

## 2022-05-09 LAB — CBC
HCT: 36.2 % — ABNORMAL LOW (ref 39.0–52.0)
Hemoglobin: 12.3 g/dL — ABNORMAL LOW (ref 13.0–17.0)
MCH: 30.8 pg (ref 26.0–34.0)
MCHC: 34 g/dL (ref 30.0–36.0)
MCV: 90.5 fL (ref 80.0–100.0)
Platelets: 195 10*3/uL (ref 150–400)
RBC: 4 MIL/uL — ABNORMAL LOW (ref 4.22–5.81)
RDW: 15.9 % — ABNORMAL HIGH (ref 11.5–15.5)
WBC: 13.2 10*3/uL — ABNORMAL HIGH (ref 4.0–10.5)
nRBC: 0 % (ref 0.0–0.2)

## 2022-05-09 LAB — MAGNESIUM: Magnesium: 2.5 mg/dL — ABNORMAL HIGH (ref 1.7–2.4)

## 2022-05-09 SURGERY — LAPAROSCOPIC CHOLECYSTECTOMY
Anesthesia: General | Site: Abdomen

## 2022-05-09 MED ORDER — ROCURONIUM BROMIDE 10 MG/ML (PF) SYRINGE
PREFILLED_SYRINGE | INTRAVENOUS | Status: AC
  Filled 2022-05-09: qty 20

## 2022-05-09 MED ORDER — PROPOFOL 10 MG/ML IV BOLUS
INTRAVENOUS | Status: DC | PRN
  Administered 2022-05-09: 30 mg via INTRAVENOUS
  Administered 2022-05-09: 100 mg via INTRAVENOUS

## 2022-05-09 MED ORDER — BUPIVACAINE-EPINEPHRINE 0.25% -1:200000 IJ SOLN
INTRAMUSCULAR | Status: DC | PRN
  Administered 2022-05-09: 20 mL

## 2022-05-09 MED ORDER — PHENYLEPHRINE 80 MCG/ML (10ML) SYRINGE FOR IV PUSH (FOR BLOOD PRESSURE SUPPORT)
PREFILLED_SYRINGE | INTRAVENOUS | Status: DC | PRN
  Administered 2022-05-09 (×3): 80 ug via INTRAVENOUS

## 2022-05-09 MED ORDER — BUPIVACAINE-EPINEPHRINE (PF) 0.25% -1:200000 IJ SOLN
INTRAMUSCULAR | Status: AC
  Filled 2022-05-09: qty 30

## 2022-05-09 MED ORDER — AMISULPRIDE (ANTIEMETIC) 5 MG/2ML IV SOLN
10.0000 mg | Freq: Once | INTRAVENOUS | Status: AC | PRN
  Administered 2022-05-09: 10 mg via INTRAVENOUS

## 2022-05-09 MED ORDER — LIDOCAINE 2% (20 MG/ML) 5 ML SYRINGE
INTRAMUSCULAR | Status: AC
  Filled 2022-05-09: qty 10

## 2022-05-09 MED ORDER — AMISULPRIDE (ANTIEMETIC) 5 MG/2ML IV SOLN
INTRAVENOUS | Status: AC
  Filled 2022-05-09: qty 4

## 2022-05-09 MED ORDER — DEXAMETHASONE SODIUM PHOSPHATE 10 MG/ML IJ SOLN
INTRAMUSCULAR | Status: AC
  Filled 2022-05-09: qty 1

## 2022-05-09 MED ORDER — 0.9 % SODIUM CHLORIDE (POUR BTL) OPTIME
TOPICAL | Status: DC | PRN
  Administered 2022-05-09: 1000 mL

## 2022-05-09 MED ORDER — ONDANSETRON HCL 4 MG/2ML IJ SOLN
INTRAMUSCULAR | Status: DC | PRN
  Administered 2022-05-09: 4 mg via INTRAVENOUS

## 2022-05-09 MED ORDER — ORAL CARE MOUTH RINSE: 15.0000 mL | Freq: Once | OROMUCOSAL | Status: AC

## 2022-05-09 MED ORDER — CHLORHEXIDINE GLUCONATE 0.12 % MT SOLN: 15.0000 mL | Freq: Once | OROMUCOSAL | Status: AC

## 2022-05-09 MED ORDER — SUGAMMADEX SODIUM 200 MG/2ML IV SOLN
INTRAVENOUS | Status: DC | PRN
  Administered 2022-05-09: 200 mg via INTRAVENOUS

## 2022-05-09 MED ORDER — HEMOSTATIC AGENTS (NO CHARGE) OPTIME
TOPICAL | Status: DC | PRN
  Administered 2022-05-09: 1

## 2022-05-09 MED ORDER — FENTANYL CITRATE (PF) 100 MCG/2ML IJ SOLN
25.0000 ug | INTRAMUSCULAR | Status: DC | PRN
  Administered 2022-05-09 (×2): 25 ug via INTRAVENOUS

## 2022-05-09 MED ORDER — PROPOFOL 10 MG/ML IV BOLUS
INTRAVENOUS | Status: AC
  Filled 2022-05-09: qty 20

## 2022-05-09 MED ORDER — FENTANYL CITRATE (PF) 250 MCG/5ML IJ SOLN
INTRAMUSCULAR | Status: AC
  Filled 2022-05-09: qty 5

## 2022-05-09 MED ORDER — BENZONATATE 100 MG PO CAPS
200.0000 mg | ORAL_CAPSULE | Freq: Three times a day (TID) | ORAL | Status: DC | PRN
  Administered 2022-05-09 – 2022-05-10 (×2): 200 mg via ORAL
  Filled 2022-05-09 (×2): qty 2

## 2022-05-09 MED ORDER — ROCURONIUM BROMIDE 10 MG/ML (PF) SYRINGE
PREFILLED_SYRINGE | INTRAVENOUS | Status: DC | PRN
  Administered 2022-05-09: 70 mg via INTRAVENOUS

## 2022-05-09 MED ORDER — OXYCODONE HCL 5 MG/5ML PO SOLN: 5.0000 mg | Freq: Once | ORAL | Status: DC | PRN

## 2022-05-09 MED ORDER — SODIUM CHLORIDE 0.9 % IR SOLN
Status: DC | PRN
  Administered 2022-05-09: 1000 mL

## 2022-05-09 MED ORDER — INDIGOTINDISULFONATE SODIUM 8 MG/ML IJ SOLN
INTRAMUSCULAR | Status: DC | PRN
  Administered 2022-05-09: 2.5 mg via INTRAVENOUS

## 2022-05-09 MED ORDER — FENTANYL CITRATE (PF) 100 MCG/2ML IJ SOLN
INTRAMUSCULAR | Status: AC
  Filled 2022-05-09: qty 2

## 2022-05-09 MED ORDER — OXYCODONE HCL 5 MG PO TABS: 5.0000 mg | ORAL_TABLET | Freq: Once | ORAL | Status: DC | PRN

## 2022-05-09 MED ORDER — CHLORHEXIDINE GLUCONATE 0.12 % MT SOLN
OROMUCOSAL | Status: AC
  Administered 2022-05-09: 15 mL via OROMUCOSAL
  Filled 2022-05-09: qty 15

## 2022-05-09 MED ORDER — GUAIFENESIN-DM 100-10 MG/5ML PO SYRP
5.0000 mL | ORAL_SOLUTION | ORAL | Status: DC | PRN
  Administered 2022-05-09 – 2022-05-10 (×4): 5 mL via ORAL
  Filled 2022-05-09 (×4): qty 5

## 2022-05-09 MED ORDER — LIDOCAINE 2% (20 MG/ML) 5 ML SYRINGE
INTRAMUSCULAR | Status: DC | PRN
  Administered 2022-05-09: 60 mg via INTRAVENOUS

## 2022-05-09 MED ORDER — PHENYLEPHRINE 80 MCG/ML (10ML) SYRINGE FOR IV PUSH (FOR BLOOD PRESSURE SUPPORT)
PREFILLED_SYRINGE | INTRAVENOUS | Status: AC
  Filled 2022-05-09: qty 10

## 2022-05-09 MED ORDER — FENTANYL CITRATE (PF) 250 MCG/5ML IJ SOLN
INTRAMUSCULAR | Status: DC | PRN
Start: 2022-05-09 — End: 2022-05-09
  Administered 2022-05-09 (×2): 50 ug via INTRAVENOUS

## 2022-05-09 MED ORDER — ONDANSETRON HCL 4 MG/2ML IJ SOLN
INTRAMUSCULAR | Status: AC
  Filled 2022-05-09: qty 2

## 2022-05-09 MED ORDER — LACTATED RINGERS IV SOLN: INTRAVENOUS | Status: DC

## 2022-05-09 MED ORDER — ONDANSETRON HCL 4 MG/2ML IJ SOLN: 4.0000 mg | Freq: Once | INTRAMUSCULAR | Status: DC | PRN

## 2022-05-09 MED ORDER — ALBUTEROL SULFATE HFA 108 (90 BASE) MCG/ACT IN AERS
INHALATION_SPRAY | RESPIRATORY_TRACT | Status: DC | PRN
  Administered 2022-05-09: 6 via RESPIRATORY_TRACT

## 2022-05-09 MED ORDER — DEXAMETHASONE SODIUM PHOSPHATE 10 MG/ML IJ SOLN
INTRAMUSCULAR | Status: DC | PRN
  Administered 2022-05-09: 5 mg via INTRAVENOUS

## 2022-05-09 MED ORDER — HEPARIN SODIUM (PORCINE) 5000 UNIT/ML IJ SOLN
5000.0000 [IU] | Freq: Three times a day (TID) | INTRAMUSCULAR | Status: AC
Start: 2022-05-09 — End: 2022-05-09
  Administered 2022-05-09 (×2): 5000 [IU] via SUBCUTANEOUS
  Filled 2022-05-09 (×2): qty 1

## 2022-05-09 MED ORDER — POTASSIUM CHLORIDE CRYS ER 20 MEQ PO TBCR
40.0000 meq | EXTENDED_RELEASE_TABLET | ORAL | Status: AC
  Administered 2022-05-09 (×2): 40 meq via ORAL
  Filled 2022-05-09 (×2): qty 2

## 2022-05-09 SURGICAL SUPPLY — 47 items
BAG COUNTER SPONGE SURGICOUNT (BAG) ×2 IMPLANT
BIOPATCH RED 1 DISK 7.0 (GAUZE/BANDAGES/DRESSINGS) ×1 IMPLANT
BLADE CLIPPER SURG (BLADE) ×1 IMPLANT
CANISTER SUCT 3000ML PPV (MISCELLANEOUS) ×2 IMPLANT
CHLORAPREP W/TINT 26 (MISCELLANEOUS) ×2 IMPLANT
CLIP LIGATING HEMO LOK XL GOLD (MISCELLANEOUS) IMPLANT
CLIP LIGATING HEMO O LOK GREEN (MISCELLANEOUS) ×2 IMPLANT
COVER SURGICAL LIGHT HANDLE (MISCELLANEOUS) ×2 IMPLANT
DERMABOND ADVANCED (GAUZE/BANDAGES/DRESSINGS) ×1
DERMABOND ADVANCED .7 DNX12 (GAUZE/BANDAGES/DRESSINGS) ×1 IMPLANT
DRAIN CHANNEL 19F RND (DRAIN) ×1 IMPLANT
DRSG TEGADERM 4X4.75 (GAUZE/BANDAGES/DRESSINGS) ×1 IMPLANT
ELECT REM PT RETURN 9FT ADLT (ELECTROSURGICAL) ×2
ELECTRODE REM PT RTRN 9FT ADLT (ELECTROSURGICAL) ×1 IMPLANT
ENDOLOOP SUT PDS II  0 18 (SUTURE) ×2
ENDOLOOP SUT PDS II 0 18 (SUTURE) IMPLANT
EVACUATOR SILICONE 100CC (DRAIN) ×1 IMPLANT
GLOVE BIOGEL M 7.0 STRL (GLOVE) ×1 IMPLANT
GLOVE BIOGEL PI IND STRL 7.0 (GLOVE) ×1 IMPLANT
GLOVE BIOGEL PI IND STRL 7.5 (GLOVE) IMPLANT
GLOVE BIOGEL PI INDICATOR 7.0 (GLOVE) ×1
GLOVE BIOGEL PI INDICATOR 7.5 (GLOVE) ×1
GLOVE SURG SS PI 7.0 STRL IVOR (GLOVE) ×2 IMPLANT
GOWN STRL REUS W/ TWL LRG LVL3 (GOWN DISPOSABLE) ×3 IMPLANT
GOWN STRL REUS W/TWL LRG LVL3 (GOWN DISPOSABLE) ×8
GRASPER SUT TROCAR 14GX15 (MISCELLANEOUS) ×2 IMPLANT
HEMOSTAT SNOW SURGICEL 2X4 (HEMOSTASIS) ×1 IMPLANT
KIT BASIN OR (CUSTOM PROCEDURE TRAY) ×2 IMPLANT
KIT TURNOVER KIT B (KITS) ×2 IMPLANT
NEEDLE 22X1 1/2 (OR ONLY) (NEEDLE) ×2 IMPLANT
NS IRRIG 1000ML POUR BTL (IV SOLUTION) ×2 IMPLANT
PAD ARMBOARD 7.5X6 YLW CONV (MISCELLANEOUS) ×2 IMPLANT
POUCH RETRIEVAL ECOSAC 10 (ENDOMECHANICALS) ×1 IMPLANT
POUCH RETRIEVAL ECOSAC 10MM (ENDOMECHANICALS) ×2
SCISSORS LAP 5X35 DISP (ENDOMECHANICALS) ×2 IMPLANT
SET IRRIG TUBING LAPAROSCOPIC (IRRIGATION / IRRIGATOR) ×2 IMPLANT
SET TUBE SMOKE EVAC HIGH FLOW (TUBING) ×2 IMPLANT
SLEEVE ENDOPATH XCEL 5M (ENDOMECHANICALS) ×4 IMPLANT
SPECIMEN JAR SMALL (MISCELLANEOUS) ×2 IMPLANT
SUT ETHILON 2 0 FS 18 (SUTURE) ×1 IMPLANT
SUT MNCRL AB 4-0 PS2 18 (SUTURE) ×2 IMPLANT
SUT VICRYL 0 UR6 27IN ABS (SUTURE) ×4 IMPLANT
TOWEL GREEN STERILE (TOWEL DISPOSABLE) ×2 IMPLANT
TOWEL GREEN STERILE FF (TOWEL DISPOSABLE) ×2 IMPLANT
TRAY LAPAROSCOPIC MC (CUSTOM PROCEDURE TRAY) ×2 IMPLANT
TROCAR XCEL 12X100 BLDLESS (ENDOMECHANICALS) ×2 IMPLANT
TROCAR XCEL NON-BLD 5MMX100MML (ENDOMECHANICALS) ×2 IMPLANT

## 2022-05-09 NOTE — Progress Notes (Signed)
Pre Procedure note for inpatients:   John Mack has been scheduled for laparoscopic cholecystectomy today. The various methods of treatment have been discussed with the patient. After consideration of the risks, benefits and treatment options the patient has consented to the planned procedure.   The patient has been seen and labs reviewed. There are no changes in the patient's condition to prevent proceeding with the planned procedure today.  Ultrasound yesterday shows no stones. Persistent leukocytosis and pain.  Recent labs:  Lab Results  Component Value Date   WBC 13.2 (H) 05/09/2022   HGB 12.3 (L) 05/09/2022   HCT 36.2 (L) 05/09/2022   PLT 195 05/09/2022   GLUCOSE 116 (H) 05/09/2022   CHOL  04/08/2009    174        ATP III CLASSIFICATION:  <200     mg/dL   Desirable  200-239  mg/dL   Borderline High  >=240    mg/dL   High          TRIG 96 04/08/2009   HDL 30 (L) 04/08/2009   LDLCALC (H) 04/08/2009    125        Total Cholesterol/HDL:CHD Risk Coronary Heart Disease Risk Table                     Men   Women  1/2 Average Risk   3.4   3.3  Average Risk       5.0   4.4  2 X Average Risk   9.6   7.1  3 X Average Risk  23.4   11.0        Use the calculated Patient Ratio above and the CHD Risk Table to determine the patient's CHD Risk.        ATP III CLASSIFICATION (LDL):  <100     mg/dL   Optimal  100-129  mg/dL   Near or Above                    Optimal  130-159  mg/dL   Borderline  160-189  mg/dL   High  >190     mg/dL   Very High   ALT 13 05/09/2022   AST 21 05/09/2022   NA 131 (L) 05/09/2022   K 3.3 (L) 05/09/2022   CL 94 (L) 05/09/2022   CREATININE 1.27 (H) 05/09/2022   BUN 16 05/09/2022   CO2 25 05/09/2022   TSH 7.869 (H) 05/07/2022   INR 1.2 05/07/2022   HGBA1C  04/08/2009    5.6 (NOTE) The ADA recommends the following therapeutic goal for glycemic control related to Hgb A1c measurement: Goal of therapy: <6.5 Hgb A1c  Reference: American Diabetes  Association: Clinical Practice Recommendations 2010, Diabetes Care, 2010, 33: (Suppl  1).    Mickeal Skinner, MD 05/09/2022 8:10 AM

## 2022-05-09 NOTE — Anesthesia Procedure Notes (Signed)
Procedure Name: Intubation Date/Time: 05/09/2022 10:56 AM Performed by: Colin Benton, CRNA Pre-anesthesia Checklist: Patient identified, Emergency Drugs available, Suction available and Patient being monitored Patient Re-evaluated:Patient Re-evaluated prior to induction Oxygen Delivery Method: Circle system utilized Preoxygenation: Pre-oxygenation with 100% oxygen Induction Type: IV induction Ventilation: Mask ventilation without difficulty and Oral airway inserted - appropriate to patient size Laryngoscope Size: Glidescope and 4 Grade View: Grade I Tube type: Oral Tube size: 7.5 mm Number of attempts: 1 Airway Equipment and Method: Oral airway, Rigid stylet and Video-laryngoscopy Placement Confirmation: ETT inserted through vocal cords under direct vision, positive ETCO2 and breath sounds checked- equal and bilateral Secured at: 23 cm Tube secured with: Tape Dental Injury: Teeth and Oropharynx as per pre-operative assessment

## 2022-05-09 NOTE — Transfer of Care (Signed)
Immediate Anesthesia Transfer of Care Note  Patient: John Mack  Procedure(s) Performed: LAPAROSCOPIC CHOLECYSTECTOMY WITH ICG DYE (Abdomen)  Patient Location: PACU  Anesthesia Type:General  Level of Consciousness: drowsy  Airway & Oxygen Therapy: Patient Spontanous Breathing and Patient connected to face mask oxygen  Post-op Assessment: Report given to RN and Post -op Vital signs reviewed and stable  Post vital signs: Reviewed and stable  Last Vitals:  Vitals Value Taken Time  BP 150/77 05/09/22 1230  Temp    Pulse 101 05/09/22 1232  Resp 23 05/09/22 1234  SpO2 95 % 05/09/22 1235  Vitals shown include unvalidated device data.  Last Pain:  Vitals:   05/09/22 1033  TempSrc:   PainSc: 5       Patients Stated Pain Goal: 3 (53/97/67 3419)  Complications: No notable events documented.

## 2022-05-09 NOTE — Progress Notes (Signed)
Progress Note   Patient: John Mack DJS:[id number] DOB: 15-Sep-1941 DOA: 05/07/2022     2 DOS: the patient was seen and examined on 05/09/2022   Brief hospital course: 61yom PMH traumatic SDH, pulmonary embolism, systolic CHF presented with epigastric abdominal pain and right upper quadrant pain with radiation to his chest. Admitted for sepsis secondary to acute cholecystitis. S/p lap chole and drain placement 5/30. Home when cleared by surgery.  Assessment and Plan: * Severe sepsis ([place]) secondary to acute acalculus cholecystitis-resolved as of 05/09/2022 -- With tachypnea, leukocytosis, lactic acidosis on admission.  Overall stable hemodynamically.  WBC decreased, afebrile. --sepsis symptomatology resolved.  Acute cholecystitis --s/p lap chole with drain placement 5/30. Gangrenous and necrotic gallbladder. --continue abx  Elevated serum creatinine --etiology unclear, recheck CMP in AM  Chronic systolic CHF (congestive heart failure) ([place]) --Followed by [facility] cardiology, last seen November 2022 at which time amiodarone was continued to suppress PVCs and was noted to have low normal LVEF of 50%. --echo here shows LVEF 60 to 65%, normal function, no regional wall motion abnormalities, grade 1 diastolic dysfunction.  RV systolic function normal.  No significant valvular abnormalities noted. --No signs or symptoms of volume overload and not on any diuretics. --stable, continue metoprolol  Frequent PVCs --PMH 31% burden by holter, leading to start on amiodarone and beta blocker with near complete suppression (burden <1%)  --telemetry SR, EKG SR, no acute changes --Continue amiodarone, Toprol-XL  Hypokalemia --replete  Bilateral pulmonary embolism (HCC) --Hx of provoked PE during hospitalization for SDH in 02/2021 due to prolonged immobilization. Repeat CTA of chest in 5/22 showed emboli had dissolved Recommended AC for 3-6 months; can probably stop on discharge. --Hold apixaban in  setting of surgery   Essential hypertension --stable, will hold Norvasc, HCTZ  Eosinophilic esophagitis -  --Continue PPI BID   Hypothyroidism --Continue levothyroxine; TSH high but refused increased dose in past  Asthma, chronic --stable, followed by [facility] pulmonology --Continue inhalers: Symbicort, Spiriva and SABA prn   Anxiety --continue '2mg'$  xanax at bedtime, PRN Ativan as at home         Subjective:  Feels ok s/p surgery  Physical Exam: Vitals:   05/09/22 1315 05/09/22 1330 05/09/22 1345 05/09/22 1437  BP: (!) 144/79 140/80 131/77 136/80  Pulse: 77 76 72 76  Resp: '18 20 19 20  '$ Temp:    97.7 F (36.5 C)  TempSrc:    Oral  SpO2: 94% 97% 93% 95%  Weight:      Height:       Physical Exam Vitals reviewed.  Constitutional:      General: He is not in acute distress.    Appearance: He is not ill-appearing or toxic-appearing.  Cardiovascular:     Rate and Rhythm: Normal rate and regular rhythm.     Heart sounds: No murmur heard. Pulmonary:     Effort: Pulmonary effort is normal. No respiratory distress.     Breath sounds: No wheezing, rhonchi or rales.  Neurological:     Mental Status: He is alert.  Psychiatric:        Mood and Affect: Mood normal.        Behavior: Behavior normal.    Data Reviewed:  K+ 3.3 Creatinine up to 1.27 WBC down to 13.2 Hgb stable 12.3  Family Communication: none  Disposition: Status is: Inpatient Remains inpatient appropriate because: s/p cholecystectomy for gangrenous gallbladder, drain placed   Planned Discharge Destination: Home HHPT    Time spent: 25 minutes  Author: Murray Hodgkins, MD 05/09/2022 6:20 PM  For on call review [email or web address].

## 2022-05-09 NOTE — Progress Notes (Signed)
Physical Therapy Treatment Patient Details Name: John Mack MRN: [id number] DOB: 1941-09-02 Today's Date: 05/09/2022   History of Present Illness 81 yo male admitted acute cholecystitis pending surg 5/30.  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    PT Comments    Pt was seen for presurgical walk, with some fatigue and weakness in LE's impacting his quality of gait.  Recommending him to get moving to go home, and will reassess him post surgery for his mobility quality.  Pt was not a high priority to see today but has a cough and PT determined a walk would be beneficial before having anesthesia.  Follow up with him to see how he moves post operatively, and may have mobility techs see as well.  HHPT to see at dc for further strengthening as needed.   Recommendations for follow up therapy are one component of a multi-disciplinary discharge planning process, led by the attending physician.  Recommendations may be updated based on patient status, additional functional criteria and insurance authorization.  Follow Up Recommendations  Home health PT     Assistance Recommended at Discharge Intermittent Supervision/Assistance  Patient can return home with the following A little help with walking and/or transfers;A little help with bathing/dressing/bathroom;Assistance with cooking/housework;Assist for transportation;Help with stairs or ramp for entrance   Equipment Recommendations  Rolling walker (2 wheels);BSC/3in1    Recommendations for Other Services       Precautions / Restrictions Precautions Precautions: Fall Precaution Comments: recent falls Restrictions Weight Bearing Restrictions: No     Mobility  Bed Mobility Overal bed mobility: Needs Assistance Bed Mobility: Supine to Sit, Sit to Supine     Supine to sit: Min guard Sit to supine: Min guard   General bed  mobility comments: assisted with shoes and covering    Transfers Overall transfer level: Needs assistance Equipment used: Rolling walker (2 wheels) Transfers: Sit to/from Stand Sit to Stand: Min assist           General transfer comment: min assist to power up but is a bit lethargic    Ambulation/Gait Ambulation/Gait assistance: Min guard Gait Distance (Feet): 90 Feet Assistive device: Rolling walker (2 wheels) Gait Pattern/deviations: Step-through pattern, Decreased stride length, Shuffle Gait velocity: reduced Gait velocity interpretation: <1.31 ft/sec, indicative of household ambulator   General Gait Details: pt is weaker on L side and using RW for management of foot clearance, as he regularly shuffles and needs a minor support of walker to steady himself   Chief Strategy Officer    Modified Rankin (Stroke Patients Only)       Balance Overall balance assessment: Needs assistance Sitting-balance support: No upper extremity supported Sitting balance-[name]: Fair     Standing balance support: Bilateral upper extremity supported, During functional activity Standing balance-[name]: Poor Standing balance comment: requires walker support                            Cognition Arousal/Alertness: Awake/alert Behavior During Therapy: WFL for tasks assessed/performed Overall Cognitive Status: Impaired/Different from baseline Area of Impairment: Safety/judgement, Awareness, Problem solving                         Safety/Judgement: Decreased awareness of deficits, Decreased awareness of safety Awareness: Intellectual Problem  Solving: Requires verbal cues, Requires tactile cues General Comments: pt is somewhat overly optimistic about his tolerance to move        Exercises      General Comments General comments (skin integrity, edema, etc.): pt was assisted on RW with slow pace and mild unsteadiness.  He is having  a bit of issue with determining limits, and is very appropriate for home with family assist.  Having surgery today and will need to redefine his needs for follow up care afterward      Pertinent Vitals/Pain Pain Assessment Pain Assessment: Faces Faces Pain Scale: Hurts a little bit Pain Location: R flank pain    Home Living                          Prior Function            PT Goals (current goals can now be found in the care plan section) Acute Rehab PT Goals Patient Stated Goal: to get home soon Progress towards PT goals: Progressing toward goals    Frequency    Min 3X/week      PT Plan Current plan remains appropriate    Co-evaluation              AM-PAC PT "6 Clicks" Mobility   Outcome Measure  Help needed turning from your back to your side while in a flat bed without using bedrails?: None Help needed moving from lying on your back to sitting on the side of a flat bed without using bedrails?: A Little Help needed moving to and from a bed to a chair (including a wheelchair)?: A Little Help needed standing up from a chair using your arms (e.g., wheelchair or bedside chair)?: A Little Help needed to walk in hospital room?: A Little Help needed climbing 3-5 steps with a railing? : A Little 6 Click Score: 19    End of Session Equipment Utilized During Treatment: Gait belt Activity Tolerance: Patient limited by fatigue Patient left: in bed;with call bell/phone within reach;with bed alarm set Nurse Communication: Mobility status PT Visit Diagnosis: Unsteadiness on feet (R26.81);Muscle weakness (generalized) (M62.81);Repeated falls (R29.6)     Time: 8786-7672 PT Time Calculation (min) (ACUTE ONLY): 18 min  Charges:  $Gait Training: 8-22 mins Ramond Dial 05/09/2022, 11:46 AM  Mee Hives, PT PhD Acute Rehab Dept. Number: [place] [facility] [place]

## 2022-05-09 NOTE — Anesthesia Postprocedure Evaluation (Signed)
Anesthesia Post Note  Patient: John Mack  Procedure(s) Performed: LAPAROSCOPIC CHOLECYSTECTOMY WITH ICG DYE (Abdomen)     Patient location during evaluation: PACU Anesthesia Type: General Level of consciousness: awake and alert, oriented and patient cooperative Pain management: pain level controlled Vital Signs Assessment: post-procedure vital signs reviewed and stable Respiratory status: spontaneous breathing, nonlabored ventilation and respiratory function stable Cardiovascular status: blood pressure returned to baseline and stable Postop Assessment: no apparent nausea or vomiting Anesthetic complications: no   No notable events documented.  Last Vitals:  Vitals:   05/09/22 1330 05/09/22 1345  BP: 140/80 131/77  Pulse: 76 72  Resp: 20 19  Temp:    SpO2: 97% 93%    Last Pain:  Vitals:   05/09/22 1345  TempSrc:   PainSc: [place]

## 2022-05-09 NOTE — Op Note (Signed)
PATIENT:  John Mack  81 y.o. male  PRE-OPERATIVE DIAGNOSIS:  cholecystitis  POST-OPERATIVE DIAGNOSIS:  cholecystitis  PROCEDURE:  Procedure(s): LAPAROSCOPIC CHOLECYSTECTOMY WITH ICG DYE   SURGEON:  John Mack, Arta Bruce, MD   ASSISTANT: Terrill Mohr, MD  ANESTHESIA:   local and general  Indications for procedure: John Mack is a 81 y.o. male with symptoms of Abdominal pain and Nausea and vomiting consistent with gallbladder disease, Confirmed by ultrasound and CT.  Description of procedure: The patient was brought into the operative suite, placed supine. Anesthesia was administered with endotracheal tube. Patient was strapped in place and foot board was secured. All pressure points were offloaded by foam padding. The patient was prepped and draped in the usual sterile fashion.  A periumbilical incision was made and optical entry was used to enter the abdomen. 2 5 mm trocars were placed on in the right lateral space on in the right subcostal space. A 45m trocar was placed in the subxiphoid space. Marcaine with Epinephrine was infused to the subxiphoid space and lateral upper right abdomen in the transversus abdominis plane. Next the patient was placed in reverse trendelenberg. The gallbladder appearedacutely inflamed, gangrenous, and necrotic. Omentum was adhered to the gallbladder and was taken down with cautery/blunt dissection and Due to the level of dilation, the gallbladder was evacuated with suction.  The gallbladder was retracted cephalad and lateral. The peritoneum was reflected off the infundibulum working lateral to medial. The cystic duct and cystic artery were identified and further dissection revealed a critical view. The cystic duct appeared shortened and the infundibulum was divided and an endoloop placed across the proximal cystic duct. The cystic artery was doubly clipped and ligated.   The gallbladder was removed off the liver bed with cautery. The  Gallbladder was placed in a specimen bag. The gallbladder fossa was irrigated and hemostasis was applied with cautery. The gallbladder was removed via the 137mtrocar. SnEmogene Morganas placed into the bed for hemostasis.A 19 fr blake drain was placed with the tip in the gallbladder fossa and brought out the right lateral port site and sutured in place with a 2-0 Nylon and the fascial defect was closed with figure of 8 0 vicryl . Pneumoperitoneum was removed, all trocar were removed. All incisions were closed with 4-0 monocryl subcuticular stitch. The patient woke from anesthesia and was brought to PACU in stable condition. All counts were correct  Findings: gangrenous gallbladder  Specimen: gallbladder  Blood loss: 50 ml  Local anesthesia: 30 ml Marcaine with Epinephrine  Complications: none  PLAN OF CARE: Admit to inpatient   PATIENT DISPOSITION:  PACU - hemodynamically stable.  [facility], [place]

## 2022-05-09 NOTE — Progress Notes (Signed)
CSW spoke with patient and his daughter John Mack to discuss plan of care for his wife who is sitting in the ED. CSW explained to patient that his wife cannot stay in the ED until he goes home after surgery. Patient agreed and gave permission for his daughter John Mack to arrange a caregiver to come into the home and care for his wife while he is in the hospital. CSW told patient that [facility] charges 25 dollars a hour. Patient was fine and told CSW to arrange everything with his daughter John Mack. CSW told John Mack she would update her so she can arrange for the caregiver to come into the home. Patient refused for his wife to go to a facility.

## 2022-05-09 NOTE — Assessment & Plan Note (Addendum)
Improved, follow

## 2022-05-09 NOTE — Anesthesia Preprocedure Evaluation (Addendum)
Anesthesia Evaluation  Patient identified by MRN, date of birth, ID band Patient awake    Reviewed: Allergy & Precautions, NPO status , Patient's Chart, lab work & pertinent test results, reviewed documented beta blocker date and time   Airway Mallampati: III  TM Distance: >3 FB Neck ROM: Full    Dental  (+) Poor Dentition, Dental Advisory Given, Missing   Pulmonary asthma , PE (eliquis) Hx provoked PE in setting of SDH 02/2021   Pulmonary exam normal breath sounds clear to auscultation       Cardiovascular hypertension, Pt. on medications and Pt. 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 Left ventricular ejection fraction, by estimation, is 60 to 65%. The  left ventricle has normal function. The left ventricle has no regional  wall motion abnormalities. There is mild left ventricular hypertrophy.  Left ventricular diastolic parameters  are consistent with Grade I diastolic dysfunction (impaired relaxation).  2. Right ventricular systolic function is normal. The right ventricular  size is normal. There is normal pulmonary artery systolic pressure.  3. The mitral valve is normal in structure. No evidence of mitral valve  regurgitation. No evidence of mitral stenosis.  4. The aortic valve is tricuspid. Aortic valve regurgitation is trivial.  Aortic valve sclerosis/calcification is present, without any evidence of  aortic stenosis.  5. Aortic dilatation noted. There is borderline dilatation of the aortic  root, measuring 39 mm.   According to patient, runs multiple miles per day- overall looks very deconditioned, has a cough in preop that sounds productive but is unable to bring up any sputum   Neuro/Psych  Headaches, PSYCHIATRIC DISORDERS Anxiety  Depression CVA, No Residual Symptoms    GI/Hepatic Neg liver ROS, GERD  Controlled,Severe sepsis (HCC) secondary to acute acalculus cholecystitis -- With tachypnea and leukocytosis on admission and lactic acidosis.  Overall stable hemodynamically.  WBC without significant change, still elevated.  Low-grade temperature noted, febrile on admission.     Endo/Other  Hypothyroidism   Renal/GU Renal InsufficiencyRenal diseaseCr 1.27  negative genitourinary   Musculoskeletal  (+) Arthritis , Osteoarthritis,    Abdominal   Peds  Hematology negative hematology ROS (+) Hb 12.3, plt 195   Anesthesia Other Findings   Reproductive/Obstetrics negative OB ROS                            Anesthesia Physical Anesthesia Plan  ASA: 3  Anesthesia Plan: General   Post-op Pain Management:    Induction: Intravenous  PONV Risk Score and Plan: 3 and Ondansetron, Dexamethasone, Midazolam and Treatment may vary due to age or medical condition  Airway Management Planned: Oral ETT  Additional Equipment: None  Intra-op Plan:   Post-operative Plan: Extubation in OR  Informed Consent: I have reviewed the patients History and Physical, chart, labs and discussed the procedure including the risks, benefits and alternatives for the proposed anesthesia with the patient or authorized representative who has indicated his/her understanding and acceptance.     Dental advisory given  Plan Discussed with: CRNA  Anesthesia Plan Comments:        Anesthesia Quick Evaluation

## 2022-05-10 ENCOUNTER — Encounter (HOSPITAL_COMMUNITY): Payer: Self-pay | Admitting: General Surgery

## 2022-05-10 ENCOUNTER — Other Ambulatory Visit (HOSPITAL_COMMUNITY): Payer: Self-pay

## 2022-05-10 DIAGNOSIS — R6520 Severe sepsis without septic shock: Secondary | ICD-10-CM | POA: Diagnosis not present

## 2022-05-10 DIAGNOSIS — A419 Sepsis, unspecified organism: Secondary | ICD-10-CM | POA: Diagnosis not present

## 2022-05-10 LAB — COMPREHENSIVE METABOLIC PANEL
ALT: 21 U/L (ref 0–44)
AST: 41 U/L (ref 15–41)
Albumin: 2.5 g/dL — ABNORMAL LOW (ref 3.5–5.0)
Alkaline Phosphatase: 29 U/L — ABNORMAL LOW (ref 38–126)
Anion gap: 8 (ref 5–15)
BUN: 14 mg/dL (ref 8–23)
CO2: 26 mmol/L (ref 22–32)
Calcium: 7.8 mg/dL — ABNORMAL LOW (ref 8.9–10.3)
Chloride: 97 mmol/L — ABNORMAL LOW (ref 98–111)
Creatinine, Ser: 1.02 mg/dL (ref 0.61–1.24)
GFR, Estimated: >60 mL/min (ref >60)
Glucose, Bld: 205 mg/dL — ABNORMAL HIGH (ref 70–99)
Potassium: 3.6 mmol/L (ref 3.5–5.1)
Sodium: 131 mmol/L — ABNORMAL LOW (ref 135–145)
Total Bilirubin: 0.7 mg/dL (ref 0.3–1.2)
Total Protein: 5.7 g/dL — ABNORMAL LOW (ref 6.5–8.1)

## 2022-05-10 LAB — SURGICAL PATHOLOGY

## 2022-05-10 MED ORDER — OXYCODONE HCL 5 MG PO TABS
5.0000 mg | ORAL_TABLET | Freq: Four times a day (QID) | ORAL | 0 refills | Status: AC | PRN
  Filled 2022-05-10: qty 10, 3d supply, fill #0

## 2022-05-10 NOTE — Progress Notes (Signed)
1 Day Post-Op   Chief Complaint/Subjective: Pain moderate over drain  Objective: Vital signs in last 24 hours: Temp:  [97.7 F (36.5 C)-99.4 F (37.4 C)] 98.5 F (36.9 C) (05/31 0352) Pulse Rate:  [66-91] 66 (05/31 0352) Resp:  [16-20] 16 (05/31 0352) BP: (107-156)/(64-86) 107/64 (05/31 0352) SpO2:  [91 %-100 %] 96 % (05/31 0821) Weight:  [90.7 kg] 90.7 kg (05/30 1017) Last BM Date : 05/08/22 Intake/Output from previous day: 05/30 0701 - 05/31 0700 In: 1000 [P.O.:300; I.V.:700] Out: 2370 [Urine:2250; Drains:95; Blood:25] Intake/Output this shift: No intake/output data recorded.  PE: Gen: NAD Resp: nonlabored Card: RRR Abd: soft, incisions well healed, drain serosanguinous  Lab Results:  Recent Labs    05/08/22 0104 05/09/22 0131  WBC 17.0* 13.2*  HGB 12.4* 12.3*  HCT 35.7* 36.2*  PLT 196 195   BMET Recent Labs    05/09/22 0131 05/10/22 0202  NA 131* 131*  K 3.3* 3.6  CL 94* 97*  CO2 25 26  GLUCOSE 116* 205*  BUN 16 14  CREATININE 1.27* 1.02  CALCIUM 8.2* 7.8*   PT/INR No results for input(s): LABPROT, INR in the last 72 hours. CMP     Component Value Date/Time   NA 131 (L) 05/10/2022 0202   NA 136 03/04/2021 1036   K 3.6 05/10/2022 0202   CL 97 (L) 05/10/2022 0202   CO2 26 05/10/2022 0202   GLUCOSE 205 (H) 05/10/2022 0202   BUN 14 05/10/2022 0202   BUN 10 03/04/2021 1036   CREATININE 1.02 05/10/2022 0202   CALCIUM 7.8 (L) 05/10/2022 0202   PROT 5.7 (L) 05/10/2022 0202   ALBUMIN 2.5 (L) 05/10/2022 0202   AST 41 05/10/2022 0202   ALT 21 05/10/2022 0202   ALKPHOS 29 (L) 05/10/2022 0202   BILITOT 0.7 05/10/2022 0202   GFRNONAA >60 05/10/2022 0202   GFRAA  04/06/2009 1522    >60        The eGFR has been calculated using the MDRD equation. This calculation has not been validated in all clinical situations. eGFR's persistently <60 mL/min signify possible Chronic Kidney Disease.   Lipase  No results found for:  LIPASE  Studies/Results: ECHOCARDIOGRAM COMPLETE  Result Date: 05/08/2022    ECHOCARDIOGRAM REPORT   Patient Name:   John Mack Date of Exam: 05/08/2022 Medical Rec #:  [id number]        Height:       70.0 in Accession #:    [id number]       Weight:       200.0 lb Date of Birth:  [birth year]       BSA:          2.087 m Patient Age:    81 years         BP:           115/65 mmHg Patient Gender: M                HR:           85 bpm. Exam Location:  Inpatient Procedure: 2D Echo, Cardiac Doppler and Color Doppler Indications:    CHF-Acute Systolic G88.11  History:        Patient has prior history of Echocardiogram examinations, most                 recent 02/10/2021. TIA, Arrythmias:PVC; Risk Factors:Hypertension                 and Dyslipidemia. Past  history of pulmonary embolism and DVT.  Sonographer:    Darlina Sicilian RDCS Referring Phys: [id number] [place]  1. Left ventricular ejection fraction, by estimation, is 60 to 65%. The left ventricle has normal function. The left ventricle has no regional wall motion abnormalities. There is mild left ventricular hypertrophy. Left ventricular diastolic parameters are consistent with Grade I diastolic dysfunction (impaired relaxation).  2. Right ventricular systolic function is normal. The right ventricular size is normal. There is normal pulmonary artery systolic pressure.  3. The mitral valve is normal in structure. No evidence of mitral valve regurgitation. No evidence of mitral stenosis.  4. The aortic valve is tricuspid. Aortic valve regurgitation is trivial. Aortic valve sclerosis/calcification is present, without any evidence of aortic stenosis.  5. Aortic dilatation noted. There is borderline dilatation of the aortic root, measuring 39 mm. FINDINGS  Left Ventricle: Left ventricular ejection fraction, by estimation, is 60 to 65%. The left ventricle has normal function. The left ventricle has no regional wall motion abnormalities. The left  ventricular internal cavity size was normal in size. There is  mild left ventricular hypertrophy. Left ventricular diastolic parameters are consistent with Grade I diastolic dysfunction (impaired relaxation). Right Ventricle: The right ventricular size is normal. Right ventricular systolic function is normal. There is normal pulmonary artery systolic pressure. The tricuspid regurgitant velocity is 2.66 m/s, and with an assumed right atrial pressure of 3 mmHg,  the estimated right ventricular systolic pressure is 27.5 mmHg. Left Atrium: Left atrial size was normal in size. Right Atrium: Right atrial size was normal in size. Pericardium: There is no evidence of pericardial effusion. Mitral Valve: The mitral valve is normal in structure. No evidence of mitral valve regurgitation. No evidence of mitral valve stenosis. Tricuspid Valve: The tricuspid valve is normal in structure. Tricuspid valve regurgitation is mild . No evidence of tricuspid stenosis. Aortic Valve: The aortic valve is tricuspid. Aortic valve regurgitation is trivial. Aortic valve sclerosis/calcification is present, without any evidence of aortic stenosis. Pulmonic Valve: The pulmonic valve was normal in structure. Pulmonic valve regurgitation is trivial. No evidence of pulmonic stenosis. Aorta: Aortic dilatation noted. There is borderline dilatation of the aortic root, measuring 39 mm. Venous: The inferior vena cava was not well visualized. IAS/Shunts: No atrial level shunt detected by color flow Doppler.  LEFT VENTRICLE PLAX 2D LVIDd:         5.00 cm   Diastology LVIDs:         3.10 cm   LV e' medial:    5.03 cm/s LV PW:         1.15 cm   LV E/e' medial:  12.0 LV IVS:        1.45 cm   LV e' lateral:   4.22 cm/s LVOT diam:     2.20 cm   LV E/e' lateral: 14.3 LV SV:         35 LV SV Index:   17 LVOT Area:     3.80 cm  RIGHT VENTRICLE RV S prime:     17.30 cm/s TAPSE (M-mode): 1.6 cm LEFT ATRIUM           Index        RIGHT ATRIUM          Index LA diam:       4.10 cm 1.96 cm/m   RA Area:     8.91 cm LA Vol (A2C): 35.7 ml 17.10 ml/m  RA Volume:   12.00 ml 5.75  ml/m LA Vol (A4C): 46.0 ml 22.04 ml/m  AORTIC VALVE LVOT Vmax:   56.00 cm/s LVOT Vmean:  37.600 cm/s LVOT VTI:    0.092 m  AORTA Ao Root diam: 3.90 cm Ao Asc diam:  3.40 cm MITRAL VALVE               TRICUSPID VALVE MV Area (PHT): 9.85 cm    TR Peak grad:   28.3 mmHg MV Decel Time: 77 msec     TR Vmax:        266.00 cm/s MV E velocity: 60.40 cm/s MV A velocity: 64.40 cm/s  SHUNTS MV E/A ratio:  0.94        Systemic VTI:  0.09 m                            Systemic Diam: 2.20 cm Kirk Ruths MD Electronically signed by Kirk Ruths MD Signature Date/Time: 05/08/2022/10:46:04 AM    Final    US Abdomen Limited RUQ (LIVER/GB)  Result Date: 05/08/2022 CLINICAL DATA:  Right upper quadrant pain. EXAM: ULTRASOUND ABDOMEN LIMITED RIGHT UPPER QUADRANT COMPARISON:  CT examination dated 05/07/2022 difference FINDINGS: Gallbladder: Marked gallbladder wall thickening and pericholecystic fluid without appreciable gallstones. No sonographic Murphy sign noted by sonographer. Common bile duct: Diameter: Normal measuring up to 3 mm Liver: No focal lesion identified. Within normal limits in parenchymal echogenicity. Portal vein is patent on color Doppler imaging with normal direction of blood flow towards the liver. Other: None. IMPRESSION: 1. Marked gallbladder wall thickening with pericholecystic fluid without evidence of gallstones suggesting acalculous cholecystitis. Clinical correlation is suggested. 2. No intrahepatic biliary ductal dilatation. Liver is unremarkable. Electronically Signed   By: Keane Police D.O.   On: 05/08/2022 11:13    Anti-infectives: Anti-infectives (From admission, onward)    Start     Dose/Rate Route Frequency Ordered Stop   05/07/22 1415  piperacillin-tazobactam (ZOSYN) IVPB 3.375 g        3.375 g 12.5 mL/hr over 240 Minutes Intravenous Every 8 hours 05/07/22 1408     05/07/22  1315  cefTRIAXone (ROCEPHIN) 2 g in sodium chloride 0.9 % 100 mL IVPB        2 g 200 mL/hr over 30 Minutes Intravenous  Once 05/07/22 1313 05/07/22 1459       Assessment/Plan  s/p Procedure(s): LAPAROSCOPIC CHOLECYSTECTOMY WITH ICG DYE 05/09/2022    FEN - heart healthy diet VTE - heparin sq, restart eliquis tonight ID - [email or web address] 5/30-5/31 completed Disposition - home today, drain education   LOS: 3 days   I reviewed last 24 h vitals and pain scores, last 48 h intake and output, last 24 h labs and trends, and last 24 h imaging results.  This care required moderate level of medical decision making.   [facility] 05/10/2022, 8:23 AM Please see Amion for pager number during day hours 7:00am-4:30pm or 7:00am -11:30am on weekends

## 2022-05-10 NOTE — Progress Notes (Signed)
PT Cancellation Note  Patient Details Name: John Mack MRN: [id number] DOB: Feb 27, 1941   Cancelled Treatment:    Reason Eval/Treat Not Completed: Patient at procedure or test/unavailable. Attempted 2x, but pt still with other personnel reviewing documents. Will plan to follow-up shortly again or around lunch time as able.   Moishe Spice, PT, DPT Acute Rehabilitation Services  Pager: [phone] Office: [place] 05/10/2022, 9:36 AM

## 2022-05-10 NOTE — Progress Notes (Signed)
Discharge instructions given to patient, patient verbalizes understanding. Discharge medications given to the patient. Drain training reviewed. Iv removed, belongings gathered. Patient discharged

## 2022-05-10 NOTE — Evaluation (Signed)
Physical Therapy Re-Evaluation Patient Details Name: John Mack MRN: [id number] DOB: May 16, 1941 Today's Date: 05/10/2022  History of Present Illness  81 yo male admitted acute cholecystitis pending surg 5/30. S/p lap chole and drain placement 5/30. 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   Clinical Impression  Pt now s/p lap chole and drain placement 5/30. Upon coming to stand for the first time, likely today, he did have x1 posterior LOB needing minA to recover. Pt also needed light minA to scoot to EOB once sitting up due to a coughing spell. While pt appeared to have labored wheezy breathing, his SpO2 remained >/= 96% on RA and improved when standing/ambulating compared to supine/sitting. Pt only required min guard assist for safety with subsequent transfer reps and gait bouts using the RW, continuing to display a shuffling gait pattern. He is also mildly impulsive with poor safety awareness and potential memory deficits. However, it is unclear if this is pt's baseline or potentially due to him appearing to be set in his ways on things at times. Pt reports there will be nursing at the home in the mornings and evenings. Will continue to recommend HHPT with family/RN support as pt is at risk for falls. Will continue to follow acutely.     Recommendations for follow up therapy are one component of a multi-disciplinary discharge planning process, led by the attending physician.  Recommendations may be updated based on patient status, additional functional criteria and insurance authorization.  Follow Up Recommendations Home health PT    Assistance Recommended at Discharge Intermittent Supervision/Assistance  Patient can return home with the following  A little help with walking and/or transfers;A little help with bathing/dressing/bathroom;Assistance with cooking/housework;Assist for  transportation;Help with stairs or ramp for entrance    Equipment Recommendations Rolling walker (2 wheels);BSC/3in1  Recommendations for Other Services       Functional Status Assessment Patient has had a recent decline in their functional status and demonstrates the ability to make significant improvements in function in a reasonable and predictable amount of time.     Precautions / Restrictions Precautions Precautions: Fall Precaution Comments: recent falls, R abdomen JP drain Restrictions Weight Bearing Restrictions: No      Mobility  Bed Mobility Overal bed mobility: Needs Assistance Bed Mobility: Supine to Sit     Supine to sit: Min assist, HOB elevated     General bed mobility comments: Pt coming to sit initially with SUP with HOB elevated but then he began to cough and get red in the face, asking PT to assist him to scoot to the edge.    Transfers Overall transfer level: Needs assistance Equipment used: Rolling walker (2 wheels) Transfers: Sit to/from Stand Sit to Stand: Min assist, Min guard           General transfer comment: Pt with LOB first time coming to stand, needing minA to prevent full posterior LOB. x6 additional reps with pt only needing min guard for safety, no further LOB bouts.    Ambulation/Gait Ambulation/Gait assistance: Min guard Gait Distance (Feet): 250 Feet (x2 bouts of ~250 ft each) Assistive device: Rolling walker (2 wheels) Gait Pattern/deviations: Step-through pattern, Decreased stride length, Shuffle, Drifts right/left (drifts L) Gait velocity: reduced Gait velocity interpretation: <1.31 ft/sec, indicative of household ambulator   General Gait Details: Pt tends to drift to L and shuffle his feet, needing cues to improve feet clearance  repeatedly. Min guard for safety and cues to guide RW in tight spaces around obstacles, no LOB.  Stairs            Wheelchair Mobility    Modified Rankin (Stroke Patients Only)        Balance Overall balance assessment: Needs assistance Sitting-balance support: No upper extremity supported Sitting balance-[name]: Fair     Standing balance support: Bilateral upper extremity supported, During functional activity Standing balance-[name]: Poor Standing balance comment: requires walker support                             Pertinent Vitals/Pain Pain Assessment Pain Assessment: Faces Faces Pain Scale: Hurts little more Pain Location: R abdomen Pain Descriptors / Indicators: Discomfort Pain Intervention(s): Limited activity within patient's tolerance, Monitored during session, Repositioned    Home Living Family/patient expects to be discharged to:: Private residence Living Arrangements: Spouse/significant other Available Help at Discharge: Family;Personal care attendant Type of Home: House Home Access: Stairs to enter;Ramped entrance   Entrance Stairs-Number of Steps: ramp   Home Layout: Able to live on main level with bedroom/bathroom Home Equipment: Grab bars - tub/shower;Grab bars - toilet;Shower seat Additional Comments: hoyer lift for wife; reports there will be RNs there in the mornings and evenings    Prior Function Prior Level of Function : Independent/Modified Independent             Mobility Comments: runs daily 4 miles, but uses RW when ambulating at home. 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 has caregiver he hired to (A) 6 hours on certain days but currently she is out of town     Journalist, newspaper   Dominant Hand: Right    Extremity/Trunk Assessment   Upper Extremity Assessment Upper Extremity Assessment: Defer to OT evaluation    Lower Extremity Assessment Lower Extremity Assessment: Generalized weakness    Cervical / Trunk Assessment Cervical / Trunk Assessment: Normal  Communication   Communication: No difficulties  Cognition Arousal/Alertness: Awake/alert Behavior  During Therapy: Impulsive Overall Cognitive Status: Impaired/Different from baseline Area of Impairment: Safety/judgement, Awareness, Problem solving, Memory                     Memory: Decreased short-term memory   Safety/Judgement: Decreased awareness of deficits, Decreased awareness of safety Awareness: Intellectual Problem Solving: Requires verbal cues, Requires tactile cues General Comments: Pt can be mildly impulsive with poor safety awareness, trying to get up to ambulate even though PT asking him to wait until vitals were measured due to his face turning read and pt wheezing sitting EOB. He can be a bit set in his way and resistive to PT trying to educate him to do something another way or to wait to ensure safety. SMT deficits, forgetting conversations about his male purewick not working and causing his bed to be saturated in urine.        General Comments General comments (skin integrity, edema, etc.): SpO2 >/= 96% on RA    Exercises Other Exercises Other Exercises: sit <> stand 5x in a row Other Exercises: incentive spirometer >/= 8x   Assessment/Plan    PT Assessment Patient needs continued PT services  PT Problem List Decreased activity tolerance;Decreased balance;Decreased mobility;Decreased knowledge of use of DME;Decreased safety awareness;Decreased knowledge of precautions;Pain;Decreased strength;Decreased cognition       PT Treatment Interventions DME instruction;Gait training;Functional mobility training;Therapeutic activities;Therapeutic exercise;Balance training;Patient/family  education;Neuromuscular re-education;Cognitive remediation    PT Goals (Current goals can be found in the Care Plan section)  Acute Rehab PT Goals Patient Stated Goal: to go home today PT Goal Formulation: With patient Time For Goal Achievement: 05/22/22 Potential to Achieve Goals: Good    Frequency Min 3X/week     Co-evaluation               AM-PAC PT "6 Clicks"  Mobility  Outcome Measure Help needed turning from your back to your side while in a flat bed without using bedrails?: None Help needed moving from lying on your back to sitting on the side of a flat bed without using bedrails?: A Little Help needed moving to and from a bed to a chair (including a wheelchair)?: A Little Help needed standing up from a chair using your arms (e.g., wheelchair or bedside chair)?: A Little Help needed to walk in hospital room?: A Little Help needed climbing 3-5 steps with a railing? : A Little 6 Click Score: 19    End of Session Equipment Utilized During Treatment: Gait belt Activity Tolerance: Patient tolerated treatment well Patient left: with call bell/phone within reach;in chair Nurse Communication: Mobility status;Other (comment) (pt verbalized he would not get up alone) PT Visit Diagnosis: Unsteadiness on feet (R26.81);Muscle weakness (generalized) (M62.81);Repeated falls (R29.6);Difficulty in walking, not elsewhere classified (R26.2)    Time: 9937-1696 PT Time Calculation (min) (ACUTE ONLY): 44 min   Charges:   PT Evaluation $PT Re-evaluation: 1 Re-eval PT Treatments $Gait Training: 8-22 mins $Therapeutic Activity: 8-22 mins        Moishe Spice, PT, DPT Acute Rehabilitation Services  Pager: [phone] Office: [place] 05/10/2022, 3:38 PM

## 2022-05-10 NOTE — Care Management Important Message (Signed)
Important Message  Patient Details  Name: John Mack MRN: [id number] Date of Birth: 1941/10/28   Medicare Important Message Given:  Yes     Orbie Pyo 05/10/2022, 2:18 PM

## 2022-05-10 NOTE — Discharge Summary (Signed)
Physician Discharge Summary  John Mack DJT:[id number] DOB: Jul 07, 1941 DOA: 05/07/2022  PCP: Prince Solian, MD  Admit date: 05/07/2022 Discharge date: 05/10/2022  Time spent: 40 minutes  Recommendations for Outpatient Follow-up:  Follow outpatient CBC/CMP  Follow with surgery outpatient as planned Follow long term plan regarding eliquis for anticoagulation for hx PE in 2022 Follow thyroid function tests outpatient    Discharge Diagnoses:  Active Problems:   Acute cholecystitis   Chronic systolic CHF (congestive heart failure) (HCC)   Elevated serum creatinine   Hypokalemia   Frequent PVCs   Bilateral pulmonary embolism (HCC)   Essential hypertension   Eosinophilic esophagitis -    Hypothyroidism   Sepsis ([place])   Anxiety   Asthma, chronic   Discharge Condition: stable  Diet recommendation: heart healthy  Filed Weights   05/07/22 0739 05/09/22 1017  Weight: 90.7 kg 90.7 kg    History of present illness:  81yom PMH traumatic SDH, pulmonary embolism, systolic CHF presented with epigastric abdominal pain and right upper quadrant pain with radiation to his chest. Admitted for sepsis secondary to acute cholecystitis. S/p lap chole and drain placement 5/30. Plan for home with home health 5/31.  See below for additional details  Hospital Course:  Assessment and Plan: * Severe sepsis ([place]) secondary to acute acalculus cholecystitis-resolved as of 05/09/2022 -- With tachypnea, leukocytosis, lactic acidosis on admission.  Overall stable hemodynamically.  WBC decreased, afebrile. --sepsis symptomatology resolved.  Acute cholecystitis --s/p lap chole with drain placement 5/30. Gangrenous and necrotic gallbladder. -- ok for discharge today per general surgery with drain in place  Elevated serum creatinine Improved, follow  Chronic systolic CHF (congestive heart failure) ([place]) --Followed by [facility]ology, last seen November 2022 at which time amiodarone was continued  to suppress PVCs and was noted to have low normal LVEF of 50%. --echo here shows LVEF 60 to 65%, normal function, no regional wall motion abnormalities, grade 1 diastolic dysfunction.  RV systolic function normal.  No significant valvular abnormalities noted. --No signs or symptoms of volume overload and not on any diuretics. --stable, continue metoprolol  Frequent PVCs --PMH 31% burden by holter, leading to start on amiodarone and beta blocker with near complete suppression (burden <1%)  --telemetry SR, EKG SR, no acute changes --Continue amiodarone, Toprol-XL  Hypokalemia -- normalized, follow   Bilateral pulmonary embolism (HCC) --Hx of provoked PE during hospitalization for SDH in 02/2021 due to prolonged immobilization. Repeat CTA of chest in 5/22 showed emboli had dissolved -- not obviously apparent who is managing anticoagulation outpatient, would recommend reviewing long term anticoagulation plan for him.  Given his recent surgery, will continue anticoagulation at this time.   Essential hypertension Resume home BP meds at discharge, metoprolol, amlodipine, HCTZ  Eosinophilic esophagitis -  --Continue PPI BID   Hypothyroidism --Continue levothyroxine; TSH high but refused increased dose in past - needs f/u thyroid functino tests outpatient  Asthma, chronic --stable, followed by [facility] pulmonology --Continue inhalers: Symbicort, Spiriva and SABA prn   Anxiety --continue '2mg'$  xanax at bedtime, PRN Ativan as at home      Procedures: Lap chole 5/30  Echo IMPRESSIONS     1. Left ventricular ejection fraction, by estimation, is 60 to 65%. The  left ventricle has normal function. The left ventricle has no regional  wall motion abnormalities. There is mild left ventricular hypertrophy.  Left ventricular diastolic parameters  are consistent with Grade I diastolic dysfunction (impaired relaxation).   2. Right ventricular systolic function is normal. The  right ventricular   size is normal. There is normal pulmonary artery systolic pressure.   3. The mitral valve is normal in structure. No evidence of mitral valve  regurgitation. No evidence of mitral stenosis.   4. The aortic valve is tricuspid. Aortic valve regurgitation is trivial.  Aortic valve sclerosis/calcification is present, without any evidence of  aortic stenosis.   5. Aortic dilatation noted. There is borderline dilatation of the aortic  root, measuring 39 mm.    Consultations: General surgery  Discharge Exam: Vitals:   05/10/22 0836 05/10/22 1200  BP: 106/66 121/83  Pulse: 64 64  Resp: 16   Temp: 98.2 F (36.8 C)   SpO2: 97%    No new complaints Eager for discharge Discussed with sister, she's concerned overall with his coming home - concerned for confusion, etc.  Discussed recommendations of home health and  that he seems oriented at this time.  General: No acute distress. Cardiovascular: RRR Lungs: unlabored Abdomen: distended, nontender, surgical incisions present - drain with serosanguinous drainage Neurological: Alert and oriented 3. Moves all extremities 4 . Cranial nerves II through XII grossly intact. Skin: Warm and dry. No rashes or lesions. Extremities: No clubbing or cyanosis. No edema.  Discharge Instructions   Discharge Instructions     Call MD for:  difficulty breathing, headache or visual disturbances   Complete by: As directed    Call MD for:  difficulty breathing, headache or visual disturbances   Complete by: As directed    Call MD for:  extreme fatigue   Complete by: As directed    Call MD for:  hives   Complete by: As directed    Call MD for:  hives   Complete by: As directed    Call MD for:  persistant dizziness or light-headedness   Complete by: As directed    Call MD for:  persistant nausea and vomiting   Complete by: As directed    Call MD for:  persistant nausea and vomiting   Complete by: As directed    Call MD for:  redness, tenderness, or  signs of infection (pain, swelling, redness, odor or green/yellow discharge around incision site)   Complete by: As directed    Call MD for:  redness, tenderness, or signs of infection (pain, swelling, redness, odor or green/yellow discharge around incision site)   Complete by: As directed    Call MD for:  severe uncontrolled pain   Complete by: As directed    Call MD for:  severe uncontrolled pain   Complete by: As directed    Call MD for:  temperature >100.4   Complete by: As directed    Call MD for:  temperature >100.4   Complete by: As directed    Diet - low sodium heart healthy   Complete by: As directed    Diet - low sodium heart healthy   Complete by: As directed    Discharge instructions   Complete by: As directed    You were seen for cholecystitis (inflammation of the gallbladder).  You've bee treated surgically with cholecystectomy (removal of gallbladder).  You should follow up outpatient with general surgery and your primary doctors outpatient.  Resume your home meds at discharge, including your eliquis (this can be restarted tonight).  Please review your eliquis plan with your PCP and/or primary prescriber.  Reviewing Kalem Rockwell plan for the duration of your anticoagulation every so often is important.  Your last Ct of the chest showed no evidence of  any blood clots in May (04/11/2021), which is good.  Please review with your PCP how long you should be on eliquis.  Return for new, recurrent, or worsening symtoms.  Please ask your PCP to request records from this hospitalization so they know what was done and what the next steps will be.   Discharge wound care:   Complete by: As directed    Ok to shower tomorrow. Glue will likely peel off in 1-3 weeks. No bandage required  Empty drain daily or when full, keep record of output   Discharge wound care:   Complete by: As directed    Per general surgery   Driving Restrictions   Complete by: As directed    No driving while on  narcotics   Increase activity slowly   Complete by: As directed    Increase activity slowly   Complete by: As directed    Lifting restrictions   Complete by: As directed    No lifting greater than 20 pounds for 3 weeks      Allergies as of 05/10/2022   No Known Allergies      Medication List     TAKE these medications    acetaminophen 325 MG tablet Commonly known as: TYLENOL Take 2 tablets (650 mg total) by mouth every 4 (four) hours as needed for mild pain (temp > 100.5).   Albuterol Sulfate (sensor) 108 (90 Base) MCG/ACT Aepb Commonly known as: ProAir Digihaler Inhale 1 puff into the lungs every 6 (six) hours as needed (shortness of breath).   alprazolam 2 MG tablet Commonly known as: XANAX Take 2 mg by mouth at bedtime.   amiodarone 200 MG tablet Commonly known as: PACERONE Take 1 tablet (200 mg total) by mouth daily.   amLODipine 2.5 MG tablet Commonly known as: NORVASC Take 1 tablet (2.5 mg total) by mouth daily.   apixaban 5 MG Tabs tablet Commonly known as: ELIQUIS Take 1 tablet (5 mg total) by mouth 2 (two) times daily.   budesonide-formoterol 160-4.5 MCG/ACT inhaler Commonly known as: SYMBICORT Inhale 2 puffs into the lungs 2 (two) times daily.   cyanocobalamin 1000 MCG/ML injection Commonly known as: (VITAMIN B-12) Inject 1,000 mcg into the muscle See admin instructions. Inject 1 mL (1077mg) intramuscularly every 30 days as needed for vitamin B12/energy.   esomeprazole 40 MG capsule Commonly known as: NEXIUM Take 1 capsule (40 mg total) by mouth 2 (two) times daily.   hydrochlorothiazide 25 MG tablet Commonly known as: HYDRODIURIL Take 25 mg by mouth every morning.   levothyroxine 137 MCG tablet Commonly known as: SYNTHROID Take 137 mcg by mouth every morning.   LORazepam 1 MG tablet Commonly known as: ATIVAN Take 0.5-1 mg by mouth 2 (two) times daily as needed for anxiety.   metoprolol succinate 50 MG 24 hr tablet Commonly known as:  TOPROL-XL Take 50 mg by mouth 2 (two) times daily.   montelukast 10 MG tablet Commonly known as: SINGULAIR Take 10 mg by mouth daily.   MULTIVITAMIN PO Take 1 tablet by mouth daily.   oxyCODONE 5 MG immediate release tablet Commonly known as: Oxy IR/ROXICODONE Take 1 tablet (5 mg total) by mouth every 6 (six) hours as needed for severe pain.   pravastatin 40 MG tablet Commonly known as: PRAVACHOL Take 40 mg by mouth daily.   Spiriva Respimat 1.25 MCG/ACT Aers Generic drug: Tiotropium Bromide Monohydrate Inhale 2 puffs into the lungs daily.   SYSTANE OP Place 1 drop into both eyes daily  as needed (dry eyes).   testosterone cypionate 200 MG/ML injection Commonly known as: DEPOTESTOSTERONE CYPIONATE Inject 200 mg into the muscle every 14 (fourteen) days.               Discharge Care Instructions  (From admission, onward)           Start     Ordered   05/10/22 0000  Discharge wound care:       Comments: Ok to shower tomorrow. Glue will likely peel off in 1-3 weeks. No bandage required  Empty drain daily or when full, keep record of output   05/10/22 0828   05/10/22 0000  Discharge wound care:       Comments: Per general surgery   05/10/22 1411           No Known Allergies  Follow-up Information     [facility] [place] Follow up.   Specialty: Home Health Services Contact information: [place] [address] [phone]         [facility], [place]. Go on 05/23/2022.   Specialty: General Surgery Why: Your appointment is 6/13 at 9:30am for drain check and possible removal Please arrive 30 minutes prior to your appointment to check in and fill out paperwork. Bring photo ID and insurance information. Contact information: [address] [place] [phone]        [facility], Emilie Rutter [place], [place]. Go on 06/01/2022.   Specialty: General Surgery Why: Your appointment is 6/22  at 2pm Please arrive 15 minutes early to check in Contact information: [place] [address] [phone]                  The results of significant diagnostics from this hospitalization (including imaging, microbiology, ancillary and laboratory) are listed below for reference.    Significant Diagnostic Studies: CT HEAD WO CONTRAST  Result Date: 05/07/2022 CLINICAL DATA:  81 year old male with altered mental status. EXAM: CT HEAD WITHOUT CONTRAST TECHNIQUE: Contiguous axial images were obtained from the base of the skull through the vertex without intravenous contrast. RADIATION DOSE REDUCTION: This exam was performed according to the departmental dose-optimization program which includes automated exposure control, adjustment of the mA and/or kV according to patient size and/or use of iterative reconstruction technique. COMPARISON:  12/14/2021 prior studies FINDINGS: Brain: No evidence of acute infarction, hemorrhage, hydrocephalus, extra-axial collection or mass lesion/mass effect. Mild atrophy and chronic small-vessel white matter ischemic changes again noted. Vascular: Mild carotid atherosclerotic calcifications are noted. Skull: Normal. Negative for fracture or focal lesion. Sinuses/Orbits: No acute finding. Other: None. IMPRESSION: 1. No evidence of acute intracranial abnormality. 2. Mild atrophy and chronic small-vessel white matter ischemic changes. Electronically Signed   By: Margarette Canada M.D.   On: 05/07/2022 08:05   CT ABDOMEN PELVIS W CONTRAST  Result Date: 05/07/2022 CLINICAL DATA:  Abdominal pain. EXAM: CT ABDOMEN AND PELVIS WITH CONTRAST TECHNIQUE: Multidetector CT imaging of the abdomen and pelvis was performed using the standard protocol following bolus administration of intravenous contrast. RADIATION DOSE REDUCTION: This exam was performed according to the departmental dose-optimization program which includes automated exposure control, adjustment  of the mA and/or kV according to patient size and/or use of iterative reconstruction technique. CONTRAST:  176m OMNIPAQUE IOHEXOL 300 MG/ML  SOLN COMPARISON:  None Available. FINDINGS: Lower chest: No acute findings.  Mild cardiomegaly. Hepatobiliary: Liver normal in size and overall attenuation. 5 mm low attenuation  lesion, segment 6, consistent with Laniesha Das cyst. No other masses or lesions. Gallbladder is distended with evidence of wall thickening and pericholecystic inflammation. No visualized stone. No duct dilation. Pancreas: Partial fatty replacement. No pancreatic mass or inflammation. No duct dilation. Spleen: Normal in size without focal abnormality. Adrenals/Urinary Tract: Normal adrenal glands. Kidneys normal size, orientation and position. 2.1 cm posterior upper pole right renal cyst. No other renal masses, no stones and no hydronephrosis. Normal ureters. Normal bladder. Stomach/Bowel: Normal stomach. Small bowel and colon are normal in caliber. No wall thickening. No inflammation. Mild generalized increase in the colonic stool burden. Vascular/Lymphatic: Aortic atherosclerosis. No aneurysm. No enlarged lymph nodes. Reproductive: Unremarkable. Other: No abdominal wall hernia or abnormality. No abdominopelvic ascites. Musculoskeletal: No fracture or acute finding.  No bone lesion. IMPRESSION: 1. Findings consistent with acute cholecystitis. No visible gallstone. Consider follow-up limited right upper quadrant ultrasound to assess for gallstones. 2. No other acute abnormality within the abdomen or pelvis. 3. Aortic atherosclerosis. Electronically Signed   By: Lajean Manes M.D.   On: 05/07/2022 13:04   DG Chest Port 1 View  Result Date: 05/07/2022 CLINICAL DATA:  Weakness.  Leukocytosis. EXAM: PORTABLE CHEST 1 VIEW COMPARISON:  05/23/2012.  CT, 02/10/2021. FINDINGS: Mild enlargement of the cardiac silhouette, stable. No mediastinal or hilar masses. Clear lungs.  No convincing pleural effusion.  No  pneumothorax. Skeletal structures are grossly intact. IMPRESSION: No acute cardiopulmonary disease. Electronically Signed   By: Lajean Manes M.D.   On: 05/07/2022 10:32   ECHOCARDIOGRAM COMPLETE  Result Date: 05/08/2022    ECHOCARDIOGRAM REPORT   Patient Name:   SIONE BAUMGARTEN Date of Exam: 05/08/2022 Medical Rec #:  [id number]        Height:       70.0 in Accession #:    [id number]       Weight:       200.0 lb Date of Birth:  1941/06/22       BSA:          2.087 m Patient Age:    36 years         BP:           115/65 mmHg Patient Gender: M                HR:           85 bpm. Exam Location:  Inpatient Procedure: 2D Echo, Cardiac Doppler and Color Doppler Indications:    CHF-Acute Systolic Q65.78  History:        Patient has prior history of Echocardiogram examinations, most                 recent 02/10/2021. TIA, Arrythmias:PVC; Risk Factors:Hypertension                 and Dyslipidemia. Past history of pulmonary embolism and DVT.  Sonographer:    Darlina Sicilian RDCS Referring Phys: [id number] [place]  1. Left ventricular ejection fraction, by estimation, is 60 to 65%. The left ventricle has normal function. The left ventricle has no regional wall motion abnormalities. There is mild left ventricular hypertrophy. Left ventricular diastolic parameters are consistent with Grade I diastolic dysfunction (impaired relaxation).  2. Right ventricular systolic function is normal. The right ventricular size is normal. There is normal pulmonary artery systolic pressure.  3. The mitral valve is normal in structure. No evidence of mitral valve regurgitation. No evidence of mitral stenosis.  4. The aortic valve is tricuspid.  Aortic valve regurgitation is trivial. Aortic valve sclerosis/calcification is present, without any evidence of aortic stenosis.  5. Aortic dilatation noted. There is borderline dilatation of the aortic root, measuring 39 mm. FINDINGS  Left Ventricle: Left ventricular ejection fraction, by  estimation, is 60 to 65%. The left ventricle has normal function. The left ventricle has no regional wall motion abnormalities. The left ventricular internal cavity size was normal in size. There is  mild left ventricular hypertrophy. Left ventricular diastolic parameters are consistent with Grade I diastolic dysfunction (impaired relaxation). Right Ventricle: The right ventricular size is normal. Right ventricular systolic function is normal. There is normal pulmonary artery systolic pressure. The tricuspid regurgitant velocity is 2.66 m/s, and with an assumed right atrial pressure of 3 mmHg,  the estimated right ventricular systolic pressure is 09.8 mmHg. Left Atrium: Left atrial size was normal in size. Right Atrium: Right atrial size was normal in size. Pericardium: There is no evidence of pericardial effusion. Mitral Valve: The mitral valve is normal in structure. No evidence of mitral valve regurgitation. No evidence of mitral valve stenosis. Tricuspid Valve: The tricuspid valve is normal in structure. Tricuspid valve regurgitation is mild . No evidence of tricuspid stenosis. Aortic Valve: The aortic valve is tricuspid. Aortic valve regurgitation is trivial. Aortic valve sclerosis/calcification is present, without any evidence of aortic stenosis. Pulmonic Valve: The pulmonic valve was normal in structure. Pulmonic valve regurgitation is trivial. No evidence of pulmonic stenosis. Aorta: Aortic dilatation noted. There is borderline dilatation of the aortic root, measuring 39 mm. Venous: The inferior vena cava was not well visualized. IAS/Shunts: No atrial level shunt detected by color flow Doppler.  LEFT VENTRICLE PLAX 2D LVIDd:         5.00 cm   Diastology LVIDs:         3.10 cm   LV e' medial:    5.03 cm/s LV PW:         1.15 cm   LV E/e' medial:  12.0 LV IVS:        1.45 cm   LV e' lateral:   4.22 cm/s LVOT diam:     2.20 cm   LV E/e' lateral: 14.3 LV SV:         35 LV SV Index:   17 LVOT Area:     3.80 cm   RIGHT VENTRICLE RV S prime:     17.30 cm/s TAPSE (M-mode): 1.6 cm LEFT ATRIUM           Index        RIGHT ATRIUM          Index LA diam:      4.10 cm 1.96 cm/m   RA Area:     8.91 cm LA Vol (A2C): 35.7 ml 17.10 ml/m  RA Volume:   12.00 ml 5.75 ml/m LA Vol (A4C): 46.0 ml 22.04 ml/m  AORTIC VALVE LVOT Vmax:   56.00 cm/s LVOT Vmean:  37.600 cm/s LVOT VTI:    0.092 m  AORTA Ao Root diam: 3.90 cm Ao Asc diam:  3.40 cm MITRAL VALVE               TRICUSPID VALVE MV Area (PHT): 9.85 cm    TR Peak grad:   28.3 mmHg MV Decel Time: 77 msec     TR Vmax:        266.00 cm/s MV E velocity: 60.40 cm/s MV Aspynn Clover velocity: 64.40 cm/s  SHUNTS MV E/Daiana Vitiello ratio:  0.94  Systemic VTI:  0.09 m                            Systemic Diam: 2.20 cm Kirk Ruths MD Electronically signed by Kirk Ruths MD Signature Date/Time: 05/08/2022/10:46:04 AM    Final    US Abdomen Limited RUQ (LIVER/GB)  Result Date: 05/08/2022 CLINICAL DATA:  Right upper quadrant pain. EXAM: ULTRASOUND ABDOMEN LIMITED RIGHT UPPER QUADRANT COMPARISON:  CT examination dated 05/07/2022 difference FINDINGS: Gallbladder: Marked gallbladder wall thickening and pericholecystic fluid without appreciable gallstones. No sonographic Murphy sign noted by sonographer. Common bile duct: Diameter: Normal measuring up to 3 mm Liver: No focal lesion identified. Within normal limits in parenchymal echogenicity. Portal vein is patent on color Doppler imaging with normal direction of blood flow towards the liver. Other: None. IMPRESSION: 1. Marked gallbladder wall thickening with pericholecystic fluid without evidence of gallstones suggesting acalculous cholecystitis. Clinical correlation is suggested. 2. No intrahepatic biliary ductal dilatation. Liver is unremarkable. Electronically Signed   By: Keane Police D.O.   On: 05/08/2022 11:13    Microbiology: Recent Results (from the past 240 hour(s))  Resp Panel by RT-PCR (Flu Frannie Shedrick&B, Covid)     Status: None   Collection Time:  05/07/22  7:44 AM   Specimen: Nasal Swab  Result Value Ref Range Status   SARS Coronavirus 2 by RT PCR NEGATIVE NEGATIVE Final    Comment: (NOTE) SARS-CoV-2 target nucleic acids are NOT DETECTED.  The SARS-CoV-2 RNA is generally detectable in upper respiratory specimens during the acute phase of infection. The lowest concentration of SARS-CoV-2 viral copies this assay can detect is 138 copies/mL. Johnda Billiot negative result does not preclude SARS-Cov-2 infection and should not be used as the sole basis for treatment or other patient management decisions. Shaana Acocella negative result may occur with  improper specimen collection/handling, submission of specimen other than nasopharyngeal swab, presence of viral mutation(s) within the areas targeted by this assay, and inadequate number of viral copies(<138 copies/mL). Kealii Thueson negative result must be combined with clinical observations, patient history, and epidemiological information. The expected result is Negative.  Fact Sheet for Patients:  EntrepreneurPulse.com.au  Fact Sheet for Healthcare Providers:  IncredibleEmployment.be  This test is no t yet approved or cleared by the [place] FDA and  has been authorized for detection and/or diagnosis of SARS-CoV-2 by FDA under an Emergency Use Authorization (EUA). This EUA will remain  in effect (meaning this test can be used) for the duration of the COVID-19 declaration under Section 564(b)(1) of the Act, 21 U.S.C.section 360bbb-3(b)(1), unless the authorization is terminated  or revoked sooner.       Influenza Sophy Mesler by PCR NEGATIVE NEGATIVE Final   Influenza B by PCR NEGATIVE NEGATIVE Final    Comment: (NOTE) The Xpert Xpress SARS-CoV-2/FLU/RSV plus assay is intended as an aid in the diagnosis of influenza from Nasopharyngeal swab specimens and should not be used as Kazuma Elena sole basis for treatment. Nasal washings and aspirates are unacceptable for Xpert Xpress  SARS-CoV-2/FLU/RSV testing.  Fact Sheet for Patients: EntrepreneurPulse.com.au  Fact Sheet for Healthcare Providers: IncredibleEmployment.be  This test is not yet approved or cleared by the [place] FDA and has been authorized for detection and/or diagnosis of SARS-CoV-2 by FDA under an Emergency Use Authorization (EUA). This EUA will remain in effect (meaning this test can be used) for the duration of the COVID-19 declaration under Section 564(b)(1) of the Act, 21 U.S.C. section 360bbb-3(b)(1), unless the authorization is  terminated or revoked.  Performed at [facility], [place] [address], [place], [place] [address]   Blood culture (routine x 2)     Status: None (Preliminary result)   Collection Time: 05/07/22 12:10 PM   Specimen: BLOOD LEFT HAND  Result Value Ref Range Status   Specimen Description BLOOD LEFT HAND  Final   Special Requests   Final    BOTTLES DRAWN AEROBIC ONLY Blood Culture results may not be optimal due to an inadequate volume of blood received in culture bottles   Culture   Final    NO GROWTH 3 DAYS Performed at [facility], [place] [address], [place], [place] [address]    Report Status PENDING  Incomplete  Blood culture (routine x 2)     Status: None (Preliminary result)   Collection Time: 05/07/22  8:07 PM   Specimen: BLOOD  Result Value Ref Range Status   Specimen Description BLOOD LEFT ANTECUBITAL  Final   Special Requests   Final    BOTTLES DRAWN AEROBIC AND ANAEROBIC Blood Culture adequate volume   Culture   Final    NO GROWTH 3 DAYS Performed at [facility], [place] [address], [place], [place] [address]    Report Status PENDING  Incomplete     Labs: Basic Metabolic Panel: Recent Labs  Lab 05/07/22 0751 05/07/22 0759 05/07/22 2000 05/08/22 0104 05/09/22 0131 05/10/22 0202  NA 136 133*  --  133* 131* 131*  K 3.1* 3.1*  --  3.0* 3.3* 3.6  CL 96* 94*  --  99 94* 97*  CO2 26  --   --   '25 25 26  '$ GLUCOSE 134* 127*  --  124* 116* 205*  BUN 13 16  --  '12 16 14  '$ CREATININE 1.04 0.90  --  0.98 1.27* 1.02  CALCIUM 9.2  --   --  8.0* 8.2* 7.8*  MG  --   --  1.6*  --  2.5*  --    Liver Function Tests: Recent Labs  Lab 05/07/22 0751 05/08/22 0104 05/09/22 0131 05/10/22 0202  AST '31 27 21 '$ 41  ALT '18 14 13 21  '$ ALKPHOS 33* 25* 30* 29*  BILITOT 1.1 1.3* 1.1 0.7  PROT 6.9 5.6* 6.1* 5.7*  ALBUMIN 4.1 3.1* 3.0* 2.5*   No results for input(s): LIPASE, AMYLASE in the last 168 hours. No results for input(s): AMMONIA in the last 168 hours. CBC: Recent Labs  Lab 05/07/22 0751 05/07/22 0759 05/08/22 0104 05/09/22 0131  WBC 16.9*  --  17.0* 13.2*  NEUTROABS 15.1*  --   --   --   HGB 14.4 15.6 12.4* 12.3*  HCT 43.3 46.0 35.7* 36.2*  MCV 90.6  --  89.7 90.5  PLT 232  --  196 195   Cardiac Enzymes: No results for input(s): CKTOTAL, CKMB, CKMBINDEX, TROPONINI in the last 168 hours. BNP: BNP (last 3 results) No results for input(s): BNP in the last 8760 hours.  ProBNP (last 3 results) No results for input(s): PROBNP in the last 8760 hours.  CBG: No results for input(s): GLUCAP in the last 168 hours.     Signed:  Fayrene Helper MD.  [facility] 05/10/2022, 2:36 PM

## 2022-05-10 NOTE — Discharge Instructions (Signed)
[facility] ______[facility] LAPAROSCOPIC SURGERY: POST OP INSTRUCTIONS Always review your discharge instruction sheet given to you by the facility where your surgery was performed. IF YOU HAVE DISABILITY OR FAMILY LEAVE FORMS, YOU MUST BRING THEM TO THE OFFICE FOR PROCESSING.   DO NOT GIVE THEM TO YOUR DOCTOR.  A prescription for pain medication may be given to you upon discharge.  Take your pain medication as prescribed, if needed.  If narcotic pain medicine is not needed, then you may take acetaminophen (Tylenol) or ibuprofen (Advil) as needed. Take your usually prescribed medications unless otherwise directed. If you need a refill on your pain medication, please contact your pharmacy.  They will contact our office to request authorization. Prescriptions will not be filled after 5pm or on week-ends. You should follow a light diet the first few days after arrival home, such as soup and crackers, etc.  Be sure to include lots of fluids daily. Most patients will experience some swelling and bruising in the area of the incisions.  Ice packs will help.  Swelling and bruising can take several days to resolve.  It is common to experience some constipation if taking pain medication after surgery.  Increasing fluid intake and taking a stool softener (such as Colace) will usually help or prevent this problem from occurring.  A mild laxative (Milk of Magnesia or Miralax) should be taken according to package instructions if there are no bowel movements after 48 hours. Unless discharge instructions indicate otherwise, you may remove your bandages 24-48 hours after surgery, and you may shower at that time.  You may have steri-strips (small skin tapes) in place directly over the incision.  These strips should be left on the skin for 7-10 days.  If your surgeon used skin glue on the incision, you may shower in 24 hours.  The glue will flake off over the next 2-3 weeks.  Any sutures or staples will be  removed at the office during your follow-up visit. ACTIVITIES:  You may resume regular (light) daily activities beginning the next day--such as daily self-care, walking, climbing stairs--gradually increasing activities as tolerated.  You may have sexual intercourse when it is comfortable.  Refrain from any heavy lifting or straining until approved by your doctor. You may drive when you are no longer taking prescription pain medication, you can comfortably wear a seatbelt, and you can safely maneuver your car and apply brakes. RETURN TO WORK:  __________________________________________________________ You should see your doctor in the office for a follow-up appointment approximately 2-3 weeks after your surgery.  Make sure that you call for this appointment within a day or two after you arrive home to insure a convenient appointment time. OTHER INSTRUCTIONS: __________________________________________________________________________________________________________________________ __________________________________________________________________________________________________________________________ WHEN TO CALL YOUR DOCTOR: Fever over 101.0 Inability to urinate Continued bleeding from incision. Increased pain, redness, or drainage from the incision. Increasing abdominal pain  The clinic staff is available to answer your questions during regular business hours.  Please don't hesitate to call and ask to speak to one of the nurses for clinical concerns.  If you have a medical emergency, go to the nearest emergency room or call 911.  A surgeon from [facility] is always on call at the hospital. [address], [address], [place], [place]  [address] ? [address] [address], [place],    [address] [phone] ? [phone] ? FAX [phone] Web site: [email or web address]  

## 2022-05-10 NOTE — Plan of Care (Signed)

## 2022-05-10 NOTE — TOC Transition Note (Addendum)
Transition of Care [facility]) - CM/SW Discharge Note   Patient Details  Name: John Mack MRN: [id number] Date of Birth: 1940/12/30  Transition of Care [facility]) CM/SW Contact:  Carles Collet, RN Phone Number: 05/10/2022, 2:24 PM   Clinical Narrative:   Damaris Schooner w patient verified DC plan for home and that he has family for transportation. Notified [facility] of discharge Notified ED CSW who is assigned to wife that patient has a DC order    Final next level of care: Home w Home Health Services Barriers to Discharge: No Barriers Identified   Patient Goals and CMS Choice Patient states their goals for this hospitalization and ongoing recovery are:: to return home CMS Medicare.gov Compare Post Acute Care list provided to:: Patient Choice offered to / list presented to : Patient  Discharge Placement                       Discharge Plan and Services   Discharge Planning Services: CM Consult Post Acute Care Choice: Home Health          DME Arranged: N/A DME Agency: NA       HH Arranged: RN, PT, OT HH Agency: [place] Date [place]: 05/10/22 Time [place]: 88 Representative spoke with at [place]: [place] ([place]) Interventions     Readmission Risk Interventions    02/16/2021   12:37 PM  Readmission Risk Prevention Plan  Post Dischage Appt Not Complete  Appt Comments per MD f/u within 3 wks  Medication Screening Complete  Transportation Screening Complete

## 2022-05-12 LAB — CULTURE, BLOOD (ROUTINE X 2)
Culture: NO GROWTH
Culture: NO GROWTH
Special Requests: ADEQUATE

## 2022-09-15 IMAGING — US US ABDOMEN LIMITED
1 series · 14 of 25 positions shown · non-contrast
Comparison: CT examination dated 05/07/2022 difference

CLINICAL DATA: Right upper quadrant pain.

EXAM:
ULTRASOUND ABDOMEN LIMITED RIGHT UPPER QUADRANT

[Series 1: us abdomen limited ruq (liver/gb) · 14 of 44 slices shown]
[im 1/44]
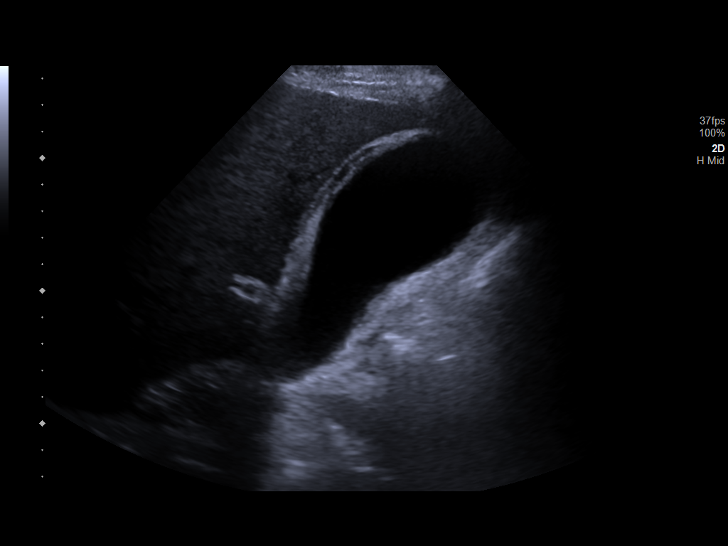
[im 4/44]
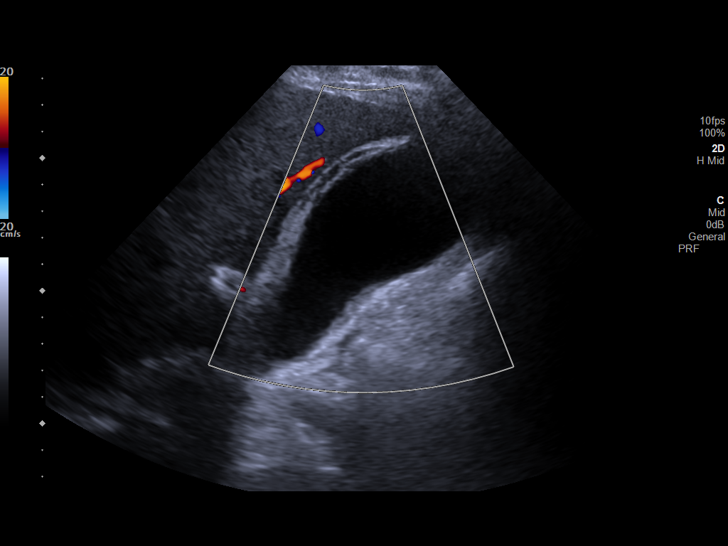
[im 8/44]
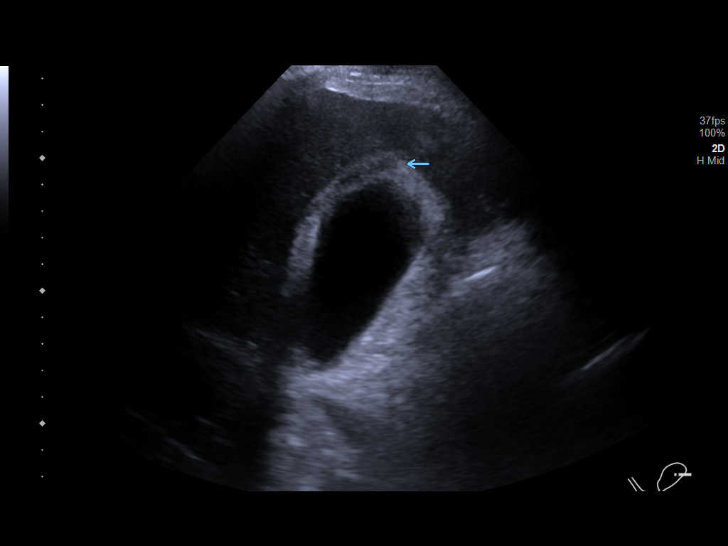
[im 11/44]
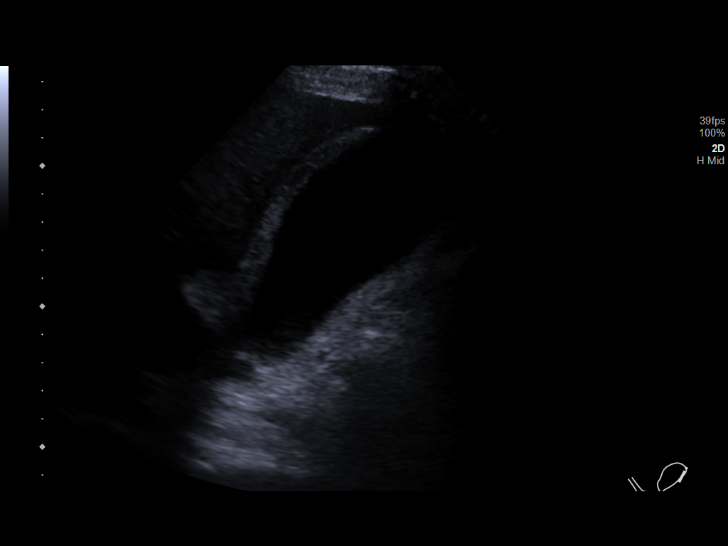
[im 15/44]
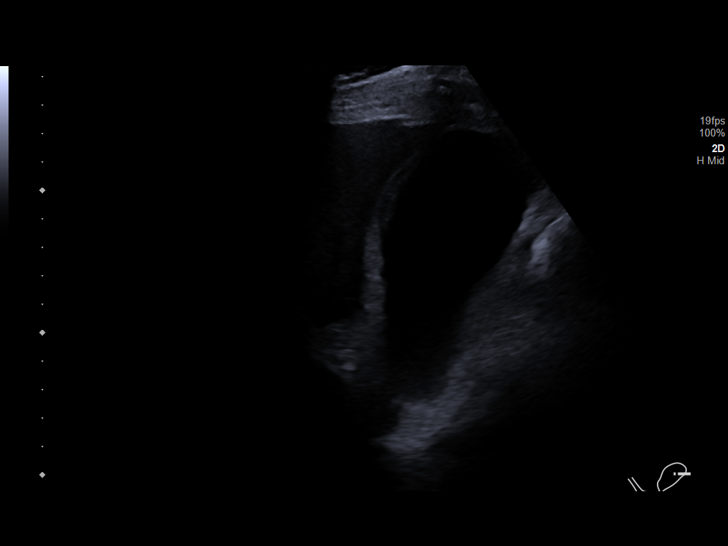
[im 17/44]
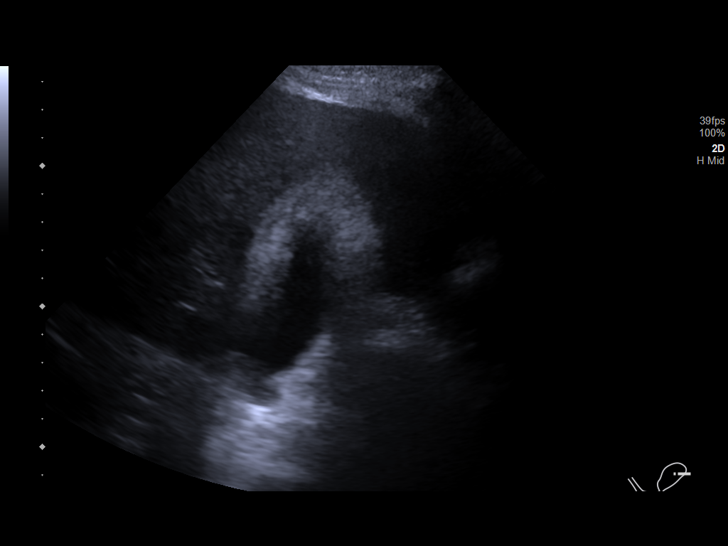
[im 20/44]
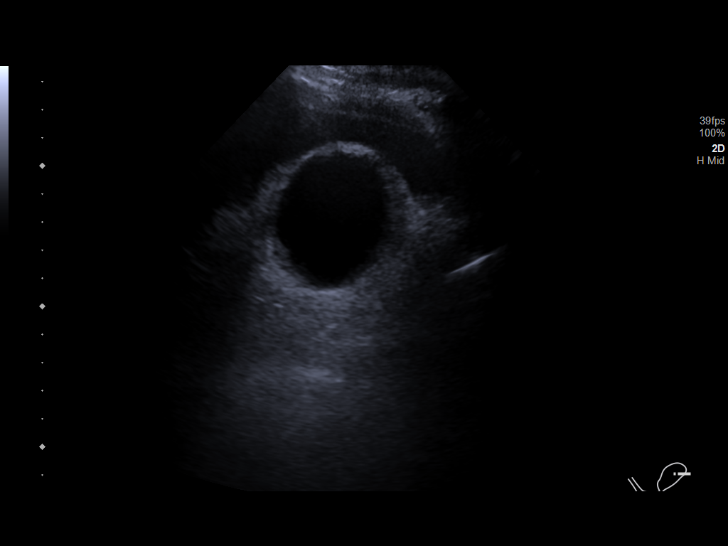
[im 24/44]
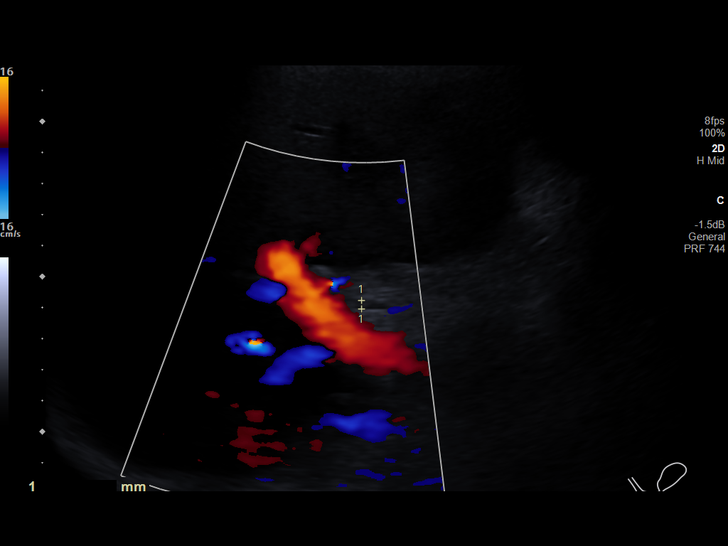
[im 27/44]
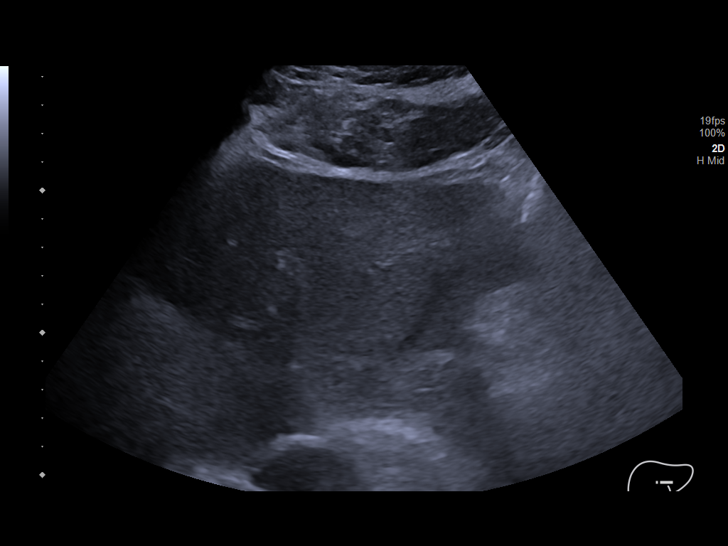
[im 29/44]
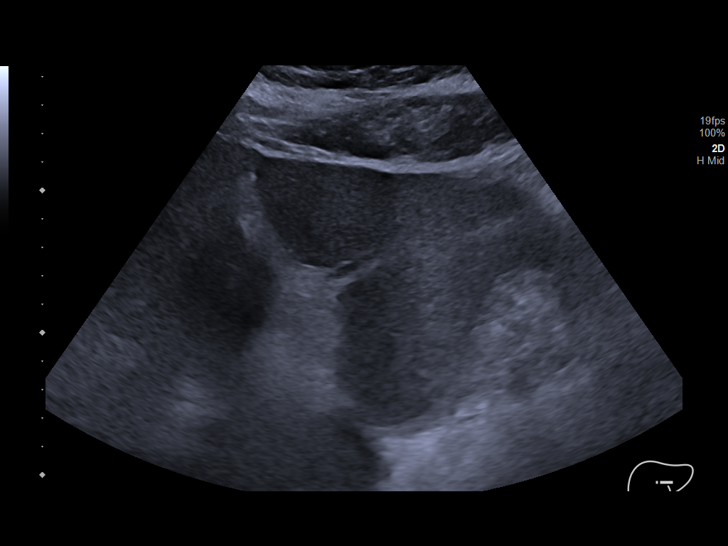
[im 33/44]
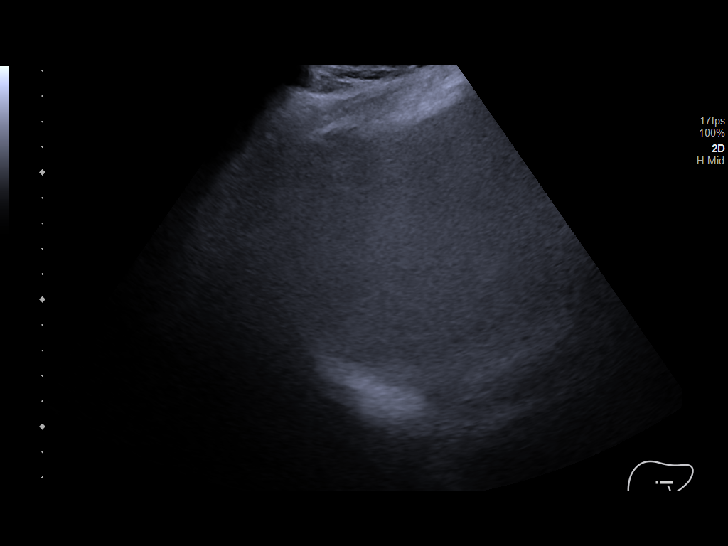
[im 36/44]
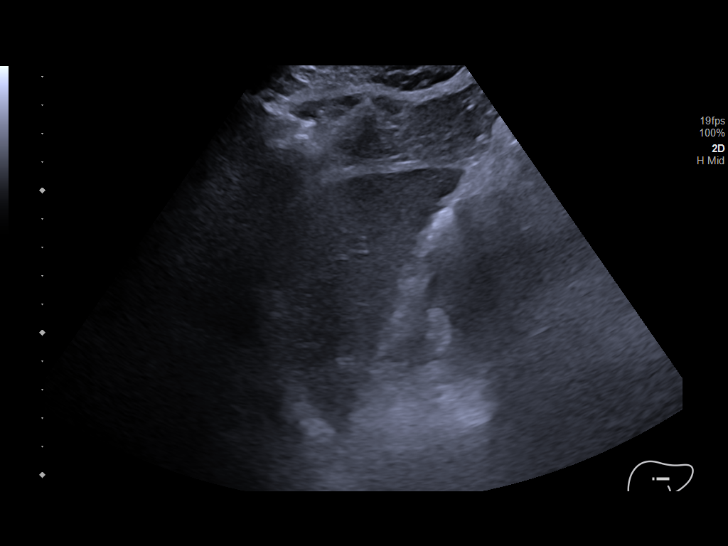
[im 40/44]
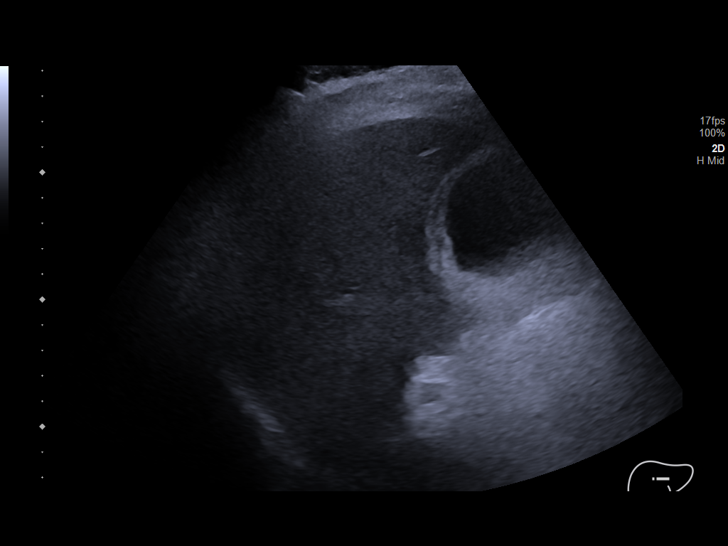
[im 44/44]
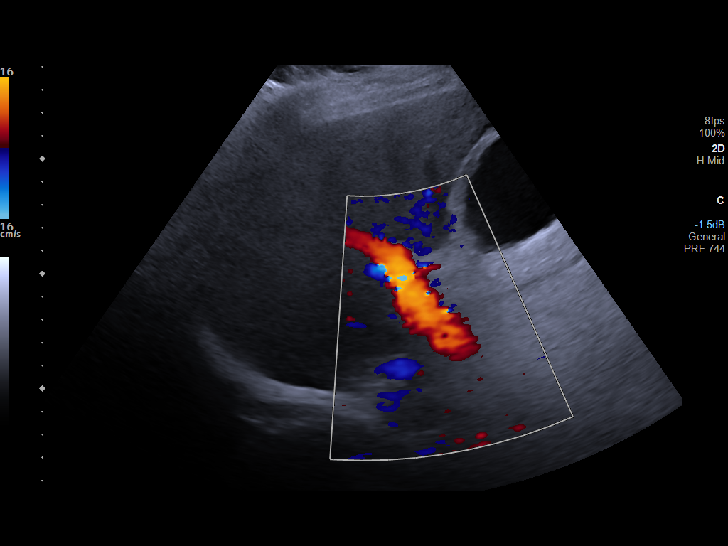

[14 of 25 positions shown; findings below may reference images not displayed]

FINDINGS: Gallbladder:

Marked gallbladder wall thickening and pericholecystic fluid without
appreciable gallstones. No sonographic Murphy sign noted by
sonographer.

Common bile duct:

Diameter: Normal measuring up to 3 mm

Liver:

No focal lesion identified. Within normal limits in parenchymal
echogenicity. Portal vein is patent on color Doppler imaging with
normal direction of blood flow towards the liver.

Other: None.
IMPRESSION: 1. Marked gallbladder wall thickening with pericholecystic fluid
without evidence of gallstones suggesting acalculous cholecystitis.
Clinical correlation is suggested.
2. No intrahepatic biliary ductal dilatation. Liver is unremarkable.

## 2022-10-26 ENCOUNTER — Encounter: Payer: Self-pay | Admitting: Internal Medicine

## 2022-10-26 ENCOUNTER — Ambulatory Visit (INDEPENDENT_AMBULATORY_CARE_PROVIDER_SITE_OTHER): Payer: Medicare Other | Admitting: Internal Medicine

## 2022-10-26 ENCOUNTER — Other Ambulatory Visit (INDEPENDENT_AMBULATORY_CARE_PROVIDER_SITE_OTHER): Payer: Medicare Other

## 2022-10-26 VITALS — BP 130/70 | HR 55 | Ht 70.0 in | Wt 213.2 lb

## 2022-10-26 DIAGNOSIS — R1033 Periumbilical pain: Secondary | ICD-10-CM | POA: Diagnosis not present

## 2022-10-26 DIAGNOSIS — R1013 Epigastric pain: Secondary | ICD-10-CM

## 2022-10-26 DIAGNOSIS — K915 Postcholecystectomy syndrome: Secondary | ICD-10-CM

## 2022-10-26 DIAGNOSIS — R197 Diarrhea, unspecified: Secondary | ICD-10-CM

## 2022-10-26 LAB — CBC WITH DIFFERENTIAL/PLATELET
Basophils Absolute: 0.1 10*3/uL (ref 0.0–0.1)
Basophils Relative: 0.9 % (ref 0.0–3.0)
Eosinophils Absolute: 0.3 10*3/uL (ref 0.0–0.7)
Eosinophils Relative: 3.5 % (ref 0.0–5.0)
HCT: 42.5 % (ref 39.0–52.0)
Hemoglobin: 14.6 g/dL (ref 13.0–17.0)
Lymphocytes Relative: 14.8 % (ref 12.0–46.0)
Lymphs Abs: 1.1 10*3/uL (ref 0.7–4.0)
MCHC: 34.3 g/dL (ref 30.0–36.0)
MCV: 87.6 fl (ref 78.0–100.0)
Monocytes Absolute: 0.6 10*3/uL (ref 0.1–1.0)
Monocytes Relative: 7.7 % (ref 3.0–12.0)
Neutro Abs: 5.5 10*3/uL (ref 1.4–7.7)
Neutrophils Relative %: 73.1 % (ref 43.0–77.0)
Platelets: 258 10*3/uL (ref 150.0–400.0)
RBC: 4.85 Mil/uL (ref 4.22–5.81)
RDW: 15.5 % (ref 11.5–15.5)
WBC: 7.6 10*3/uL (ref 4.0–10.5)

## 2022-10-26 LAB — COMPREHENSIVE METABOLIC PANEL
ALT: 13 U/L (ref 0–53)
AST: 24 U/L (ref 0–37)
Albumin: 4.3 g/dL (ref 3.5–5.2)
Alkaline Phosphatase: 41 U/L (ref 39–117)
BUN: 13 mg/dL (ref 6–23)
CO2: 29 mEq/L (ref 19–32)
Calcium: 9.4 mg/dL (ref 8.4–10.5)
Chloride: 91 mEq/L — ABNORMAL LOW (ref 96–112)
Creatinine, Ser: 0.88 mg/dL (ref 0.40–1.50)
GFR: 82.12 mL/min (ref >60.00)
Glucose, Bld: 92 mg/dL (ref 70–99)
Potassium: 3.4 mEq/L — ABNORMAL LOW (ref 3.5–5.1)
Sodium: 129 mEq/L — ABNORMAL LOW (ref 135–145)
Total Bilirubin: 0.7 mg/dL (ref 0.2–1.2)
Total Protein: 7.2 g/dL (ref 6.0–8.3)

## 2022-10-26 LAB — LIPASE: Lipase: 11 U/L (ref 11.0–59.0)

## 2022-10-26 MED ORDER — CHOLESTYRAMINE LIGHT 4 GM/DOSE PO POWD: 4.0000 g | Freq: Every day | ORAL | 4 refills | Status: DC

## 2022-10-26 NOTE — Progress Notes (Signed)
John Mack 81 y.o. 12-02-1941 [id number]  Assessment & Plan:   Encounter Diagnoses  Name Primary?   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  However in June he was seen by the surgeons and denied any of the symptoms about a month after his cholecystectomy.  Question if he has a common duct stone.  Seems unlikely but is possible.  Common duct stones would cause pain but do not typically cause diarrhea.  I am going to evaluate as below and give a therapeutic trial of cholestyramine for his diarrhea.  Once labs have returned and ultrasound has returned we will determine neck steps.  Note that he is on Eliquis with history of DVT/PE.  Orders Placed This Encounter  Procedures   US Abdomen Complete   CBC with Differential/Platelet   Comprehensive metabolic panel   Lipase   Meds ordered this encounter  Medications   cholestyramine light (PREVALITE) 4 GM/DOSE powder    Sig: Take 1 packet (4 g total) by mouth daily with lunch.    Dispense:  231 g    Refill:  4       Subjective:   Chief Complaint: Diarrhea and abdominal pain  HPI 81 year old white man status post cholecystectomy (laparoscopic) in May of this year, gangrenous gallbladder, drain left in who presents with frequent diarrhea and epigastric pain since that time.  He is a bit of a vague historian, he could not give me an exact symptom complex prior to the cholecystectomy but ever since then he will have episodic sharp stabbing epigastric and periumbilical pains.  There is diarrhea throughout the day time hours that is loose sometimes watery sometimes slightly formed.  Urgent defecation.  He has not tried any over-the-counter medications.  No appetite change or weight loss.  There is some postprandial diarrhea, perhaps that is the  predominant pattern.   GI history notable for eosinophilic esophagitis, history of colon polyps, abdominal wall strain, GERD, and in 2018 colonoscopy showed a 5 mm adenoma and left-sided diverticulosis.  Last upper endoscopy in January 2017 with a 12 French Maloney dilation, and esophageal and gastric biopsies without any esophagitis.  EOE was in remission.  During the May admission CT scanning of the abdomen pelvis and ultrasound were consistent with acute cholecystitis and no bile duct dilation.  I reviewed the op note as well.  There was no intraoperative cholangiogram.   Wt Readings from Last 3 Encounters:  10/26/22 213 lb 3.2 oz (96.7 kg)  05/09/22 200 lb (90.7 kg)  03/17/21 205 lb 6.4 oz (93.2 kg)    No Known Allergies Current Meds  Medication Sig   acetaminophen (TYLENOL) 325 MG tablet Take 2 tablets (650 mg total) by mouth every 4 (four) hours as needed for mild pain (temp > 100.5).   Albuterol Sulfate, sensor, (PROAIR DIGIHALER) 108 (90 Base) MCG/ACT AEPB Inhale 1 puff into the lungs every 6 (six) hours as needed (shortness of breath).   alprazolam (XANAX) 2 MG tablet Take 2 mg by mouth at bedtime.   amiodarone (PACERONE) 200 MG tablet Take 1 tablet (200 mg total) by mouth daily.   amLODipine (NORVASC) 2.5 MG tablet Take 1 tablet (2.5 mg total) by mouth daily.   apixaban (ELIQUIS) 5 MG TABS tablet Take 1 tablet (5 mg total) by mouth 2 (two) times daily.  budesonide-formoterol (SYMBICORT) 160-4.5 MCG/ACT inhaler Inhale 2 puffs into the lungs 2 (two) times daily.   cyanocobalamin (,VITAMIN B-12,) 1000 MCG/ML injection Inject 1,000 mcg into the muscle See admin instructions. Inject 1 mL (1051mg) intramuscularly every 30 days as needed for vitamin B12/energy.   esomeprazole (NEXIUM) 40 MG capsule Take 1 capsule (40 mg total) by mouth 2 (two) times daily.   hydrochlorothiazide (HYDRODIURIL) 25 MG tablet Take 25 mg by mouth every morning.   levothyroxine (SYNTHROID) 137 MCG tablet  Take 137 mcg by mouth every morning.   LORazepam (ATIVAN) 1 MG tablet Take 0.5-1 mg by mouth 2 (two) times daily as needed for anxiety.   metoprolol succinate (TOPROL-XL) 50 MG 24 hr tablet Take 50 mg by mouth 2 (two) times daily.   montelukast (SINGULAIR) 10 MG tablet Take 10 mg by mouth daily.   Multiple Vitamins-Minerals (MULTIVITAMIN PO) Take 1 tablet by mouth daily.    oxyCODONE (OXY IR/ROXICODONE) 5 MG immediate release tablet Take 1 tablet (5 mg total) by mouth every 6 (six) hours as needed for severe pain.   Polyethyl Glycol-Propyl Glycol (SYSTANE OP) Place 1 drop into both eyes daily as needed (dry eyes).   pravastatin (PRAVACHOL) 40 MG tablet Take 40 mg by mouth daily.   testosterone cypionate (DEPOTESTOSTERONE CYPIONATE) 200 MG/ML injection Inject 200 mg into the muscle every 14 (fourteen) days.   Tiotropium Bromide Monohydrate (SPIRIVA RESPIMAT) 1.25 MCG/ACT AERS Inhale 2 puffs into the lungs daily.   Past Medical History:  Diagnosis Date   Allergy    Anxiety    Arthritis    Asthma    Cancer ([facility]    skin   CHF (congestive heart failure) (HCC)    chornic systolic CHF   DCM ([facility]ilated [facility]) ([facility] 04/18/2017   [facility]  Depression    Diverticulosis    Dysrhythmia    Eosinophilic esophagitis 55/42/7062  EGD 04/2014 17 Eos/hpf esophageal bxs   Eye abnormalities    calcium deposits behind eyes   Frequent PVCs    GERD (gastroesophageal reflux disease)    Hx of colonic polyps 12/18/2011   Hyperlipidemia    Hypertension    Hypothyroidism    SDH (subdural hematoma) ([facility] 11/30/2020   Stroke ([facility] [facility]    2 TIAs   Thyroid disease    hypothyroid   Past Surgical History:  Procedure Laterality Date   APPENDECTOMY     BURR HOLE Right 02/07/2021   Procedure: Right Burr holes for subdural hematoma;  Surgeon: CAshok Pall MD;  [facility]  Service: Neurosurgery;  Laterality: Right;  Right Burr holes for subdural hematoma   CHOLECYSTECTOMY N/A 05/09/2022    Procedure: LAPAROSCOPIC CHOLECYSTECTOMY WITH ICG DYE;  Surgeon: Kinsinger, LArta Bruce MD;  [place] [facility]  Service: General;  Laterality: N/A;   COLONOSCOPY  10/02/2006, 12/18/2011   ESOPHAGOSCOPY  12/18/2011   with MVenia Minksdilation   KNEE ARTHROSCOPY     both knees   SHOULDER ARTHROSCOPY     left shoulder, both knees   SHOULDER SURGERY     right surgery   TONSILLECTOMY     UPPER GASTROINTESTINAL ENDOSCOPY  02/04/2009   with MVenia Minksdilation   Social History   Social History Narrative   Married his wife is an invalid and he provides full-time care   No alcohol no drugs and never smoker   family history includes Mesothelioma in his paternal aunt and paternal uncle.   Review of Systems As per  HPI  Objective:   Physical Exam BP 130/70   Pulse (!) 55   Ht '5\' 10"'$  (1.778 m)   Wt 213 lb 3.2 oz (96.7 kg)   BMI 30.59 kg/m  Elderly wm NAD Lungs cta Cor NL Abd soft, mod obese, nt w/o hsm/mass

## 2022-10-26 NOTE — Patient Instructions (Signed)
Your provider has requested that you go to the basement level for lab work before leaving today. Press "B" on the elevator. The lab is located at the first door on the left as you exit the elevator.   Due to recent changes in healthcare laws, you may see the results of your imaging and laboratory studies on MyChart before your provider has had a chance to review them.  We understand that in some cases there may be results that are confusing or concerning to you. Not all laboratory results come back in the same time frame and the provider may be waiting for multiple results in order to interpret others.  Please give [place] 48 hours in order for your provider to thoroughly review all the results before contacting the office for clarification of your results.    We have sent the following medications to your pharmacy for you to pick up at your convenience: Cholestyramine   You will be contacted by [facility] in the next 2 days to arrange a complete abdominal U/S.[place] Kitchen  The number on your caller ID will be [phone], please answer when they call.  If you have not heard from them in 2 days please call [phone] to schedule.     I appreciate the opportunity to care for you. Silvano Rusk M.D.

## 2022-10-27 ENCOUNTER — Other Ambulatory Visit: Payer: Self-pay | Admitting: Internal Medicine

## 2022-10-27 DIAGNOSIS — E871 Hypo-osmolality and hyponatremia: Secondary | ICD-10-CM

## 2022-10-30 ENCOUNTER — Other Ambulatory Visit (INDEPENDENT_AMBULATORY_CARE_PROVIDER_SITE_OTHER): Payer: Medicare Other

## 2022-10-30 ENCOUNTER — Telehealth: Payer: Self-pay | Admitting: Internal Medicine

## 2022-10-30 DIAGNOSIS — E871 Hypo-osmolality and hyponatremia: Secondary | ICD-10-CM | POA: Diagnosis not present

## 2022-10-30 LAB — BASIC METABOLIC PANEL
BUN: 11 mg/dL (ref 6–23)
CO2: 27 mEq/L (ref 19–32)
Calcium: 9 mg/dL (ref 8.4–10.5)
Chloride: 95 mEq/L — ABNORMAL LOW (ref 96–112)
Creatinine, Ser: 0.96 mg/dL (ref 0.40–1.50)
GFR: 75.48 mL/min (ref >60.00)
Glucose, Bld: 149 mg/dL — ABNORMAL HIGH (ref 70–99)
Potassium: 3.3 mEq/L — ABNORMAL LOW (ref 3.5–5.1)
Sodium: 131 mEq/L — ABNORMAL LOW (ref 135–145)

## 2022-10-30 NOTE — Telephone Encounter (Signed)
Patient calling stating he has done his labs. But would like a f/u call on having constipation due to being prescribed Chlolestyrmine medication. Please advise.  Thank you

## 2022-10-30 NOTE — Telephone Encounter (Signed)
Patient called to follow up on previous message seeking advise.

## 2022-10-31 NOTE — Telephone Encounter (Signed)
Pt was contacted for symptom update: Pt stated that he had a good BM yesterday and a good BM this AM. No constipation, no pain, no complaints:

## 2022-11-03 ENCOUNTER — Ambulatory Visit (HOSPITAL_COMMUNITY)
Admission: RE | Admit: 2022-11-03 | Discharge: 2022-11-03 | Disposition: A | Discharge status: Home / Self Care | Payer: Medicare Other | Source: Ambulatory Visit | Attending: Internal Medicine | Admitting: Internal Medicine

## 2022-11-03 DIAGNOSIS — R1033 Periumbilical pain: Secondary | ICD-10-CM | POA: Insufficient documentation

## 2022-11-03 DIAGNOSIS — R1013 Epigastric pain: Secondary | ICD-10-CM | POA: Diagnosis present

## 2022-11-06 ENCOUNTER — Telehealth: Payer: Self-pay | Admitting: Internal Medicine

## 2022-11-06 NOTE — Telephone Encounter (Signed)
Patient called seeking [place] results.

## 2022-11-07 NOTE — Telephone Encounter (Signed)
Pt questioned the results from his recent US: Pt was notified that as soon as Dr. Carlean Purl reviews the [place] then our office will reach out to him and make him aware of the results:  Pt verbalized understanding with all questions answered.

## 2022-11-08 NOTE — Telephone Encounter (Signed)
PT is calling to discuss results. Advised that Dr. Carlean Purl will call once received, he still wants to speak to someone. Please advise.

## 2022-12-06 ENCOUNTER — Telehealth: Payer: Self-pay | Admitting: Internal Medicine

## 2022-12-06 NOTE — Telephone Encounter (Signed)
Patient is calling states he is constipated and would like to speak with a nurse on what to do. Please advise

## 2022-12-06 NOTE — Telephone Encounter (Signed)
Pt stated that he has not had a BM in 4 days: Pt stated that he has been having issues off and on with Constipation: Pt stated that he takes stool softeners sometimes: Pt stated that he has not been taking any Miralax: Pt was recommended to try Miralax twice daily until a large BM and then once a day until he feels that his BM are regular: Pt was notified to try this recommendation and call office back if he remains constipated:  Pt verbalized understanding with all questions answered.

## 2023-05-22 ENCOUNTER — Telehealth: Payer: Self-pay | Admitting: Internal Medicine

## 2023-05-22 MED ORDER — CHOLESTYRAMINE LIGHT 4 GM/DOSE PO POWD: 4.0000 g | Freq: Every day | ORAL | 1 refills | Status: DC

## 2023-05-22 NOTE — Telephone Encounter (Signed)
Inbound call from patient stating he has episode of sever diarrhea that come with severe abdominal pain. Requesting a call back to discuss options that could help when the episodes do happen. Please advise, thank you.

## 2023-05-22 NOTE — Telephone Encounter (Signed)
Spoke to patient who states that at the beginning of the month, he went on an outing at [employer] and had "greasy pizza and salad" that caused him severe diarrhea episode with incontinence. Patient states he has previously been told by Dr Leone Payor to eat a low fiber diet and avoid known triggers for diarrhea, but doesn't always follow this advice. He says that today, he ate extra bran on his cereal and again had diarrhea with incontinence. Seeking advice on what to do to prevent this problem. I advised that he should remain on a low fiber, low fat diet and avoid known food triggers as previously instructed by Dr Leone Payor. Also inquired about whether the patient was taking cholestyramine and prescribed at his last visit 10/2022. Patient states that he doesn't think he ever picked up this prescription. I have advised that I will resend a prescription of this to the pharmacy for him and that he should be certain to take this medication at least 1 hour apart from all other medications since there are a number of interactions that could occur. Patient verbalizes understanding. He is asked to call back if this medication and altered diet are ineffective or if his symptoms worsen.

## 2023-07-14 ENCOUNTER — Other Ambulatory Visit: Payer: Self-pay | Admitting: Internal Medicine

## 2024-04-07 ENCOUNTER — Telehealth: Payer: Self-pay | Admitting: Internal Medicine

## 2024-04-07 NOTE — Telephone Encounter (Signed)
 Inbound call from patient, states he believes he had an attack last night, he states he was in and out of the bathroom for 4 hours. He states he believed his "stomach was killing me" patient states he has been cold since the episode and is unsure of the reasoning.

## 2024-04-08 NOTE — Telephone Encounter (Signed)
 Left message on machine to call back

## 2024-04-09 NOTE — Telephone Encounter (Signed)
 Left message on machine to call back

## 2024-04-10 NOTE — Telephone Encounter (Signed)
 Returned pt call x 3 with no response. Will await further communication from the pt.

## 2024-09-01 ENCOUNTER — Other Ambulatory Visit: Payer: Self-pay | Admitting: Internal Medicine

## 2024-11-19 ENCOUNTER — Other Ambulatory Visit: Payer: Self-pay | Admitting: Neurosurgery

## 2024-11-19 DIAGNOSIS — S065XAA Traumatic subdural hemorrhage with loss of consciousness status unknown, initial encounter: Secondary | ICD-10-CM

## 2024-11-28 ENCOUNTER — Inpatient Hospital Stay: Admission: RE | Admit: 2024-11-28 | Discharge: 2024-11-28 | Attending: Neurosurgery | Admitting: Neurosurgery

## 2024-11-28 DIAGNOSIS — S065XAA Traumatic subdural hemorrhage with loss of consciousness status unknown, initial encounter: Secondary | ICD-10-CM

## 2024-11-29 ENCOUNTER — Emergency Department (HOSPITAL_BASED_OUTPATIENT_CLINIC_OR_DEPARTMENT_OTHER)
Admission: EM | Admit: 2024-11-29 | Discharge: 2024-11-30 | Disposition: A | Discharge status: Home / Self Care | Attending: Emergency Medicine | Admitting: Emergency Medicine

## 2024-11-29 ENCOUNTER — Other Ambulatory Visit: Payer: Self-pay

## 2024-11-29 ENCOUNTER — Encounter (HOSPITAL_BASED_OUTPATIENT_CLINIC_OR_DEPARTMENT_OTHER): Payer: Self-pay

## 2024-11-29 ENCOUNTER — Emergency Department (HOSPITAL_BASED_OUTPATIENT_CLINIC_OR_DEPARTMENT_OTHER)

## 2024-11-29 DIAGNOSIS — Z7901 Long term (current) use of anticoagulants: Secondary | ICD-10-CM | POA: Insufficient documentation

## 2024-11-29 DIAGNOSIS — Z85828 Personal history of other malignant neoplasm of skin: Secondary | ICD-10-CM | POA: Insufficient documentation

## 2024-11-29 DIAGNOSIS — I5022 Chronic systolic (congestive) heart failure: Secondary | ICD-10-CM | POA: Insufficient documentation

## 2024-11-29 DIAGNOSIS — R059 Cough, unspecified: Secondary | ICD-10-CM | POA: Diagnosis present

## 2024-11-29 DIAGNOSIS — I110 Hypertensive heart disease with heart failure: Secondary | ICD-10-CM | POA: Diagnosis not present

## 2024-11-29 DIAGNOSIS — E876 Hypokalemia: Secondary | ICD-10-CM | POA: Diagnosis not present

## 2024-11-29 DIAGNOSIS — J189 Pneumonia, unspecified organism: Secondary | ICD-10-CM | POA: Insufficient documentation

## 2024-11-29 DIAGNOSIS — Z79899 Other long term (current) drug therapy: Secondary | ICD-10-CM | POA: Diagnosis not present

## 2024-11-29 DIAGNOSIS — E039 Hypothyroidism, unspecified: Secondary | ICD-10-CM | POA: Insufficient documentation

## 2024-11-29 DIAGNOSIS — Z8673 Personal history of transient ischemic attack (TIA), and cerebral infarction without residual deficits: Secondary | ICD-10-CM | POA: Insufficient documentation

## 2024-11-29 LAB — RESP PANEL BY RT-PCR (RSV, FLU A&B, COVID)  RVPGX2
Influenza A by PCR: NEGATIVE
Influenza B by PCR: NEGATIVE
Resp Syncytial Virus by PCR: NEGATIVE
SARS Coronavirus 2 by RT PCR: NEGATIVE

## 2024-11-29 MED ORDER — SODIUM CHLORIDE 0.9 % IV SOLN
1.0000 g | Freq: Once | INTRAVENOUS | Status: AC
  Administered 2024-11-29: 1 g via INTRAVENOUS
  Filled 2024-11-29: qty 10

## 2024-11-29 MED ORDER — SODIUM CHLORIDE 0.9 % IV SOLN
500.0000 mg | Freq: Once | INTRAVENOUS | Status: AC
  Administered 2024-11-30: 500 mg via INTRAVENOUS
  Filled 2024-11-29: qty 5

## 2024-11-29 NOTE — ED Provider Notes (Signed)
 " [facility] Provider Note   CSN: [id number] Arrival date & time: 11/29/24  2011     Patient presents with: Fever   John Mack is a 83 y.o. male.  {Add pertinent medical, surgical, social history, OB history to HPI:32947} HPI     83yo male with history of CHF, hypothyroidism, hypertension, hyperlipidemia   3 daysa go started feeling badly 2 nights coughed all night long Don't remember being sick like this and is 83 years old Feels sick, doesn't feel good Staying awake 2 nights coughing and sweating Dr. 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 Authorizing Provider  acetaminophen  (TYLENOL ) 325 MG tablet Take 2 tablets (650 mg total) by mouth every 4 (four) hours as needed for mild pain (temp > 100.5). 02/21/21   Angiulli, Toribio PARAS, PA-C  Albuterol  Sulfate, sensor, (PROAIR  DIGIHALER) 108 (90 Base) MCG/ACT AEPB Inhale 1 puff into the lungs every 6 (six) hours as needed (shortness of  breath). 02/21/21   Angiulli, Toribio PARAS, PA-C  alprazolam  (XANAX ) 2 MG tablet Take 2 mg by mouth at bedtime. 02/25/21   [provider]  amiodarone  (PACERONE ) 200 MG tablet Take 1 tablet (200 mg total) by mouth daily. 02/21/21   Angiulli, Toribio PARAS, PA-C  amLODipine  (NORVASC ) 2.5 MG tablet Take 1 tablet (2.5 mg total) by mouth daily. 02/21/21   Angiulli, Toribio PARAS, PA-C  apixaban  (ELIQUIS ) 5 MG TABS tablet Take 1 tablet (5 mg total) by mouth 2 (two) times daily. 02/21/21   Angiulli, Toribio PARAS, PA-C  budesonide -formoterol  (SYMBICORT ) 160-4.5 MCG/ACT inhaler Inhale 2 puffs into the lungs 2 (two) times daily. 04/18/15   [provider]  cholestyramine  light (PREVALITE ) 4 GM/DOSE powder TAKE 1 SCOOP (4 G TOTAL) BY MOUTH DAILY WITH LUNCH. 09/01/24   Avram Lupita BRAVO, MD  cyanocobalamin (,VITAMIN B-12,) 1000 MCG/ML injection Inject 1,000 mcg into the muscle See admin instructions. Inject 1 mL (1000mcg) intramuscularly every 30 days as needed for vitamin B12/energy. 01/31/21   [provider]  esomeprazole  (NEXIUM ) 40 MG capsule Take 1 capsule (40 mg total) by mouth 2 (two) times daily. 02/21/21   Angiulli, Toribio PARAS, PA-C  hydrochlorothiazide  (HYDRODIURIL ) 25 MG tablet Take 25 mg by mouth every morning. 04/27/22   [provider]  levothyroxine  (SYNTHROID )  137 MCG tablet Take 137 mcg by mouth every morning. 04/03/22   [provider]  LORazepam  (ATIVAN ) 1 MG tablet Take 0.5-1 mg by mouth 2 (two) times daily as needed for anxiety. 11/03/19   [provider]  metoprolol  succinate (TOPROL -XL) 50 MG 24 hr tablet Take 50 mg by mouth 2 (two) times daily. 09/15/21   [provider]  montelukast  (SINGULAIR ) 10 MG tablet Take 10 mg by mouth daily. 02/21/21   [provider]  Multiple Vitamins-Minerals (MULTIVITAMIN PO) Take 1 tablet by mouth daily.     [provider]  oxyCODONE  (OXY IR/ROXICODONE ) 5 MG immediate release tablet Take 1 tablet (5 mg total)  by mouth every 6 (six) hours as needed for severe pain. 05/10/22   Kinsinger, Herlene Righter, MD  Polyethyl Glycol-Propyl Glycol (SYSTANE OP) Place 1 drop into both eyes daily as needed (dry eyes).    [provider]  pravastatin (PRAVACHOL) 40 MG tablet Take 40 mg by mouth daily. 12/28/20   [provider]  testosterone cypionate (DEPOTESTOSTERONE CYPIONATE) 200 MG/ML injection Inject 200 mg into the muscle every 14 (fourteen) days.    [provider]  Tiotropium Bromide  Monohydrate (SPIRIVA  RESPIMAT) 1.25 MCG/ACT AERS Inhale 2 puffs into the lungs daily. 03/17/21   Hunsucker, Donnice SAUNDERS, MD    Allergies: Patient has no known allergies.    Review of Systems  Updated Vital Signs BP 124/72   Pulse (!) 57   Temp 98.5 F (36.9 C) (Oral)   Resp 18   Ht 5' 10 (1.778 m)   Wt 90.7 kg   SpO2 98%   BMI 28.70 kg/m   Physical Exam  (all labs ordered are listed, but only abnormal results are displayed) Labs Reviewed  RESP PANEL BY RT-PCR (RSV, FLU A&B, COVID)  [id number]    EKG: None  Radiology: [facility] Chest Port 1 View Result Date: 11/29/2024 CLINICAL DATA:  Shortness of breath.  Fever. EXAM: PORTABLE CHEST 1 VIEW COMPARISON:  05/07/2022 FINDINGS: Chronic cardiomegaly. Unchanged mediastinal contours, aortic atherosclerosis. Mild patchy opacity in the periphery of the left mid lung. The right lung is clear. No pulmonary edema, large pleural effusion or pneumothorax. No acute osseous findings. IMPRESSION: 1. Mild patchy opacity in the periphery of the left mid lung, suspicious for pneumonia. 2. Chronic cardiomegaly. Electronically Signed   By: Andrea Gasman M.D.   On: 11/29/2024 21:29    {Document cardiac monitor, telemetry assessment procedure when appropriate:32947} Procedures   Medications Ordered in the ED - No data to display    {Click here for ABCD2, HEART and other calculators REFRESH Note before signing:1}                              Medical Decision  Making Amount and/or Complexity of Data Reviewed Radiology: ordered.   ***  {Document critical care time when appropriate  Document review of labs and clinical decision tools ie [id number], etc  Document your independent review of radiology images and any outside records  Document your discussion with family members, caretakers and with consultants  Document social determinants of health affecting pt's care  Document your decision making why or why not admission, treatments were needed:32947:::1}   Final diagnoses:  None    ED Discharge Orders     None        "

## 2024-11-29 NOTE — ED Notes (Signed)
 X-ray at bedside.

## 2024-11-29 NOTE — ED Notes (Signed)
 ED Provider at bedside.

## 2024-11-29 NOTE — ED Notes (Signed)
 Blood cultures drawn x2 before starting antibiotics.

## 2024-11-29 NOTE — ED Triage Notes (Signed)
 Pt brought in by EMS. Pt c/o fever, flu like S/S for several days. Pt took 1G Tylenol  w/ EM.

## 2024-11-30 DIAGNOSIS — J189 Pneumonia, unspecified organism: Secondary | ICD-10-CM | POA: Diagnosis not present

## 2024-11-30 LAB — CBC WITH DIFFERENTIAL/PLATELET
Abs Immature Granulocytes: 0.01 K/uL (ref 0.00–0.07)
Basophils Absolute: 0 K/uL (ref 0.0–0.1)
Basophils Relative: 0 %
Eosinophils Absolute: 0 K/uL (ref 0.0–0.5)
Eosinophils Relative: 0 %
HCT: 39.4 % (ref 39.0–52.0)
Hemoglobin: 13.4 g/dL (ref 13.0–17.0)
Immature Granulocytes: 0 %
Lymphocytes Relative: 14 %
Lymphs Abs: 0.8 K/uL (ref 0.7–4.0)
MCH: 27.5 pg (ref 26.0–34.0)
MCHC: 34 g/dL (ref 30.0–36.0)
MCV: 80.7 fL (ref 80.0–100.0)
Monocytes Absolute: 1 K/uL (ref 0.1–1.0)
Monocytes Relative: 18 %
Neutro Abs: 3.6 K/uL (ref 1.7–7.7)
Neutrophils Relative %: 68 %
Platelets: 228 K/uL (ref 150–400)
RBC: 4.88 MIL/uL (ref 4.22–5.81)
RDW: 15 % (ref 11.5–15.5)
WBC: 5.4 K/uL (ref 4.0–10.5)
nRBC: 0 % (ref 0.0–0.2)

## 2024-11-30 LAB — COMPREHENSIVE METABOLIC PANEL WITH GFR
ALT: 13 U/L (ref 0–44)
AST: 34 U/L (ref 15–41)
Albumin: 4 g/dL (ref 3.5–5.0)
Alkaline Phosphatase: 43 U/L (ref 38–126)
Anion gap: 12 (ref 5–15)
BUN: 15 mg/dL (ref 8–23)
CO2: 26 mmol/L (ref 22–32)
Calcium: 8.7 mg/dL — ABNORMAL LOW (ref 8.9–10.3)
Chloride: 89 mmol/L — ABNORMAL LOW (ref 98–111)
Creatinine, Ser: 0.89 mg/dL (ref 0.61–1.24)
GFR, Estimated: >60 mL/min
Glucose, Bld: 107 mg/dL — ABNORMAL HIGH (ref 70–99)
Potassium: 2.8 mmol/L — ABNORMAL LOW (ref 3.5–5.1)
Sodium: 127 mmol/L — ABNORMAL LOW (ref 135–145)
Total Bilirubin: 0.6 mg/dL (ref 0.0–1.2)
Total Protein: 6.6 g/dL (ref 6.5–8.1)

## 2024-11-30 LAB — MAGNESIUM: Magnesium: 1.9 mg/dL (ref 1.7–2.4)

## 2024-11-30 LAB — LACTIC ACID, PLASMA: Lactic Acid, Venous: 1.1 mmol/L (ref 0.5–1.9)

## 2024-11-30 MED ORDER — AZITHROMYCIN 250 MG PO TABS: 250.0000 mg | ORAL_TABLET | Freq: Every day | ORAL | 0 refills | Status: AC

## 2024-11-30 MED ORDER — POTASSIUM CHLORIDE CRYS ER 20 MEQ PO TBCR
40.0000 meq | EXTENDED_RELEASE_TABLET | Freq: Once | ORAL | Status: AC
  Administered 2024-11-30: 40 meq via ORAL
  Filled 2024-11-30: qty 2

## 2024-11-30 MED ORDER — POTASSIUM CHLORIDE CRYS ER 20 MEQ PO TBCR: 20.0000 meq | EXTENDED_RELEASE_TABLET | Freq: Two times a day (BID) | ORAL | 0 refills | Status: AC

## 2024-11-30 MED ORDER — POTASSIUM CHLORIDE 10 MEQ/100ML IV SOLN: 10.0000 meq | INTRAVENOUS | Status: DC

## 2024-11-30 NOTE — ED Provider Notes (Signed)
" °  Physical Exam  BP 126/75   Pulse (!) 55   Temp 98.5 F (36.9 C) (Oral)   Resp 18   Ht 5' 10 (1.778 m)   Wt 90.7 kg   SpO2 97%   BMI 28.70 kg/m   Physical Exam Vitals and nursing note reviewed.  Constitutional:      Appearance: Normal appearance.  HENT:     Head: Normocephalic.  Pulmonary:     Effort: Pulmonary effort is normal.  Skin:    General: Skin is warm and dry.  Neurological:     Mental Status: He is alert and oriented to person, place, and time.     Procedures  Procedures  ED Course / MDM    Medical Decision Making Amount and/or Complexity of Data Reviewed Labs: ordered. Radiology: ordered.  Risk Prescription drug management.   Care assumed from Dr. Dreama at shift change.  Patient awaiting reassessment after receiving antibiotics.  He appears to have community-acquired pneumonia based on his chest x-ray.  Laboratory studies thus far have been reassuring and patient is not requiring oxygen.  Upon reassessment, he feels well and believes that he can safely go home.  I will prescribe Zithromax  and have him follow-up as needed.       Geroldine Berg, MD 11/30/24 [phone]  "

## 2024-11-30 NOTE — Discharge Instructions (Addendum)
 Begin taking Zithromax  as prescribed.  Begin taking potassium as prescribed.  Take over-the-counter medications as needed for relief of symptoms.  Follow-up with primary doctor in the next week, and return to the ER if symptoms significantly worsen or change.

## 2024-11-30 NOTE — ED Notes (Signed)
 John Mack called via family. Assited into vehicle.

## 2024-12-05 LAB — CULTURE, BLOOD (ROUTINE X 2)
Culture: NO GROWTH
Culture: NO GROWTH
Special Requests: ADEQUATE
Special Requests: ADEQUATE
